# Patient Record
Sex: Female | Born: 1935 | Race: White | Hispanic: No | Smoking: Never smoker
Health system: Southern US, Community
[De-identification: ages and names within clinical notes are randomized; demographics above are authoritative.]

## PROBLEM LIST (undated history)

## (undated) DIAGNOSIS — M75100 Unspecified rotator cuff tear or rupture of unspecified shoulder, not specified as traumatic: Secondary | ICD-10-CM

## (undated) DIAGNOSIS — I4892 Unspecified atrial flutter: Secondary | ICD-10-CM

## (undated) DIAGNOSIS — R1013 Epigastric pain: Secondary | ICD-10-CM

## (undated) DIAGNOSIS — E079 Disorder of thyroid, unspecified: Secondary | ICD-10-CM

## (undated) DIAGNOSIS — I340 Nonrheumatic mitral (valve) insufficiency: Secondary | ICD-10-CM

## (undated) DIAGNOSIS — I150 Renovascular hypertension: Secondary | ICD-10-CM

## (undated) DIAGNOSIS — I519 Heart disease, unspecified: Secondary | ICD-10-CM

## (undated) DIAGNOSIS — I739 Peripheral vascular disease, unspecified: Secondary | ICD-10-CM

## (undated) DIAGNOSIS — D649 Anemia, unspecified: Secondary | ICD-10-CM

## (undated) DIAGNOSIS — K449 Diaphragmatic hernia without obstruction or gangrene: Secondary | ICD-10-CM

## (undated) DIAGNOSIS — N189 Chronic kidney disease, unspecified: Secondary | ICD-10-CM

## (undated) DIAGNOSIS — I639 Cerebral infarction, unspecified: Secondary | ICD-10-CM

## (undated) DIAGNOSIS — I10 Essential (primary) hypertension: Secondary | ICD-10-CM

## (undated) DIAGNOSIS — M1990 Unspecified osteoarthritis, unspecified site: Secondary | ICD-10-CM

## (undated) HISTORY — PX: OTHER SURGICAL HISTORY: SHX169

## (undated) HISTORY — PX: EYE SURGERY: SHX253

## (undated) HISTORY — DX: Peripheral vascular disease, unspecified: I73.9

## (undated) HISTORY — DX: Diaphragmatic hernia without obstruction or gangrene: K44.9

## (undated) HISTORY — PX: CARDIAC CATHETERIZATION: SHX172

## (undated) HISTORY — PX: TONSILLECTOMY: SUR1361

## (undated) HISTORY — DX: Epigastric pain: R10.13

## (undated) HISTORY — DX: Heart disease, unspecified: I51.9

## (undated) HISTORY — DX: Disorder of thyroid, unspecified: E07.9

## (undated) HISTORY — DX: Unspecified osteoarthritis, unspecified site: M19.90

## (undated) HISTORY — DX: Anemia, unspecified: D64.9

## (undated) HISTORY — DX: Nonrheumatic mitral (valve) insufficiency: I34.0

## (undated) HISTORY — PX: CHOLECYSTECTOMY: SHX55

## (undated) HISTORY — PX: OOPHORECTOMY: SHX86

## (undated) HISTORY — PX: ABDOMINAL HYSTERECTOMY: SHX81

## (undated) HISTORY — DX: Cerebral infarction, unspecified: I63.9

## (undated) HISTORY — DX: Chronic kidney disease, unspecified: N18.9

## (undated) HISTORY — DX: Renovascular hypertension: I15.0

## (undated) HISTORY — DX: Essential (primary) hypertension: I10

## (undated) HISTORY — DX: Unspecified atrial flutter: I48.92

## (undated) HISTORY — DX: Unspecified rotator cuff tear or rupture of unspecified shoulder, not specified as traumatic: M75.100

---

## 1997-07-15 ENCOUNTER — Other Ambulatory Visit: Admission: RE | Admit: 1997-07-15 | Discharge: 1997-07-15 | Payer: Self-pay

## 1997-09-17 ENCOUNTER — Other Ambulatory Visit: Admission: RE | Admit: 1997-09-17 | Discharge: 1997-09-17 | Payer: Self-pay | Admitting: Obstetrics and Gynecology

## 1997-09-17 ENCOUNTER — Ambulatory Visit (HOSPITAL_COMMUNITY): Admission: RE | Admit: 1997-09-17 | Discharge: 1997-09-17 | Payer: Self-pay | Admitting: Family Medicine

## 1998-09-22 ENCOUNTER — Ambulatory Visit (HOSPITAL_COMMUNITY): Admission: RE | Admit: 1998-09-22 | Discharge: 1998-09-22 | Payer: Self-pay | Admitting: Obstetrics and Gynecology

## 1998-09-22 ENCOUNTER — Encounter: Payer: Self-pay | Admitting: Obstetrics and Gynecology

## 1998-09-22 ENCOUNTER — Other Ambulatory Visit: Admission: RE | Admit: 1998-09-22 | Discharge: 1998-09-22 | Payer: Self-pay | Admitting: Obstetrics and Gynecology

## 2000-04-02 ENCOUNTER — Encounter: Payer: Self-pay | Admitting: Family Medicine

## 2000-04-02 ENCOUNTER — Ambulatory Visit (HOSPITAL_COMMUNITY): Admission: RE | Admit: 2000-04-02 | Discharge: 2000-04-02 | Payer: Self-pay | Admitting: Family Medicine

## 2000-10-24 ENCOUNTER — Encounter: Payer: Self-pay | Admitting: Gastroenterology

## 2002-04-02 ENCOUNTER — Ambulatory Visit (HOSPITAL_COMMUNITY): Admission: RE | Admit: 2002-04-02 | Discharge: 2002-04-02 | Payer: Self-pay | Admitting: Family Medicine

## 2002-04-02 ENCOUNTER — Encounter: Payer: Self-pay | Admitting: Family Medicine

## 2003-01-17 HISTORY — PX: OTHER SURGICAL HISTORY: SHX169

## 2003-04-21 ENCOUNTER — Ambulatory Visit (HOSPITAL_COMMUNITY): Admission: RE | Admit: 2003-04-21 | Discharge: 2003-04-21 | Payer: Self-pay | Admitting: Internal Medicine

## 2003-05-21 ENCOUNTER — Encounter: Admission: RE | Admit: 2003-05-21 | Discharge: 2003-05-21 | Payer: Self-pay | Admitting: Internal Medicine

## 2003-07-28 ENCOUNTER — Ambulatory Visit (HOSPITAL_COMMUNITY): Admission: RE | Admit: 2003-07-28 | Discharge: 2003-07-29 | Payer: Self-pay | Admitting: Cardiology

## 2003-10-12 ENCOUNTER — Encounter: Payer: Self-pay | Admitting: Gastroenterology

## 2003-12-23 ENCOUNTER — Ambulatory Visit: Payer: Self-pay | Admitting: Internal Medicine

## 2004-01-06 ENCOUNTER — Ambulatory Visit: Payer: Self-pay | Admitting: Internal Medicine

## 2004-01-13 ENCOUNTER — Ambulatory Visit: Payer: Self-pay | Admitting: Internal Medicine

## 2004-01-14 ENCOUNTER — Encounter: Admission: RE | Admit: 2004-01-14 | Discharge: 2004-01-14 | Payer: Self-pay | Admitting: Internal Medicine

## 2004-01-19 ENCOUNTER — Ambulatory Visit: Payer: Self-pay | Admitting: Cardiology

## 2004-02-25 ENCOUNTER — Ambulatory Visit (HOSPITAL_COMMUNITY): Admission: RE | Admit: 2004-02-25 | Discharge: 2004-02-25 | Payer: Self-pay | Admitting: Internal Medicine

## 2004-02-25 ENCOUNTER — Ambulatory Visit: Payer: Self-pay | Admitting: Internal Medicine

## 2004-03-03 ENCOUNTER — Ambulatory Visit: Payer: Self-pay | Admitting: Internal Medicine

## 2004-03-10 ENCOUNTER — Ambulatory Visit: Payer: Self-pay | Admitting: Internal Medicine

## 2004-04-07 ENCOUNTER — Ambulatory Visit: Payer: Self-pay | Admitting: Internal Medicine

## 2004-04-27 ENCOUNTER — Ambulatory Visit (HOSPITAL_COMMUNITY): Admission: RE | Admit: 2004-04-27 | Discharge: 2004-04-27 | Payer: Self-pay | Admitting: Internal Medicine

## 2004-06-10 ENCOUNTER — Ambulatory Visit: Payer: Self-pay | Admitting: Internal Medicine

## 2004-06-29 ENCOUNTER — Ambulatory Visit: Payer: Self-pay | Admitting: Internal Medicine

## 2004-07-05 ENCOUNTER — Ambulatory Visit: Payer: Self-pay | Admitting: Internal Medicine

## 2004-07-27 ENCOUNTER — Ambulatory Visit: Payer: Self-pay | Admitting: Cardiology

## 2004-08-05 ENCOUNTER — Ambulatory Visit: Payer: Self-pay | Admitting: Cardiology

## 2004-09-06 ENCOUNTER — Ambulatory Visit: Payer: Self-pay | Admitting: Cardiology

## 2004-09-13 ENCOUNTER — Ambulatory Visit: Payer: Self-pay | Admitting: Internal Medicine

## 2004-09-22 ENCOUNTER — Ambulatory Visit: Payer: Self-pay | Admitting: Internal Medicine

## 2004-11-01 ENCOUNTER — Ambulatory Visit: Payer: Self-pay | Admitting: Internal Medicine

## 2004-11-17 ENCOUNTER — Ambulatory Visit: Payer: Self-pay | Admitting: Internal Medicine

## 2005-03-31 ENCOUNTER — Ambulatory Visit: Payer: Self-pay | Admitting: Cardiology

## 2005-05-02 ENCOUNTER — Ambulatory Visit: Payer: Self-pay

## 2005-05-20 ENCOUNTER — Ambulatory Visit: Payer: Self-pay | Admitting: Family Medicine

## 2005-05-22 ENCOUNTER — Ambulatory Visit: Payer: Self-pay | Admitting: Internal Medicine

## 2005-06-05 ENCOUNTER — Ambulatory Visit: Payer: Self-pay | Admitting: Internal Medicine

## 2005-06-16 DIAGNOSIS — I639 Cerebral infarction, unspecified: Secondary | ICD-10-CM

## 2005-06-16 HISTORY — DX: Cerebral infarction, unspecified: I63.9

## 2005-06-21 ENCOUNTER — Ambulatory Visit (HOSPITAL_COMMUNITY): Admission: RE | Admit: 2005-06-21 | Discharge: 2005-06-21 | Payer: Self-pay | Admitting: Internal Medicine

## 2005-06-21 ENCOUNTER — Ambulatory Visit: Payer: Self-pay | Admitting: Internal Medicine

## 2005-07-03 ENCOUNTER — Ambulatory Visit: Payer: Self-pay | Admitting: Internal Medicine

## 2005-07-17 ENCOUNTER — Ambulatory Visit: Payer: Self-pay

## 2005-07-19 ENCOUNTER — Inpatient Hospital Stay (HOSPITAL_COMMUNITY): Admission: EM | Admit: 2005-07-19 | Discharge: 2005-07-22 | Payer: Self-pay | Admitting: Emergency Medicine

## 2005-07-20 ENCOUNTER — Ambulatory Visit: Payer: Self-pay | Admitting: Internal Medicine

## 2005-07-20 ENCOUNTER — Encounter (INDEPENDENT_AMBULATORY_CARE_PROVIDER_SITE_OTHER): Payer: Self-pay | Admitting: *Deleted

## 2005-08-29 ENCOUNTER — Ambulatory Visit: Payer: Self-pay | Admitting: Cardiology

## 2005-09-08 ENCOUNTER — Ambulatory Visit: Payer: Self-pay | Admitting: Cardiology

## 2005-09-08 ENCOUNTER — Ambulatory Visit (HOSPITAL_COMMUNITY): Admission: RE | Admit: 2005-09-08 | Discharge: 2005-09-08 | Payer: Self-pay | Admitting: Cardiology

## 2005-11-06 ENCOUNTER — Ambulatory Visit: Payer: Self-pay

## 2005-12-05 ENCOUNTER — Ambulatory Visit: Payer: Self-pay | Admitting: Cardiology

## 2006-04-03 ENCOUNTER — Ambulatory Visit: Payer: Self-pay

## 2006-05-21 ENCOUNTER — Ambulatory Visit: Payer: Self-pay | Admitting: Cardiology

## 2006-05-21 LAB — CONVERTED CEMR LAB
BUN: 41 mg/dL — ABNORMAL HIGH (ref 6–23)
CO2: 23 meq/L (ref 19–32)
Chloride: 108 meq/L (ref 96–112)
Glucose, Bld: 111 mg/dL — ABNORMAL HIGH (ref 70–99)
Sodium: 138 meq/L (ref 135–145)

## 2006-06-14 ENCOUNTER — Ambulatory Visit: Payer: Self-pay | Admitting: Internal Medicine

## 2006-06-21 ENCOUNTER — Ambulatory Visit: Payer: Self-pay

## 2006-06-21 ENCOUNTER — Ambulatory Visit: Payer: Self-pay | Admitting: Cardiology

## 2006-07-17 ENCOUNTER — Ambulatory Visit: Payer: Self-pay

## 2006-07-28 ENCOUNTER — Encounter: Payer: Self-pay | Admitting: Internal Medicine

## 2006-08-07 ENCOUNTER — Ambulatory Visit: Payer: Self-pay | Admitting: Gastroenterology

## 2006-08-07 LAB — CONVERTED CEMR LAB
Folate: >20 ng/mL
Vitamin B-12: 759 pg/mL (ref 211–911)

## 2006-10-04 ENCOUNTER — Encounter: Payer: Self-pay | Admitting: Internal Medicine

## 2006-10-23 ENCOUNTER — Encounter: Payer: Self-pay | Admitting: Internal Medicine

## 2006-10-23 ENCOUNTER — Ambulatory Visit: Payer: Self-pay

## 2006-10-23 ENCOUNTER — Ambulatory Visit: Payer: Self-pay | Admitting: Internal Medicine

## 2006-10-23 ENCOUNTER — Ambulatory Visit: Payer: Self-pay | Admitting: Cardiovascular Disease

## 2006-10-23 DIAGNOSIS — Z8679 Personal history of other diseases of the circulatory system: Secondary | ICD-10-CM | POA: Insufficient documentation

## 2006-10-23 DIAGNOSIS — I10 Essential (primary) hypertension: Secondary | ICD-10-CM | POA: Insufficient documentation

## 2006-10-23 DIAGNOSIS — I2510 Atherosclerotic heart disease of native coronary artery without angina pectoris: Secondary | ICD-10-CM | POA: Insufficient documentation

## 2006-10-25 ENCOUNTER — Ambulatory Visit: Payer: Self-pay | Admitting: Cardiology

## 2006-11-12 ENCOUNTER — Ambulatory Visit: Payer: Self-pay | Admitting: Cardiovascular Disease

## 2006-11-12 LAB — CONVERTED CEMR LAB
Albumin: 3.4 g/dL — ABNORMAL LOW (ref 3.5–5.2)
Alkaline Phosphatase: 82 units/L (ref 39–117)
Direct LDL: 93 mg/dL
HDL: 18.7 mg/dL — ABNORMAL LOW (ref >39.0)
Total Bilirubin: 0.7 mg/dL (ref 0.3–1.2)
Total CHOL/HDL Ratio: 9.4
Total Protein: 7.2 g/dL (ref 6.0–8.3)
Triglycerides: 366 mg/dL (ref 0–149)

## 2006-11-22 ENCOUNTER — Ambulatory Visit: Payer: Self-pay | Admitting: Cardiology

## 2007-01-04 ENCOUNTER — Encounter: Payer: Self-pay | Admitting: Internal Medicine

## 2007-02-14 ENCOUNTER — Ambulatory Visit: Payer: Self-pay | Admitting: Internal Medicine

## 2007-02-14 ENCOUNTER — Ambulatory Visit: Payer: Self-pay | Admitting: Cardiology

## 2007-02-14 DIAGNOSIS — L299 Pruritus, unspecified: Secondary | ICD-10-CM | POA: Insufficient documentation

## 2007-02-14 DIAGNOSIS — I701 Atherosclerosis of renal artery: Secondary | ICD-10-CM | POA: Insufficient documentation

## 2007-02-14 LAB — CONVERTED CEMR LAB
ALT: 19 units/L (ref 0–35)
AST: 21 units/L (ref 0–37)
Bilirubin, Direct: 0.1 mg/dL (ref 0.0–0.3)
Cholesterol: 177 mg/dL (ref 0–200)
Direct LDL: 94.2 mg/dL
HDL: 21 mg/dL — ABNORMAL LOW (ref >39.0)
Total CHOL/HDL Ratio: 8.4
Total Protein: 6.9 g/dL (ref 6.0–8.3)
VLDL: 58 mg/dL — ABNORMAL HIGH (ref 0–40)

## 2007-02-21 ENCOUNTER — Ambulatory Visit: Payer: Self-pay | Admitting: Cardiology

## 2007-02-26 ENCOUNTER — Encounter: Payer: Self-pay | Admitting: Internal Medicine

## 2007-04-17 ENCOUNTER — Ambulatory Visit: Payer: Self-pay

## 2007-04-17 LAB — CONVERTED CEMR LAB
ALT: 13 units/L (ref 0–35)
AST: 18 units/L (ref 0–37)
Bilirubin, Direct: 0.1 mg/dL (ref 0.0–0.3)
Total Bilirubin: 0.6 mg/dL (ref 0.3–1.2)
Total Protein: 7.4 g/dL (ref 6.0–8.3)

## 2007-05-02 ENCOUNTER — Ambulatory Visit: Payer: Self-pay | Admitting: Cardiovascular Disease

## 2007-05-03 DIAGNOSIS — E039 Hypothyroidism, unspecified: Secondary | ICD-10-CM | POA: Insufficient documentation

## 2007-05-03 DIAGNOSIS — K2970 Gastritis, unspecified, without bleeding: Secondary | ICD-10-CM | POA: Insufficient documentation

## 2007-05-03 DIAGNOSIS — K219 Gastro-esophageal reflux disease without esophagitis: Secondary | ICD-10-CM | POA: Insufficient documentation

## 2007-05-03 DIAGNOSIS — K648 Other hemorrhoids: Secondary | ICD-10-CM | POA: Insufficient documentation

## 2007-05-03 DIAGNOSIS — F411 Generalized anxiety disorder: Secondary | ICD-10-CM | POA: Insufficient documentation

## 2007-05-03 DIAGNOSIS — K2990 Gastroduodenitis, unspecified, without bleeding: Secondary | ICD-10-CM

## 2007-05-03 DIAGNOSIS — M1990 Unspecified osteoarthritis, unspecified site: Secondary | ICD-10-CM | POA: Insufficient documentation

## 2007-05-03 DIAGNOSIS — K5730 Diverticulosis of large intestine without perforation or abscess without bleeding: Secondary | ICD-10-CM | POA: Insufficient documentation

## 2007-05-27 ENCOUNTER — Ambulatory Visit: Payer: Self-pay | Admitting: Cardiology

## 2007-05-27 LAB — CONVERTED CEMR LAB
AST: 22 units/L (ref 0–37)
Alkaline Phosphatase: 64 units/L (ref 39–117)
Bilirubin, Direct: 0.1 mg/dL (ref 0.0–0.3)
Total Bilirubin: 0.6 mg/dL (ref 0.3–1.2)
Total CHOL/HDL Ratio: 7.6
VLDL: 44 mg/dL — ABNORMAL HIGH (ref 0–40)

## 2007-05-30 ENCOUNTER — Ambulatory Visit: Payer: Self-pay | Admitting: Internal Medicine

## 2007-07-18 ENCOUNTER — Telehealth: Payer: Self-pay | Admitting: Internal Medicine

## 2007-07-25 ENCOUNTER — Encounter: Payer: Self-pay | Admitting: Internal Medicine

## 2007-08-15 ENCOUNTER — Telehealth: Payer: Self-pay | Admitting: Internal Medicine

## 2007-09-03 ENCOUNTER — Encounter: Payer: Self-pay | Admitting: Internal Medicine

## 2007-09-03 ENCOUNTER — Ambulatory Visit: Payer: Self-pay | Admitting: Internal Medicine

## 2007-09-12 ENCOUNTER — Ambulatory Visit: Payer: Self-pay | Admitting: Cardiology

## 2007-09-16 ENCOUNTER — Telehealth: Payer: Self-pay | Admitting: Internal Medicine

## 2007-09-18 ENCOUNTER — Encounter: Payer: Self-pay | Admitting: Internal Medicine

## 2007-09-18 ENCOUNTER — Ambulatory Visit: Payer: Self-pay | Admitting: Cardiovascular Disease

## 2007-09-18 LAB — CONVERTED CEMR LAB
ALT: 19 units/L (ref 0–35)
Direct LDL: 76.7 mg/dL
HDL: 9.7 mg/dL — ABNORMAL LOW (ref >39.0)
Total Bilirubin: 0.8 mg/dL (ref 0.3–1.2)
Total CHOL/HDL Ratio: 18.9
Triglycerides: 430 mg/dL (ref 0–149)
VLDL: 86 mg/dL — ABNORMAL HIGH (ref 0–40)

## 2007-11-04 ENCOUNTER — Ambulatory Visit: Payer: Self-pay

## 2008-01-06 ENCOUNTER — Telehealth: Payer: Self-pay | Admitting: Internal Medicine

## 2008-01-22 ENCOUNTER — Telehealth: Payer: Self-pay | Admitting: Internal Medicine

## 2008-01-22 ENCOUNTER — Encounter: Payer: Self-pay | Admitting: Internal Medicine

## 2008-02-03 ENCOUNTER — Encounter: Payer: Self-pay | Admitting: Internal Medicine

## 2008-02-04 ENCOUNTER — Encounter: Payer: Self-pay | Admitting: Internal Medicine

## 2008-03-16 ENCOUNTER — Encounter: Payer: Self-pay | Admitting: Internal Medicine

## 2008-03-31 ENCOUNTER — Encounter: Admission: RE | Admit: 2008-03-31 | Discharge: 2008-03-31 | Payer: Self-pay

## 2008-04-16 ENCOUNTER — Ambulatory Visit: Payer: Self-pay

## 2008-04-21 ENCOUNTER — Encounter: Payer: Self-pay | Admitting: Internal Medicine

## 2008-04-24 ENCOUNTER — Encounter: Payer: Self-pay | Admitting: Cardiovascular Disease

## 2008-04-24 ENCOUNTER — Ambulatory Visit: Payer: Self-pay | Admitting: Cardiovascular Disease

## 2008-04-24 DIAGNOSIS — I6529 Occlusion and stenosis of unspecified carotid artery: Secondary | ICD-10-CM

## 2008-04-28 ENCOUNTER — Ambulatory Visit: Payer: Self-pay | Admitting: Internal Medicine

## 2008-04-28 DIAGNOSIS — L439 Lichen planus, unspecified: Secondary | ICD-10-CM

## 2008-05-05 ENCOUNTER — Ambulatory Visit: Payer: Self-pay | Admitting: Internal Medicine

## 2008-05-05 LAB — CONVERTED CEMR LAB
AST: 21 units/L (ref 0–37)
Alkaline Phosphatase: 80 units/L (ref 39–117)
Bilirubin, Direct: 0.1 mg/dL (ref 0.0–0.3)
Total Bilirubin: 0.7 mg/dL (ref 0.3–1.2)

## 2008-05-08 ENCOUNTER — Telehealth: Payer: Self-pay | Admitting: Internal Medicine

## 2008-05-10 ENCOUNTER — Encounter: Payer: Self-pay | Admitting: Internal Medicine

## 2008-05-11 ENCOUNTER — Telehealth: Payer: Self-pay | Admitting: Internal Medicine

## 2008-05-14 ENCOUNTER — Encounter: Payer: Self-pay | Admitting: Internal Medicine

## 2008-05-20 ENCOUNTER — Encounter: Payer: Self-pay | Admitting: Internal Medicine

## 2008-05-26 ENCOUNTER — Encounter: Payer: Self-pay | Admitting: Internal Medicine

## 2008-05-26 ENCOUNTER — Telehealth: Payer: Self-pay | Admitting: Internal Medicine

## 2008-05-26 ENCOUNTER — Encounter: Payer: Self-pay | Admitting: Endocrinology

## 2008-06-01 ENCOUNTER — Encounter: Payer: Self-pay | Admitting: Cardiovascular Disease

## 2008-06-01 ENCOUNTER — Encounter: Payer: Self-pay | Admitting: Internal Medicine

## 2008-06-03 ENCOUNTER — Telehealth (INDEPENDENT_AMBULATORY_CARE_PROVIDER_SITE_OTHER): Payer: Self-pay | Admitting: *Deleted

## 2008-06-04 ENCOUNTER — Telehealth: Payer: Self-pay | Admitting: Cardiovascular Disease

## 2008-06-08 ENCOUNTER — Telehealth: Payer: Self-pay | Admitting: Internal Medicine

## 2008-06-10 ENCOUNTER — Ambulatory Visit: Payer: Self-pay | Admitting: Endocrinology

## 2008-06-10 DIAGNOSIS — R609 Edema, unspecified: Secondary | ICD-10-CM

## 2008-06-10 LAB — CONVERTED CEMR LAB
BUN: 37 mg/dL — ABNORMAL HIGH (ref 6–23)
CO2: 27 meq/L (ref 19–32)
Calcium: 8.9 mg/dL (ref 8.4–10.5)
Glucose, Bld: 85 mg/dL (ref 70–99)
Sodium: 141 meq/L (ref 135–145)

## 2008-06-22 ENCOUNTER — Encounter: Payer: Self-pay | Admitting: Internal Medicine

## 2008-06-24 ENCOUNTER — Ambulatory Visit: Payer: Self-pay | Admitting: Endocrinology

## 2008-06-30 ENCOUNTER — Encounter: Payer: Self-pay | Admitting: Endocrinology

## 2008-07-07 ENCOUNTER — Ambulatory Visit: Payer: Self-pay | Admitting: Endocrinology

## 2008-07-07 LAB — CONVERTED CEMR LAB
BUN: 53 mg/dL — ABNORMAL HIGH (ref 6–23)
GFR calc non Af Amer: 37.85 mL/min (ref >60)
Glucose, Bld: 118 mg/dL — ABNORMAL HIGH (ref 70–99)
Potassium: 4.5 meq/L (ref 3.5–5.1)

## 2008-07-10 ENCOUNTER — Telehealth (INDEPENDENT_AMBULATORY_CARE_PROVIDER_SITE_OTHER): Payer: Self-pay | Admitting: *Deleted

## 2008-07-10 ENCOUNTER — Telehealth: Payer: Self-pay | Admitting: Internal Medicine

## 2008-07-14 ENCOUNTER — Ambulatory Visit: Payer: Self-pay | Admitting: Internal Medicine

## 2008-07-14 DIAGNOSIS — R42 Dizziness and giddiness: Secondary | ICD-10-CM | POA: Insufficient documentation

## 2008-07-27 ENCOUNTER — Telehealth: Payer: Self-pay | Admitting: Internal Medicine

## 2008-08-10 ENCOUNTER — Ambulatory Visit: Payer: Self-pay | Admitting: Internal Medicine

## 2008-08-10 ENCOUNTER — Telehealth: Payer: Self-pay | Admitting: Internal Medicine

## 2008-08-24 ENCOUNTER — Telehealth: Payer: Self-pay | Admitting: Internal Medicine

## 2008-09-08 ENCOUNTER — Ambulatory Visit: Payer: Self-pay | Admitting: Cardiovascular Disease

## 2008-09-08 ENCOUNTER — Telehealth (INDEPENDENT_AMBULATORY_CARE_PROVIDER_SITE_OTHER): Payer: Self-pay | Admitting: *Deleted

## 2008-09-08 ENCOUNTER — Encounter (INDEPENDENT_AMBULATORY_CARE_PROVIDER_SITE_OTHER): Payer: Self-pay | Admitting: *Deleted

## 2008-10-16 ENCOUNTER — Ambulatory Visit: Payer: Self-pay | Admitting: Cardiovascular Disease

## 2008-10-31 ENCOUNTER — Emergency Department (HOSPITAL_COMMUNITY): Admission: EM | Admit: 2008-10-31 | Discharge: 2008-10-31 | Payer: Self-pay | Admitting: Emergency Medicine

## 2008-11-16 ENCOUNTER — Encounter: Payer: Self-pay | Admitting: Internal Medicine

## 2008-11-27 ENCOUNTER — Encounter: Payer: Self-pay | Admitting: Cardiovascular Disease

## 2008-11-27 ENCOUNTER — Ambulatory Visit: Payer: Self-pay

## 2008-12-08 ENCOUNTER — Telehealth: Payer: Self-pay | Admitting: Cardiovascular Disease

## 2008-12-08 ENCOUNTER — Ambulatory Visit: Payer: Self-pay | Admitting: Internal Medicine

## 2008-12-09 ENCOUNTER — Encounter: Payer: Self-pay | Admitting: Internal Medicine

## 2008-12-15 ENCOUNTER — Telehealth: Payer: Self-pay | Admitting: Cardiovascular Disease

## 2008-12-15 ENCOUNTER — Ambulatory Visit: Payer: Self-pay | Admitting: Cardiovascular Disease

## 2009-01-05 ENCOUNTER — Ambulatory Visit: Payer: Self-pay | Admitting: Cardiovascular Disease

## 2009-01-20 ENCOUNTER — Telehealth: Payer: Self-pay | Admitting: Gastroenterology

## 2009-01-21 ENCOUNTER — Ambulatory Visit: Payer: Self-pay | Admitting: Internal Medicine

## 2009-01-21 DIAGNOSIS — R634 Abnormal weight loss: Secondary | ICD-10-CM

## 2009-01-21 DIAGNOSIS — R141 Gas pain: Secondary | ICD-10-CM | POA: Insufficient documentation

## 2009-01-21 DIAGNOSIS — R1033 Periumbilical pain: Secondary | ICD-10-CM | POA: Insufficient documentation

## 2009-01-21 DIAGNOSIS — R142 Eructation: Secondary | ICD-10-CM

## 2009-01-21 DIAGNOSIS — R143 Flatulence: Secondary | ICD-10-CM

## 2009-01-27 ENCOUNTER — Telehealth (INDEPENDENT_AMBULATORY_CARE_PROVIDER_SITE_OTHER): Payer: Self-pay | Admitting: *Deleted

## 2009-02-04 ENCOUNTER — Ambulatory Visit: Payer: Self-pay | Admitting: Cardiovascular Disease

## 2009-02-05 ENCOUNTER — Telehealth: Payer: Self-pay | Admitting: Gastroenterology

## 2009-02-05 ENCOUNTER — Telehealth: Payer: Self-pay | Admitting: Cardiovascular Disease

## 2009-02-05 ENCOUNTER — Encounter: Payer: Self-pay | Admitting: Gastroenterology

## 2009-02-07 ENCOUNTER — Encounter: Payer: Self-pay | Admitting: Gastroenterology

## 2009-02-08 ENCOUNTER — Encounter: Payer: Self-pay | Admitting: Gastroenterology

## 2009-02-09 ENCOUNTER — Ambulatory Visit: Payer: Self-pay | Admitting: Gastroenterology

## 2009-02-09 ENCOUNTER — Telehealth (INDEPENDENT_AMBULATORY_CARE_PROVIDER_SITE_OTHER): Payer: Self-pay | Admitting: *Deleted

## 2009-02-15 ENCOUNTER — Telehealth: Payer: Self-pay | Admitting: Gastroenterology

## 2009-03-02 ENCOUNTER — Ambulatory Visit: Payer: Self-pay | Admitting: Cardiovascular Disease

## 2009-03-04 ENCOUNTER — Telehealth: Payer: Self-pay | Admitting: Cardiovascular Disease

## 2009-03-05 ENCOUNTER — Telehealth: Payer: Self-pay | Admitting: Cardiovascular Disease

## 2009-03-08 ENCOUNTER — Inpatient Hospital Stay (HOSPITAL_COMMUNITY): Admission: AD | Admit: 2009-03-08 | Discharge: 2009-03-11 | Payer: Self-pay | Admitting: Cardiovascular Disease

## 2009-03-08 ENCOUNTER — Ambulatory Visit: Payer: Self-pay | Admitting: Cardiovascular Disease

## 2009-03-09 ENCOUNTER — Encounter: Payer: Self-pay | Admitting: Cardiovascular Disease

## 2009-03-12 ENCOUNTER — Telehealth: Payer: Self-pay | Admitting: Cardiovascular Disease

## 2009-03-12 ENCOUNTER — Telehealth: Payer: Self-pay | Admitting: Gastroenterology

## 2009-03-13 ENCOUNTER — Inpatient Hospital Stay (HOSPITAL_COMMUNITY): Admission: EM | Admit: 2009-03-13 | Discharge: 2009-03-19 | Payer: Self-pay | Admitting: Emergency Medicine

## 2009-03-13 ENCOUNTER — Ambulatory Visit: Payer: Self-pay | Admitting: Internal Medicine

## 2009-03-14 ENCOUNTER — Ambulatory Visit: Payer: Self-pay | Admitting: Surgery

## 2009-03-15 ENCOUNTER — Ambulatory Visit: Payer: Self-pay | Admitting: Gastroenterology

## 2009-03-15 ENCOUNTER — Encounter (INDEPENDENT_AMBULATORY_CARE_PROVIDER_SITE_OTHER): Payer: Self-pay | Admitting: Internal Medicine

## 2009-03-16 DIAGNOSIS — I4892 Unspecified atrial flutter: Secondary | ICD-10-CM | POA: Insufficient documentation

## 2009-03-16 HISTORY — DX: Unspecified atrial flutter: I48.92

## 2009-03-20 ENCOUNTER — Encounter: Payer: Self-pay | Admitting: Internal Medicine

## 2009-03-21 ENCOUNTER — Encounter: Payer: Self-pay | Admitting: Internal Medicine

## 2009-03-22 ENCOUNTER — Encounter: Payer: Self-pay | Admitting: Internal Medicine

## 2009-03-22 ENCOUNTER — Telehealth: Payer: Self-pay | Admitting: Cardiovascular Disease

## 2009-03-23 ENCOUNTER — Telehealth: Payer: Self-pay | Admitting: Cardiovascular Disease

## 2009-03-29 ENCOUNTER — Ambulatory Visit: Payer: Self-pay | Admitting: Cardiovascular Disease

## 2009-03-31 ENCOUNTER — Encounter: Payer: Self-pay | Admitting: Internal Medicine

## 2009-04-05 ENCOUNTER — Encounter: Payer: Self-pay | Admitting: Internal Medicine

## 2009-04-06 ENCOUNTER — Ambulatory Visit: Payer: Self-pay | Admitting: Internal Medicine

## 2009-04-06 ENCOUNTER — Telehealth: Payer: Self-pay | Admitting: Cardiovascular Disease

## 2009-04-06 DIAGNOSIS — K633 Ulcer of intestine: Secondary | ICD-10-CM

## 2009-04-06 DIAGNOSIS — K551 Chronic vascular disorders of intestine: Secondary | ICD-10-CM

## 2009-04-06 DIAGNOSIS — K209 Esophagitis, unspecified: Secondary | ICD-10-CM

## 2009-04-07 ENCOUNTER — Encounter: Payer: Self-pay | Admitting: Internal Medicine

## 2009-04-12 ENCOUNTER — Ambulatory Visit: Payer: Self-pay | Admitting: Surgery

## 2009-04-12 ENCOUNTER — Encounter: Payer: Self-pay | Admitting: Nurse Practitioner

## 2009-04-13 ENCOUNTER — Ambulatory Visit: Payer: Self-pay | Admitting: Internal Medicine

## 2009-04-20 ENCOUNTER — Telehealth: Payer: Self-pay | Admitting: Cardiovascular Disease

## 2009-04-21 ENCOUNTER — Encounter: Payer: Self-pay | Admitting: Internal Medicine

## 2009-04-27 ENCOUNTER — Encounter: Payer: Self-pay | Admitting: Internal Medicine

## 2009-05-04 ENCOUNTER — Telehealth (INDEPENDENT_AMBULATORY_CARE_PROVIDER_SITE_OTHER): Payer: Self-pay | Admitting: *Deleted

## 2009-05-05 ENCOUNTER — Telehealth (INDEPENDENT_AMBULATORY_CARE_PROVIDER_SITE_OTHER): Payer: Self-pay | Admitting: *Deleted

## 2009-05-05 ENCOUNTER — Telehealth: Payer: Self-pay | Admitting: Cardiovascular Disease

## 2009-05-06 ENCOUNTER — Ambulatory Visit: Payer: Self-pay | Admitting: Cardiovascular Disease

## 2009-05-13 ENCOUNTER — Encounter: Payer: Self-pay | Admitting: Internal Medicine

## 2009-05-25 ENCOUNTER — Encounter: Payer: Self-pay | Admitting: Internal Medicine

## 2009-06-02 ENCOUNTER — Encounter: Payer: Self-pay | Admitting: Internal Medicine

## 2009-06-03 ENCOUNTER — Ambulatory Visit: Payer: Self-pay | Admitting: Cardiovascular Disease

## 2009-06-03 ENCOUNTER — Encounter: Payer: Self-pay | Admitting: Internal Medicine

## 2009-06-18 ENCOUNTER — Ambulatory Visit: Payer: Self-pay | Admitting: Cardiovascular Disease

## 2009-06-29 ENCOUNTER — Telehealth: Payer: Self-pay | Admitting: Cardiovascular Disease

## 2009-07-27 ENCOUNTER — Ambulatory Visit: Payer: Self-pay | Admitting: Cardiovascular Disease

## 2009-09-09 ENCOUNTER — Ambulatory Visit: Payer: Self-pay | Admitting: Cardiovascular Disease

## 2009-10-01 ENCOUNTER — Telehealth: Payer: Self-pay | Admitting: Gastroenterology

## 2009-10-05 ENCOUNTER — Ambulatory Visit: Payer: Self-pay | Admitting: Internal Medicine

## 2009-10-05 DIAGNOSIS — R1013 Epigastric pain: Secondary | ICD-10-CM

## 2009-10-27 ENCOUNTER — Ambulatory Visit: Payer: Self-pay

## 2009-10-27 ENCOUNTER — Encounter: Payer: Self-pay | Admitting: Cardiovascular Disease

## 2009-11-05 ENCOUNTER — Telehealth: Payer: Self-pay | Admitting: Cardiovascular Disease

## 2009-11-05 ENCOUNTER — Ambulatory Visit: Payer: Self-pay | Admitting: Cardiovascular Disease

## 2009-11-08 ENCOUNTER — Telehealth: Payer: Self-pay | Admitting: Cardiovascular Disease

## 2009-11-11 ENCOUNTER — Telehealth: Payer: Self-pay | Admitting: Cardiovascular Disease

## 2009-11-18 ENCOUNTER — Telehealth: Payer: Self-pay | Admitting: Cardiovascular Disease

## 2009-11-29 ENCOUNTER — Ambulatory Visit: Payer: Self-pay | Admitting: Surgery

## 2009-11-29 ENCOUNTER — Encounter: Payer: Self-pay | Admitting: Cardiovascular Disease

## 2009-12-01 ENCOUNTER — Encounter: Payer: Self-pay | Admitting: Internal Medicine

## 2009-12-01 ENCOUNTER — Encounter (INDEPENDENT_AMBULATORY_CARE_PROVIDER_SITE_OTHER): Payer: Self-pay | Admitting: *Deleted

## 2009-12-21 ENCOUNTER — Ambulatory Visit: Payer: Self-pay | Admitting: Cardiovascular Disease

## 2009-12-24 ENCOUNTER — Telehealth: Payer: Self-pay | Admitting: Cardiovascular Disease

## 2010-01-21 ENCOUNTER — Ambulatory Visit: Admission: RE | Admit: 2010-01-21 | Discharge: 2010-01-21 | Payer: Self-pay | Source: Home / Self Care

## 2010-01-21 ENCOUNTER — Ambulatory Visit
Admission: RE | Admit: 2010-01-21 | Discharge: 2010-01-21 | Payer: Self-pay | Source: Home / Self Care | Attending: Gastroenterology | Admitting: Gastroenterology

## 2010-01-21 DIAGNOSIS — N189 Chronic kidney disease, unspecified: Secondary | ICD-10-CM | POA: Insufficient documentation

## 2010-01-21 DIAGNOSIS — K589 Irritable bowel syndrome without diarrhea: Secondary | ICD-10-CM | POA: Insufficient documentation

## 2010-01-21 DIAGNOSIS — K222 Esophageal obstruction: Secondary | ICD-10-CM | POA: Insufficient documentation

## 2010-01-21 LAB — CONVERTED CEMR LAB
INR: 1.9
POC INR: 1.9

## 2010-01-24 ENCOUNTER — Encounter: Payer: Self-pay | Admitting: Internal Medicine

## 2010-01-24 ENCOUNTER — Ambulatory Visit: Admit: 2010-01-24 | Payer: Self-pay | Admitting: Surgery

## 2010-01-24 ENCOUNTER — Ambulatory Visit: Admission: RE | Admit: 2010-01-24 | Discharge: 2010-01-24 | Payer: Self-pay | Source: Home / Self Care

## 2010-01-24 ENCOUNTER — Ambulatory Visit
Admission: RE | Admit: 2010-01-24 | Discharge: 2010-01-24 | Payer: Self-pay | Source: Home / Self Care | Attending: Surgery | Admitting: Surgery

## 2010-01-24 LAB — CONVERTED CEMR LAB: INR: 2.6

## 2010-02-08 ENCOUNTER — Inpatient Hospital Stay (HOSPITAL_COMMUNITY)
Admission: RE | Admit: 2010-02-08 | Discharge: 2010-02-10 | Payer: Self-pay | Source: Home / Self Care | Attending: Surgery | Admitting: Surgery

## 2010-02-09 LAB — POCT I-STAT, CHEM 8
Calcium, Ion: 1.08 mmol/L — ABNORMAL LOW (ref 1.12–1.32)
Creatinine, Ser: 1.9 mg/dL — ABNORMAL HIGH (ref 0.4–1.2)
Glucose, Bld: 106 mg/dL — ABNORMAL HIGH (ref 70–99)
HCT: 36 % (ref 36.0–46.0)
Hemoglobin: 12.2 g/dL (ref 12.0–15.0)
TCO2: 28 mmol/L (ref 0–100)

## 2010-02-09 LAB — CBC
MCH: 27.2 pg (ref 26.0–34.0)
Platelets: 288 10*3/uL (ref 150–400)
RDW: 15 % (ref 11.5–15.5)

## 2010-02-09 LAB — PROTIME-INR
INR: 1.2 (ref 0.00–1.49)
Prothrombin Time: 15.4 seconds — ABNORMAL HIGH (ref 11.6–15.2)

## 2010-02-09 LAB — BASIC METABOLIC PANEL
Calcium: 8.2 mg/dL — ABNORMAL LOW (ref 8.4–10.5)
Chloride: 109 mEq/L (ref 96–112)
GFR calc Af Amer: 39 mL/min — ABNORMAL LOW (ref >60)
Potassium: 4.2 mEq/L (ref 3.5–5.1)

## 2010-02-10 LAB — CBC
HCT: 25.2 % — ABNORMAL LOW (ref 36.0–46.0)
Hemoglobin: 7.9 g/dL — ABNORMAL LOW (ref 12.0–15.0)
MCH: 27 pg (ref 26.0–34.0)
MCV: 86 fL (ref 78.0–100.0)
Platelets: 300 10*3/uL (ref 150–400)
RDW: 15 % (ref 11.5–15.5)

## 2010-02-10 LAB — POCT ACTIVATED CLOTTING TIME: Activated Clotting Time: 211 seconds

## 2010-02-14 ENCOUNTER — Ambulatory Visit: Admit: 2010-02-14 | Payer: Self-pay | Admitting: Surgery

## 2010-02-15 ENCOUNTER — Ambulatory Visit: Admit: 2010-02-15 | Payer: Self-pay

## 2010-02-15 NOTE — Progress Notes (Signed)
  Phone Note Outgoing Call   Call placed by: Dessie Coma LPN Call placed to: Patient Summary of Call: Patient notified per Dr. Freida Busman, Creatinine is high so need to hold Tekturna for 1 week and recheck BMP.  Patient voiced understanding.

## 2010-02-15 NOTE — Progress Notes (Signed)
Summary: Divertuiculitis pain  Phone Note Call from Patient Call back at Home Phone [phone]   Call For: Dr Jarold Motto Reason for Call: Talk to Nurse Summary of Call: Diverticulitis is causing so much pain she is ibuprofin every 4hrs which helps temporarily. What else can she do? Initial call taken by: Leanor Kail [facility],  March 12, 2009 2:41 PM  Follow-up for Phone Call        Pt states she is having abd pain which is worse now than it has vere been.  Tylenol does give her some relief.  Pt states she has not tried the levsin that was given for abd pain.  She forgot she has it.  Encouraged pt to take levsin.  Explained to pt that this med is for spasms and abd pain.  Any other suggestions for pt? Follow-up by: Ashok Cordia RN,  March 12, 2009 3:18 PM  Additional Follow-up for Phone Call Additional follow up Details #1::        FRIDAY..4:00...???...JUST DISCHARGED...SHOULD CALL HER MD  WHO IS TAKING CARE OF HER FOR CONTINUING CARE AND PROBLEMS IMMEDIATE POST D/C... Additional Follow-up by: Mardella Layman MD FACG,  March 12, 2009 3:35 PM    Additional Follow-up for Phone Call Additional follow up Details #2::    Lm for pt.  Lupita Leash Surface RN  March 12, 2009 4:44 PM   Pt is in hospital at [facility].  Admitted 03/12/09 pm with atril flutter and abd pain. Follow-up by: Ashok Cordia RN,  March 15, 2009 10:32 AM

## 2010-02-15 NOTE — Progress Notes (Signed)
Summary: REFILL  Phone Note Refill Request Message from:  Fax from Pharmacy on November 08, 2009 4:38 PM  Refills Requested: Medication #1:  AMIODARONE HCL 200 MG TABS 1 once daily please Advise refills  Initial call taken by: Ami Bullins CMA,  November 08, 2009 4:39 PM  Follow-up for Phone Call        forward to Dr. Kirke Corin- Efraim Kaufmann Cards Follow-up by: Jacques Navy MD,  November 08, 2009 5:34 PM  Additional Follow-up for Phone Call Additional follow up Details #1::        Rx called to pharmacy Additional Follow-up by: Dessie Coma  LPN,  November 11, 2009 2:01 PM

## 2010-02-15 NOTE — Assessment & Plan Note (Signed)
Summary: Abd pain/dfs   History of Present Illness Visit Type: follow up  Primary GI MD: Sheryn Bison MD FACP FAGA Primary Provider: Illene Regulus, MD  Requesting Provider: n/a Chief Complaint: F/u from hosp visit with lower abd pain. ? of diverticulosis History of Present Illness:   This patient 77 year old white female with peripheral vascular disease, renal artery stenosis requiring stenting, hypertensive cardiovascular disease, coronary artery disease, previous CVAs, hypothyroidism, previous common bile duct stone in 1992 removed with ERCP followed by cholecystectomy, total abdominal hysterectomy removal of ovaries and degenerative arthritis. She has a long history of chronic abdominal pain with gas and bloating and has responded in the past to treatment for presumed bacterial overgrowth syndrome.  She was hospitalized over the weekend with what sounds like acute diverticulitis and responded to intravenous antibiotics and is currently on p.o. cephalosporins. Her lower abdominal pain is resolved but she continues with gas and bloating. He's had no change in bowel habits, melena or hematochezia. We have no labs or x-ray results for review at this time. Patient denies chronic pain consistent with ischemic bowel problems and is followed closely by cardiology and aspirin. Her primary care physician is Dr. Illene Regulus. There is some recent history as to possible glucose intolerance. We have requested x-rays and labs for review.   GI Review of Systems      Denies abdominal pain, acid reflux, belching, bloating, chest pain, dysphagia with liquids, dysphagia with solids, heartburn, loss of appetite, nausea, vomiting, vomiting blood, weight loss, and  weight gain.        Denies anal fissure, black tarry stools, change in bowel habit, constipation, diarrhea, diverticulosis, fecal incontinence, heme positive stool, hemorrhoids, irritable bowel syndrome, jaundice, light color stool, liver  problems, rectal bleeding, and  rectal pain.    Current Medications (verified): 1)  Levothyroxine Sodium 75 Mcg  Tabs (Levothyroxine Sodium) .... Take One Tab By By Mouth Daily 2)  Prilosec 20 Mg  Cpdr (Omeprazole) .... Take One Tab By Mouth Daily 3)  Multivitamins   Tabs (Multiple Vitamin) .... Take 1 Tablet By Mouth Once A Day 4)  Eq Aspirin 325 Mg Tabs (Aspirin) .Marland Kitchen.. 1 By Mouth Once Daily For Stroke Prevention. 5)  Diltiazem Hcl Er Beads 360 Mg Xr24h-Cap (Diltiazem Hcl Er Beads) .Marland Kitchen.. 1 Once Daily 6)  Furosemide 80 Mg Tabs (Furosemide) .Marland Kitchen.. 1 Tab Once A Day 7)  Hydralazine Hcl 10 Mg Tabs (Hydralazine Hcl) .... Take One Tablet By Mouth Three Times A Day 8)  Boost  Liqd (Nutritional Supplements) .... One Daily 9)  Hydrocodone-Acetaminophen 5-500 Mg Tabs (Hydrocodone-Acetaminophen) .... 1/2 To 1 Tablet By Mouth As Needed For Pain 10)  Hydroxychloroquine Sulfate 200 Mg Tabs (Hydroxychloroquine Sulfate) .... One Tablet By Mouth Once Daily 11)  Hydralazine Hcl 20 Mg/ml Soln (Hydralazine Hcl) .... Take One Tablet By Mouth Three Times A Day  Allergies (verified): 1)  ! Sulfa 2)  ! Augmentin 3)  Sulfamethoxazole (Sulfamethoxazole) 4)  Sulfamethoxazole (Sulfamethoxazole) 5)  Sulfamethoxazole (Sulfamethoxazole)  Past History:  Past medical, surgical, family and social histories (including risk factors) reviewed for relevance to current acute and chronic problems.  Past Medical History: Reviewed history from 01/21/2009 and no changes required. CORONARY ARTERY DISEASE (ICD-414.00) HYPERTENSION (ICD-401.9) CEREBROVASCULAR ACCIDENT, HX OF (ICD-V12.50) HYPERTRIGLYCERIDEMIA, SEVERE (ICD-272.4) RENAL ARTERY STENOSIS (ICD-440.1) GASTROESOPHAGEAL REFLUX DISEASE (ICD-530.81) DIVERTICULOSIS, COLON (ICD-562.10) GASTRITIS (ICD-535.50) ANXIETY (ICD-300.00) HYPOTHYROIDISM (ICD-244.9) OSTEOARTHRITIS (ICD-715.90) PRURITUS (ICD-698.9) HEMORRHOIDS, INTERNAL (ICD-455.0) LICHEN PLANUS UCD except  mumps scarlet fever arthritis knees and back Physician Roster:  Card/PV - Dr. Excell Seltzer          Renal - Dr. Eliott Nine  Past Surgical History: Reviewed history from 04/23/2008 and no changes required. Cholecystectomy-laproscopic Hysterectomy Oophorectomy Tonsillectomy RAS-stents....bilateral June 2005  Family History: Reviewed history from 01/21/2009 and no changes required. Family History of Coronary Artery Disease:  Mother died age 61 Father: suicide.Marland Kitchen Hx of HTN Family History of Hypertension:  2 Brothers Family History of Diabetes: 1 brother Siblings: sister suicide due to postpartum No FH of Colon Cancer:  Social History: Reviewed history from 02/14/2007 and no changes required. lives alone. I-ADL's.  married 60 yrs, widowed May 28,'07 2 miscarriages, 2 sons: '50, '53 1 daughter: '48 7 grandchildren  Review of Systems  The patient denies allergy/sinus, anemia, anxiety-new, arthritis/joint pain, back pain, blood in urine, breast changes/lumps, change in vision, confusion, cough, coughing up blood, depression-new, fainting, fatigue, fever, headaches-new, hearing problems, heart murmur, heart rhythm changes, itching, menstrual pain, muscle pains/cramps, night sweats, nosebleeds, pregnancy symptoms, shortness of breath, skin rash, sleeping problems, sore throat, swelling of feet/legs, swollen lymph glands, thirst - excessive , urination - excessive , urination changes/pain, urine leakage, vision changes, and voice change.    Vital Signs:  Patient profile:   77 year old female Height:      63 inches Weight:      121 pounds BSA:     1.56 Pulse rate:   64 / minute Pulse rhythm:   regular BP sitting:   128 / 60  (left arm) Cuff size:   regular  Vitals Entered By: Ok Anis CMA (February 09, 2009 9:51 AM)  Physical Exam  General:  Well developed, well nourished, no acute distress.healthy appearing.   Head:  Normocephalic and atraumatic. Eyes:  PERRLA, no  icterus.exam deferred to patient's ophthalmologist.   Lungs:  Clear throughout to auscultation. Heart:  irregular heart beat with very loud heart sounds but no S3 gallop Abdomen:  There is no abdominal distention, masses or tenderness at this time. There is no epigastric bruit noted. Rectal exam is deferred Extremities:  No clubbing, cyanosis, edema or deformities noted. Neurologic:  Alert and  oriented x4;  grossly normal neurologically. Psych:  Alert and cooperative. Normal mood and affect.   Impression & Recommendations:  Problem # 1:  DIVERTICULOSIS, COLON (ICD-562.10) Assessment Improved Her symptoms seem most consistent with resolving subacute diverticulitis. I've asked continue her oral antibiotics and we will review her records once available. Also we will decide if she needs followup colonoscopy or not. This was done many years ago.  Problem # 2:  FLATULENCE-GAS-BLOATING (ICD-787.3) Assessment: Unchanged She denies lactose intolerance, use of sorbitol or fructose. I placed her on low gas diet and will prescribe probiotics with daily Align. It is certain possible that this patient has chronic low grade mesenteric ischemia, but apparently CT scans have not shown any critical mesenteric vascular stenosis.  Problem # 3:  CAROTID ARTERY STENOSIS, WITHOUT INFARCTION (ICD-433.10) Assessment: Unchanged continue all medications as listed and reviewed in her chart including daily aspirin and Prilosec prophylaxis.  Problem # 4:  HYPERTENSION (ICD-401.9) Assessment: Improved blood pressure today is 128/60. She does not have a regular pulse.  Problem # 5:  GASTROESOPHAGEAL REFLUX DISEASE (ICD-530.81) Assessment: Improved Continue daily PPI therapy.  Problem # 6:  ENCOUNTER FOR LONG-TERM USE OF OTHER MEDICATIONS (ICD-V58.69) Assessment: Unchanged She is to make an appointment to see Dr. Debby Bud for review of her multiple problems and possible new onset diabetes.  Patient  Instructions: 1)  Copy sent to : Dr. Illene Regulus and Dr. Freida Busman in [place] 2)  Please continue current medications.  3)  Align probiotic therapy 4)  Continue p.o. cephalosporin for 5 more days 5)  Excessive Gas Diet handout given.  6)  records requested for review including CT scan 7)  The medication list was reviewed and reconciled.  All changed / newly prescribed medications were explained.  A complete medication list was provided to the patient / caregiver.  Appended Document: Abd pain/dfs    Clinical Lists Changes  Medications: Added new medication of ALIGN   CAPS (MISC INTESTINAL FLORA REGULAT) Take one capsule by mouth daily for one month

## 2010-02-15 NOTE — Letter (Signed)
Summary: [facility]   Imported By: Sherian Rein 02/17/2009 12:23:19  _____________________________________________________________________  External Attachment:    Type:   Image     Comment:   External Document

## 2010-02-15 NOTE — Miscellaneous (Signed)
Summary: Discharge/Home Health [facility]  Discharge/Home Health [facility]   Imported By: Sherian Rein 04/27/2009 10:46:41  _____________________________________________________________________  External Attachment:    Type:   Image     Comment:   External Document

## 2010-02-15 NOTE — Progress Notes (Signed)
  Phone Note Outgoing Call   Call placed by: Dessie Coma LPN Call placed to: Patient Summary of Call: T.C. from Windell Moulding at [facility]. re: patient's holter..States patient showing VTach 6-beat run and possible atrial fib but will fax report..Dr. Freida Busman paged and notified of this..Per Dr. Freida Busman, to call patient and see how she is feeling and if feels bad need to go to ED.T.C.to patient and she states she is feeling OK except for some abdominal pain she has been having but otherwise is OK.Marland Kitchen

## 2010-02-15 NOTE — Progress Notes (Signed)
Summary: INR results  Phone Note Outgoing Call   Call placed by: Dessie Coma  LPN,  November 18, 2009 12:31 PM Call placed to: Patient Summary of Call: LMVM-patient to call back.  INR 4.9-per Dr. Kirke Corin, to hold Coumadin for 2 days then resume at 2mg  at bedtime.  To recheck INR in one week.   Follow-up for Phone Call        Patient called back and notified per Dr. Kirke Corin, INR was elevated.  Need to hold Coumadin for 2 nights then resume at 2mg  at bedtime.  To recheck INR in one week.  Follow-up by: Dessie Coma  LPN,  November 18, 2009 4:41 PM

## 2010-02-15 NOTE — Letter (Signed)
Summary: [facility]  [facility]   Imported By: Sherian Rein 04/09/2009 10:32:38  _____________________________________________________________________  External Attachment:    Type:   Image     Comment:   External Document

## 2010-02-15 NOTE — Progress Notes (Signed)
Summary: Med List  Med List   Imported By: Roderic Ovens 04/23/2009 11:18:11  _____________________________________________________________________  External Attachment:    Type:   Image     Comment:   External Document

## 2010-02-15 NOTE — Progress Notes (Signed)
  Phone Note Call from Patient   Caller: Clydie Braun with [facility] Summary of Call: T. C. from Clydie Braun with [facility]-patient's weight up to 119.6 from 113 since stopping evening dose of Lasix.  BPs in Am 177-194/72-88.  Has some peripheral edema.  Per Dr. Freida Busman, to add Lasix 20mg  Q pm in addition to 40mg  in Am.  To recheck K level in one week.      Initial call taken by: Dessie Coma LPN

## 2010-02-15 NOTE — Miscellaneous (Signed)
Summary: Orders Update  Clinical Lists Changes  Orders: Added new Test order of Mesenteric (Mesenteric) - Signed 

## 2010-02-15 NOTE — Miscellaneous (Signed)
Summary: Discharge/Home Health [facility].  Discharge/Home Health [facility].   Imported By: Sherian Rein 05/25/2009 15:12:02  _____________________________________________________________________  External Attachment:    Type:   Image     Comment:   External Document

## 2010-02-15 NOTE — Progress Notes (Signed)
Summary: medication question  Phone Note Call from Patient   Caller: Patient Summary of Call: pt needs to make sure she is taking correct medication: Metoprolol  she was taking "metoprolol succer" but the new one is a titrate.  Which should she be taking? Initial call taken by: Park Breed,  May 04, 2009 10:25 AM

## 2010-02-15 NOTE — Progress Notes (Signed)
  Phone Note Outgoing Call   Call placed by: Dessie Coma  LPN,  November 05, 2009 2:34 PM Call placed to: Patient Summary of Call: LMVM-advised patient per Dr. Kirke Corin, to take Pradaxa for another 3 days to overlap with the Warfarin and then stop Pradaxa.  If any questions, to call office.

## 2010-02-15 NOTE — Progress Notes (Signed)
Summary: lab results  Phone Note Outgoing Call   Call placed by: Dessie Coma  LPN,  November 11, 2009 2:13 PM Call placed to: Patient Summary of Call: [facility] for patient to call back-INR is still low. To Increase Warfarin to 3mg  at bedtime and recheck INR in 1 week per Dr. Kirke Corin.   Follow-up for Phone Call        Phone call completed:patient returned call-notified INR is still low.  To increase Warfarin to 3mg  at bedtime and recheck INR in 1 week. Follow-up by: Dessie Coma  LPN,  November 11, 2009 4:09 PM

## 2010-02-15 NOTE — Miscellaneous (Signed)
Summary: Plan of Care & Treatment/Home Health [facility] of Care & Treatment/Home Health [facility]   Imported By: Sherian Rein 04/27/2009 10:43:52  _____________________________________________________________________  External Attachment:    Type:   Image     Comment:   External Document

## 2010-02-15 NOTE — Progress Notes (Signed)
  Phone Note Outgoing Call   Call placed by: Dessie Coma LPN Call placed to: Patient Summary of Call: T.C. to patient-patient had question about Metoprolol dosage-was taking Succinate and now on Tartrate-Dr. Freida Busman had increased Metoprolol to two times a day (2) 25mg .  Has ov tomorrow at 11am.

## 2010-02-15 NOTE — Letter (Signed)
Summary: [facility]   Imported By: Lester Ches[place]ake 06/19/2009 08:51:47  _____________________________________________________________________  External Attachment:    Type:   Image     Comment:   External Document

## 2010-02-15 NOTE — Letter (Signed)
Summary: [facility]   Imported By: Lester [place] 06/29/2009 12:17:28  _____________________________________________________________________  External Attachment:    Type:   Image     Comment:   External Document

## 2010-02-15 NOTE — Progress Notes (Signed)
  Phone Note Outgoing Call   Call placed by: Dessie Coma,  June 29, 2009 2:36 PM Summary of Call: Patient notified per Dr. Freida Busman, need to increase her Norvasc to 10mg  by mouth once daily.

## 2010-02-15 NOTE — Assessment & Plan Note (Signed)
Summary: Nausea, bloating/dfs   History of Present Illness Visit Type: follow up  Primary GI MD: Sheryn Bison MD FACP FAGA Primary Provider: Illene Regulus, MD  Requesting Provider: n/a Chief Complaint: Lower abd pain after eating, nausea, and bloating  History of Present Illness:   Patient is an 77year old female with multiple medical problems including malignant hypertension, bilateral renal artery stenosis, status post bilateral renal artery stenting, and a history of CVA. Marland KitchenShe is followed by  Dr. Jarold Motto for chronic upper abdominal pain and bloating. 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 Here for two week history of post-prandial mid abdominal pain which feels like trapped gas. Only way to relieve pain is by expelling gas or having BM. Taking Maalox. Weight down 6 pounds since 12/08/08. She is afraid to eat becauses of this post-prandial pain and bloating.  Lichen planus last year, ? drug reaction. Lost 30 pounds. Gained weight back but scared she is losing weight again.    GI Review of Systems    Reports bloating and  weight loss.     Location of  Abdominal pain: mid abdomen. Weight loss of 6  pounds over 6 weeks.   Denies abdominal pain, acid reflux, belching, chest pain, dysphagia with liquids, dysphagia with solids, heartburn, loss of appetite, nausea, vomiting, vomiting blood, and  weight gain.        Denies anal fissure, black tarry stools, change in bowel habit, constipation, diarrhea, diverticulosis, fecal incontinence, heme positive stool, hemorrhoids, irritable bowel syndrome, jaundice, light color stool, liver problems, rectal bleeding, and  rectal pain.    Current Medications (verified): 1)  Levothyroxine Sodium 75 Mcg  Tabs (Levothyroxine Sodium) .... Take One Tab By By Mouth Daily 2)  Prilosec 20 Mg  Cpdr (Omeprazole) .... Take One Tab By Mouth Daily 3)  Beano  Tabs  (Alpha-D-Galactosidase) .... As Needed 4)  Multivitamins   Tabs (Multiple Vitamin) .... Take 1 Tablet By Mouth Once A Day 5)  Eq Aspirin 325 Mg Tabs (Aspirin) .Marland Kitchen.. 1 By Mouth Once Daily For Stroke Prevention. 6)  Clonidine Hcl 0.1 Mg Tabs (Clonidine Hcl) .Marland Kitchen.. 1 Tid 7)  Diltiazem Hcl Er Beads 360 Mg Xr24h-Cap (Diltiazem Hcl Er Beads) .Marland Kitchen.. 1 Once Daily 8)  Furosemide 80 Mg Tabs (Furosemide) .Marland Kitchen.. 1 Tab Once A Day 9)  Losartan Potassium-Hctz 100-25 Mg Tabs (Losartan Potassium-Hctz) .... Take One Tablet By Mouth Every Day 10)  Tekturna 300 Mg Tabs (Aliskiren Fumarate) .... Take One Tablet By Mouth Every Night 11)  Hydralazine Hcl 10 Mg Tabs (Hydralazine Hcl) .... Take One Tablet By Mouth Three Times A Day 12)  Boost  Liqd (Nutritional Supplements) .... One Daily 13)  Hydrocodone-Acetaminophen 5-500 Mg Tabs (Hydrocodone-Acetaminophen) .... 1/2 To 1 Tablet By Mouth As Needed For Pain 14)  Gas-X 80 Mg Chew (Simethicone) .... As Needed 15)  Hydroxychloroquine Sulfate 200 Mg Tabs (Hydroxychloroquine Sulfate) .... One Tablet By Mouth Once Daily  Allergies (verified): 1)  ! Sulfa 2)  ! Augmentin 3)  Sulfamethoxazole (Sulfamethoxazole) 4)  Sulfamethoxazole (Sulfamethoxazole) 5)  Sulfamethoxazole (Sulfamethoxazole)  Past History:  Past Medical History: CORONARY ARTERY DISEASE (ICD-414.00) HYPERTENSION (ICD-401.9) CEREBROVASCULAR ACCIDENT, HX OF (ICD-V12.50) HYPERTRIGLYCERIDEMIA, SEVERE (ICD-272.4) RENAL ARTERY STENOSIS (ICD-440.1) GASTROESOPHAGEAL REFLUX DISEASE (ICD-530.81) DIVERTICULOSIS, COLON (ICD-562.10) GASTRITIS (ICD-535.50) ANXIETY (ICD-300.00) HYPOTHYROIDISM (ICD-244.9) OSTEOARTHRITIS (ICD-715.90) PRURITUS (ICD-698.9) HEMORRHOIDS, INTERNAL (ICD-455.0) LICHEN PLANUS UCD except mumps scarlet fever arthritis knees and back Physician Roster:  Card/PV - Dr. Excell Seltzer          Renal - Dr. Eliott Nine  Past Surgical History: Reviewed history from 04/23/2008 and no changes  required. Cholecystectomy-laproscopic Hysterectomy Oophorectomy Tonsillectomy RAS-stents....bilateral June 2005  Family History: Family History of Coronary Artery Disease:  Mother died age 68 Father: suicide.Marland Kitchen Hx of HTN Family History of Hypertension:  2 Brothers Family History of Diabetes: 1 brother Siblings: sister suicide due to postpartum No FH of Colon Cancer:  Social History: Reviewed history from 02/14/2007 and no changes required. lives alone. I-ADL's.  married 60 yrs, widowed May 28,'07 2 miscarriages, 2 sons: '50, '53 1 daughter: '48 7 grandchildren  Review of Systems  The patient denies allergy/sinus, anemia, anxiety-new, arthritis/joint pain, back pain, blood in urine, breast changes/lumps, change in vision, confusion, cough, coughing up blood, depression-new, fainting, fatigue, fever, headaches-new, hearing problems, heart murmur, heart rhythm changes, itching, menstrual pain, muscle pains/cramps, night sweats, nosebleeds, pregnancy symptoms, shortness of breath, skin rash, sleeping problems, sore throat, swelling of feet/legs, swollen lymph glands, thirst - excessive , urination - excessive , urination changes/pain, urine leakage, vision changes, and voice change.    Vital Signs:  Patient profile:   77 year old female Height:      63 inches Weight:      125 pounds BSA:     1.59 Pulse rate:   70 / minute Pulse rhythm:   regular BP sitting:   132 / 60  (left arm) Cuff size:   regular  Vitals Entered By: Ok Anis CMA (January 21, 2009 10:13 AM)  Physical Exam  General:  Well developed, well nourished, no acute distress. Head:  Normocephalic and atraumatic. Eyes:  .Conjunctiva pink, no icterus.  Mouth:  No oral lesions. Tongue moist.  Neck:  no obvious masses   Lungs:  Clear throughout to auscultation. Heart:  Ocassional skipped beat.  Abdomen:  Soft, mildly distended, tympanitic with active bowel soiunds. No tenderness. No obvious  masses. Neurologic:  Alert and  oriented x4;  grossly normal neurologically. Skin:  Large bruise right thigh. Cervical Nodes:  No significant cervical adenopathy. Psych:  Alert and cooperative. Normal mood and affect.   Impression & Recommendations:  Problem # 1:  ABDOMINAL PAIN-PERIUMBILICAL (ICD-789.05) Assessment New Pain post-prandial and associated with bloating. Bloating has been a chronic problem. Treated successfully two years ago with Xifaxan for small bowel bacterial overgrowth. Will try another course of treatment. Her post-prandial discomfort is associated with mild weight loss and this is concerning. She has PVD but I don't think her symptoms are from ischemia as her pain is clearly relieved with passage of gas / defecation. I have asked patient to monitor her weight. If symptoms don't improve with Xifaxan and / or weight loss continues, further workup will be warranted.   Problem # 2:  CORONARY ARTERY DISEASE (ICD-414.00) Assessment: Comment Only  Problem # 3:  CEREBROVASCULAR ACCIDENT, HX OF (ICD-V12.50) Assessment: Comment Only  Problem # 4:  HYPOTHYROIDISM (ICD-244.9) Assessment: Comment Only  Patient Instructions: 1)  We sent a perscription for Xifaxan 200 MG to your pharmacy. 2)  Take 2 tab 3 times daily x 10 days. 3)  Keep a log of your weight. 4)  If this medication does not help or you keep losing weight you need to make an appointment to see Dr Jarold Motto.  Gunnar Fusi also said she would be glad to see you if y ou cannot get an appointment with Dr. Jarold Motto when you need to. 5)  Copy  sent to : Illene Regulus, MD 6)  The medication list was reviewed and reconciled.  All changed / newly prescribed medications were explained.  A complete medication list was provided to the patient / caregiver. Prescriptions: XIFAXAN 200 MG TABS (RIFAXIMIN) 2 tab three times a day x 10 days  #60 x 0   Entered by:   Lowry Ram NCMA   Authorized by:   Willette Cluster NP   Signed by:    Lowry Ram NCMA on 01/21/2009   Method used:   Electronically to        [facility]  [address] [phone]* (retail)       [address]       [place], [place]  [address]       Ph: [phone] or [phone]       Fax: [phone]   RxID:   [id number]   Appended Document: Nausea, bloating/dfs Pt is doing much better.  She has much less gas and bloating.  The Xifaxan has helped.

## 2010-02-15 NOTE — Progress Notes (Signed)
Summary: INR results  Phone Note Outgoing Call   Call placed by: Dessie Coma  LPN,  December 24, 2009 8:43 AM Call placed to: Patient Summary of Call: Patient notified per Dr. Kirke Corin, INR is fine at 2.0.  To continue taking same dose of Warfarin.  To recheck in [facility] [place].  Appt. scheduled for Jan 5th at 3pm.

## 2010-02-15 NOTE — Progress Notes (Signed)
  Phone Note Outgoing Call   Call placed by: Dessie Coma LPN Call placed to: Patient Summary of Call: Patient notified per Dr. Freida Busman, to start back on Toprol 25mg  once daily  and to hold Hydralazine 10mg  if BP too low.  To see Dr. Freida Busman on Mon. 03/08/09 at 3:30pm.  Patient voiced understanding.

## 2010-02-15 NOTE — Letter (Signed)
Summary: [facility]   Imported By: Sherian Rein 12/17/2009 10:41:23  _____________________________________________________________________  External Attachment:    Type:   Image     Comment:   External Document

## 2010-02-15 NOTE — Assessment & Plan Note (Signed)
Summary: POST HOSP/AT FLUTTER/LB   Vital Signs:  Patient profile:   77 year old female Height:      63 inches (160.02 cm) Weight:      119.13 pounds (54.15 kg) O2 Sat:      98 % on Room air Temp:     96.9 degrees F (36.06 degrees C) oral Pulse rate:   77 / minute BP sitting:   168 / 60  (left arm) Cuff size:   regular  Vitals Entered By: Bill Salinas CMA (April 06, 2009 9:51 AM) Taken by Sydell Axon, SMA  O2 Flow:  Room air CC: [name]. [name], per pt, last flu shot 10/2008, last tetanus unknown, but less than 10 years ago. Pt expressd interest in shingles shot.//[place]   Primary Care Provider:  Illene Regulus, MD   CC:  [name]. [name], per pt, last flu shot 10/2008, last tetanus unknown, and but less than 10 years ago. Pt expressd interest in shingles shot.//[place].  History of Present Illness: Patient for post- hospital follow-up. She had been admitted with a. fib/flutter with RVR with consequent mesenteric ischemia resulting in abdominal pain. she was seen in consultation by vascular surgery who did not feel intervention was required other than controlling her a. fib. 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 She did undergo RF ablation for her a. fib with good results.   Additional problems during her hospitalization: she had EGD revealing esophagitis. She had colonosocpy which revealed a cecal ulcer. Path report reviewed - norma colonic mucosa with ulcerationl. She also developed a hospital acquired pneumonia treated with avelox but she was intolerant of this and stopped after four doses. She has cleared her symptoms. Hospital labs reviewed: a TSH 2/26 was normal.  Patient saw Dr. Freida Busman, her primary cardiologist, March 14th and was thought to be cardiac stable at that time. Labs were order wtih normal metabolic panel with creatinine of 1.25, normal potassium. BNP was  not reported out.   She is feeling good. No chest pain or breathing trouble. She still has mild dyspepsia/ reflux. Blood pressure readings remain elevated per her home records in the 170's to a high of 218; diastolics are normal. She checks her weight which has remained stable at 115 lbs.   Current Medications (verified): 1)  Levothyroxine Sodium 75 Mcg  Tabs (Levothyroxine Sodium) .... Take One Tab By By Mouth Daily 2)  Prilosec 20 Mg  Cpdr (Omeprazole) .... Take One Tab By Mouth Daily 3)  Multivitamins   Tabs (Multiple Vitamin) .... Take 1 Tablet By Mouth Once A Day 4)  Aspir-Low 81 Mg Tbec (Aspirin) .Marland Kitchen.. 1 Once Daily 5)  Furosemide 80 Mg Tabs (Furosemide) .Marland Kitchen.. 1 Tab Once A Day 6)  Boost  Liqd (Nutritional Supplements) .... One Daily 7)  Hydrocodone-Acetaminophen 5-500 Mg Tabs (Hydrocodone-Acetaminophen) .... 1/2 To 1 Tablet By Mouth As Needed For Pain 8)  Hydroxychloroquine Sulfate 200 Mg Tabs (Hydroxychloroquine Sulfate) .... One Tablet By Mouth Once Daily 9)  Pradaxa 75 Mg Caps (Dabigatran Etexilate Mesylate) .Marland Kitchen.. 1 Once Daily 10)  Lisinopril 5 Mg Tabs (Lisinopril) .Marland Kitchen.. 1 Once Daily 11)  Metoprolol Tartrate 25 Mg Tabs (Metoprolol Tartrate) .... 2 Every Morning, 1 At Bedtime 12)  Amiodarone Hcl 200 Mg Tabs (Amiodarone Hcl) .Marland Kitchen.. 1 Once Daily  Allergies (verified): 1)  ! Sulfa 2)  ! Augmentin 3)  ! Avelox  Abc Pack (Moxifloxacin Hcl) 4)  Sulfamethoxazole (Sulfamethoxazole) 5)  Sulfamethoxazole (Sulfamethoxazole) 6)  Sulfamethoxazole (Sulfamethoxazole)  Past History:  Family History: Last updated: 01/21/2009 Family History of Coronary Artery Disease:  Mother died age 45 Father: suicide.Marland Kitchen Hx of HTN Family History of Hypertension:  2 Brothers Family History of Diabetes: 1 brother Siblings: sister suicide due to postpartum No FH of Colon Cancer:  Social History: Last updated: 02/14/2007 lives alone. I-ADL's.  married 60 yrs, widowed May 28,'07 2 miscarriages, 2 sons: '50,  '53 1 daughter: '48 7 grandchildren  Risk Factors: Caffeine Use: 2 (10/23/2006) Exercise: no (10/23/2006)  Risk Factors: Smoking Status: never (10/23/2006)  Past Medical History: ULCERATION OF INTESTINE (ICD-569.82) ESOPHAGITIS, ACUTE (ICD-530.12) CHRONIC VASCULAR INSUFFICIENCY OF INTESTINE (ICD-557.1) ATRIAL FIBRILLATION WITH RAPID VENTRICULAR RESPONSE (ICD-427.31) WEIGHT LOSS-ABNORMAL (ICD-783.21) ABDOMINAL PAIN-PERIUMBILICAL (ICD-789.05) FLATULENCE-GAS-BLOATING (ICD-787.3) DIZZINESS (ICD-780.4) ENCOUNTER FOR LONG-TERM USE OF OTHER MEDICATIONS (ICD-V58.69) EDEMA (ICD-782.3) LICHEN PLANUS (ICD-697.0) CAROTID ARTERY STENOSIS, WITHOUT INFARCTION (ICD-433.10) CORONARY ARTERY DISEASE (ICD-414.00) HYPERTENSION (ICD-401.9) CEREBROVASCULAR ACCIDENT, HX OF (ICD-V12.50) HYPERTRIGLYCERIDEMIA, SEVERE (ICD-272.4) RENAL ARTERY STENOSIS (ICD-440.1) GASTROESOPHAGEAL REFLUX DISEASE (ICD-530.81) DIVERTICULOSIS, COLON (ICD-562.10) GASTRITIS (ICD-535.50) ANXIETY (ICD-300.00) HYPOTHYROIDISM (ICD-244.9) OSTEOARTHRITIS (ICD-715.90) PRURITUS (ICD-698.9) HEMORRHOIDS, INTERNAL (ICD-455.0) UCD except mumps scarlet fever arthritis knees and back Physician Roster:          Card/PV - Dr. Excell Seltzer & Dr. Freida Busman in [place]          Renal - Dr. Eliott Nine  Past Surgical History: Cholecystectomy-laproscopic Hysterectomy Oophorectomy Tonsillectomy RAS-stents....bilateral June 2005 RF ablation for A. Fib March '11 ([place])  Family History: Reviewed history from 01/21/2009 and no changes required. Family History of Coronary Artery Disease:  Mother died age 25 Father: suicide.Marland Kitchen Hx of HTN Family History of Hypertension:  2 Brothers Family History of Diabetes: 1 brother Siblings: sister suicide due to postpartum No FH of Colon Cancer:  Social History: Reviewed history from 02/14/2007 and no changes required. lives alone. I-ADL's.  married 60 yrs, widowed May 28,'07 2 miscarriages, 2 sons: '50,  '53 1 daughter: '48 7 grandchildren  Review of Systems  The patient denies anorexia, fever, weight loss, weight gain, hoarseness, chest pain, syncope, dyspnea on exertion, hemoptysis, abdominal pain, incontinence, suspicious skin lesions, transient blindness, difficulty walking, and abnormal bleeding.    Physical Exam  General:  Elderly white female in no distress Head:  normocephalic and atraumatic.   Eyes:  vision grossly intact, pupils equal, pupils round, corneas and lenses clear, and no injection.   Neck:  supple, full ROM, and no thyromegaly.   Chest Wall:  no deformities.   Lungs:  normal respiratory effort, no intercostal retractions, no accessory muscle use, normal breath sounds, no crackles, and no wheezes.   Heart:  Regular controlled rate with PVC's  Abdomen:  soft, non-tender, and normal bowel sounds.   Msk:  normal ROM, no joint swelling, no joint warmth, and no redness over joints.   Pulses:  2+ radial  Neurologic:  alert & oriented X3, cranial nerves II-XII intact, strength normal in all extremities, and gait normal.   Skin:  turgor normal, color normal, no rashes, and no edema.   Cervical Nodes:  no anterior cervical adenopathy and no posterior cervical adenopathy.   Psych:  Oriented X3, memory intact for recent and remote, normally interactive, good eye contact, and not anxious appearing.     Impression & Recommendations:  Problem # 1:  ULCERATION OF INTESTINE (ICD-569.82) Cecal ulcer at colonoscopy - doing well with no hematochezia or melena.   Plan - continue PPI therapy  Problem # 2:  ESOPHAGITIS, ACUTE (ICD-530.12) Mild discomfort persists but no hemetemesis or dysphagia  Plan - continue PPI therapy  The following medications were removed from the medication list:    Levsin/sl 0.125 Mg Subl (Hyoscyamine sulfate) .Marland Kitchen... 1 sl q 4-6 hrs as needed Her updated medication list for this problem includes:    Prilosec 20 Mg Cpdr (Omeprazole) .Marland Kitchen... Take one tab by  mouth daily  Problem # 3:  CHRONIC VASCULAR INSUFFICIENCY OF INTESTINE (ICD-557.1) No abdominal pain and normal bowel habit. She will follow-up as an outpatient with vascular surgery to discuss whether there is a need for any intervention, e.g. stenting  Problem # 4:  ATRIAL FIBRILLATION WITH RAPID VENTRICULAR RESPONSE (ICD-427.31) Rate seems regular on exam and controlled.   Plan - per Drs. Ladona Ridgel and Freida Busman  The following medications were removed from the medication list:    Diltiazem Hcl Er Beads 360 Mg Xr24h-cap (Diltiazem hcl er beads) .Marland Kitchen... 1 once daily Her updated medication list for this problem includes:    Aspir-low 81 Mg Tbec (Aspirin) .Marland Kitchen... 1 once daily    Metoprolol Tartrate 25 Mg Tabs (Metoprolol tartrate) .Marland Kitchen... 2 every morning, 1 at bedtime    Amiodarone Hcl 200 Mg Tabs (Amiodarone hcl) .Marland Kitchen... 1 once daily  Problem # 5:  LICHEN PLANUS (ICD-697.0) stable and apparently in remission  Problem # 6:  HYPERTENSION (ICD-401.9) Systolic hypertension remains a problem on a multi-drug regimen. At this point care will be under the direction of a single physician - Dr. Freida Busman Fortunately she is asymptomatic.   The following medications were removed from the medication list:    Diltiazem Hcl Er Beads 360 Mg Xr24h-cap (Diltiazem hcl er beads) .Marland Kitchen... 1 once daily    Hydralazine Hcl 10 Mg Tabs (Hydralazine hcl) .Marland Kitchen... Take one tablet by mouth three times a day    Hydralazine Hcl 20 Mg/ml Soln (Hydralazine hcl) .Marland Kitchen... Take one tablet by mouth three times a day Her updated medication list for this problem includes:    Furosemide 80 Mg Tabs (Furosemide) .Marland Kitchen... 1 tab once a day    Lisinopril 5 Mg Tabs (Lisinopril) .Marland Kitchen... 1 once daily    Metoprolol Tartrate 25 Mg Tabs (Metoprolol tartrate) .Marland Kitchen... 2 every morning, 1 at bedtime  Problem # 7:  HYPOTHYROIDISM (ICD-244.9) Thyroid function was checked during her recent admission and is therapeutic/controlled.   Her updated medication list for this  problem includes:    Levothyroxine Sodium 75 Mcg Tabs (Levothyroxine sodium) .Marland Kitchen... Take one tab by by mouth daily  Problem # 8:  Comment  A pleasant and strong minded woman with a complex medical history who seems stable at this time. She is appropriately thoughtful about under-going procedures and interventions. She continues to enjoy the good counsel of her son, a very well credentialled internist/interventional radiologist an alumni of  [employer] [facility]. She will return as needed.  Complete Medication List: 1)  Levothyroxine Sodium 75 Mcg Tabs (Levothyroxine sodium) .... Take one tab by by mouth daily 2)  Prilosec 20 Mg Cpdr (Omeprazole) .... Take one tab by mouth daily 3)  Multivitamins Tabs (Multiple vitamin) .... Take 1 tablet by mouth once a day 4)  Aspir-low 81 Mg Tbec (Aspirin) .Marland Kitchen.. 1 once daily 5)  Furosemide 80 Mg Tabs (Furosemide) .Marland Kitchen.. 1 tab once a day 6)  Boost Liqd (Nutritional supplements) .... One daily 7)  Hydrocodone-acetaminophen 5-500 Mg Tabs (Hydrocodone-acetaminophen) .... 1/2 to 1 tablet by mouth as needed for pain 8)  Hydroxychloroquine Sulfate 200  Mg Tabs (Hydroxychloroquine sulfate) .... One tablet by mouth once daily 9)  Pradaxa 75 Mg Caps (Dabigatran etexilate mesylate) .Marland Kitchen.. 1 once daily 10)  Lisinopril 5 Mg Tabs (Lisinopril) .Marland Kitchen.. 1 once daily 11)  Metoprolol Tartrate 25 Mg Tabs (Metoprolol tartrate) .... 2 every morning, 1 at bedtime 12)  Amiodarone Hcl 200 Mg Tabs (Amiodarone hcl) .Marland Kitchen.. 1 once daily  Other Orders: Zoster (Shingles) Vaccine Live [phone]) Admin 1st Vaccine ([id number])  Prevention & Chronic Care Immunizations   Influenza vaccine: GIVEN  (10/16/2008)    Tetanus booster: Not documented    Pneumococcal vaccine: Not documented    H. zoster vaccine: 04/06/2009: Zostavax  Colorectal Screening   Hemoccult: Not documented    Colonoscopy: DONE  (03/15/2009)  Other Screening   Pap smear: Not documented    Mammogram: Not documented    DXA bone density  scan: Not documented   Smoking status: never  (10/23/2006)  Lipids   Total Cholesterol: 183  (09/18/2007)   LDL: DEL  (09/18/2007)   LDL Direct: 76.7  (09/18/2007)   HDL: 9.7  (09/18/2007)   Triglycerides: 430  (09/18/2007)    SGOT (AST): 21  (05/05/2008)   SGPT (ALT): 14  (05/05/2008)   Alkaline phosphatase: 80  (05/05/2008)   Total bilirubin: 0.7  (05/05/2008)  Hypertension   Last Blood Pressure: 168 / 60  (04/06/2009)   Serum creatinine: 1.4  (07/07/2008)   Serum potassium 4.5  (07/07/2008)  Self-Management Support :    Hypertension self-management support: Not documented    Lipid self-management support: Not documented     Immunization History:  Influenza Immunization History:    Influenza:  given (10/16/2008)  Immunizations Administered:  Zostavax # 1:    Vaccine Type: Zostavax    Site: left arm    Mfr: Merck    Dose: 0.5 ml    Route: [place]    Given by: Ami Bullins CMA/ Administered by Sydell Axon SMA    Exp. Date: 02/12/2010    Lot #: [id number]    VIS given: 10/28/04 given April 06, 2009.

## 2010-02-15 NOTE — Progress Notes (Signed)
  Phone Note Outgoing Call   Call placed by: Dessie Coma LPN Call placed to: Patient Summary of Call: Patient notified via answering machine that blood work including potassium level is normal per Dr. Freida Busman.  To call office if any questions.

## 2010-02-15 NOTE — Miscellaneous (Signed)
Summary: OT/Home Health [facility]  OT/Home Health [facility]   Imported By: Sherian Rein 04/27/2009 10:48:25  _____________________________________________________________________  External Attachment:    Type:   Image     Comment:   External Document

## 2010-02-15 NOTE — Procedures (Signed)
Summary: summary report  summary report   Imported By: Mirna Mires 03/05/2009 09:04:39  _____________________________________________________________________  External Attachment:    Type:   Image     Comment:   External Document

## 2010-02-15 NOTE — Letter (Signed)
Summary: New Patient letter  [facility]  [address] [place], [place] [address]   Phone: [phone]  Fax: [phone]       12/01/2009 MRN: [id number]  Elizabeth Wall [address] [place], [place]  [address]  Dear Ms. Elsie Lincoln,  Welcome to the Gastroenterology Division at [facility]    You are scheduled to see Dr.  Jarold Motto on January 18, 2010 at 10:00 A.M. on the 3rd floor at [facility], [address]  We ask that you try to arrive at our office 15 minutes prior to your appointment time to allow for check-in.  We would like you to complete the enclosed self-administered evaluation form prior to your visit and bring it with you on the day of your appointment.  We will review it with you.  Also, please bring a complete list of all your medications or, if you prefer, bring the medication bottles and we will list them.  Please bring your insurance card so that we may make a copy of it.  If your insurance requires a referral to see a specialist, please bring your referral form from your primary care physician.  Co-payments are due at the time of your visit and may be paid by cash, check or credit card.     Your office visit will consist of a consult with your physician (includes a physical exam), any laboratory testing he/she may order, scheduling of any necessary diagnostic testing (e.g. x-ray, ultrasound, CT-scan), and scheduling of a procedure (e.g. Endoscopy, Colonoscopy) if required.  Please allow enough time on your schedule to allow for any/all of these possibilities.    If you cannot keep your appointment, please call [phone] to cancel or reschedule prior to your appointment date.  This allows [place] the opportunity to schedule an appointment for another patient in need of care.  If you do not cancel or reschedule by 5 p.m. the business day prior to your appointment date, you will be charged a $50.00 late cancellation/no-show fee.    Thank you for  choosing [facility] for your medical needs.  We appreciate the opportunity to care for you.  Please visit [place] at our website  to learn more about our practice.                     Sincerely,                                                             The Gastroenterology Division

## 2010-02-15 NOTE — Progress Notes (Signed)
  Phone Note Call from Patient   Caller: Home Health Nurse/Karen Summary of Call: T.C. from [facility] nurse Clydie Braun from pt's home...patient c/o dizziness when moving around and weakness since yesterday evening but worse today...HR 140 irregular;BP 120/80 and standing is 130/84.  Dr. Freida Busman paged...advised for patient to go to ED at [facility] now for evaluation.  Initial call taken by: Dessie Coma LPN

## 2010-02-15 NOTE — Letter (Addendum)
Summary: [facility]  [facility]   Imported By: Sherian Rein 10/11/2009 10:06:01  _____________________________________________________________________  External Attachment:    Type:   Image     Comment:   External Document

## 2010-02-15 NOTE — Miscellaneous (Signed)
Summary: Initial Summary/Home Health [facility]  Initial Summary/Home Health [facility]   Imported By: Sherian Rein 04/27/2009 10:51:25  _____________________________________________________________________  External Attachment:    Type:   Image     Comment:   External Document

## 2010-02-15 NOTE — Progress Notes (Signed)
Summary: triage  Phone Note Call from Patient Call back at Home Phone [phone]   Caller: Patient Call For: Dr Jarold Motto Reason for Call: Talk to Nurse Summary of Call: Patient wants to speak to nurse wants to be seen sooner than first available 10-21 for severe stomach pain. Please call asap pt has appt to go to. Initial call taken by: Tawni Levy,  October 01, 2009 2:11 PM  Follow-up for Phone Call          Given appt. with N.P. for Tuesday. Follow-up by: Teryl Lucy RN,  October 01, 2009 2:49 PM

## 2010-02-15 NOTE — Progress Notes (Signed)
Summary: Triage  Phone Note Call from Patient Call back at Home Phone [phone]   Caller: Patient Call For: Dr. Jarold Motto Reason for Call: Talk to Nurse Summary of Call: Pt is taking Xifaxan and feels like it is not working. Pt. said she is having alot of stomach pain Initial call taken by: Karna Christmas,  February 05, 2009 3:10 PM  Follow-up for Phone Call        Pt was seen 01/21/09 with similiar symptoms, Had OV with Rozetta Nunnery, NP,    given rx for xifaxin,  Felt beter while taking this, but symptoms have returned.  Today having alot of abd pain, alot  of gas and diarrhea.  Requesting Rx for abd pain.  Will sch OV next week.   Follow-up by: Ashok Cordia RN,  February 05, 2009 3:50 PM  Additional Follow-up for Phone Call Additional follow up Details #1::        pa followup Additional Follow-up by: Mardella Layman MD FACG,  February 05, 2009 3:53 PM    Additional Follow-up for Phone Call Additional follow up Details #2::    Appt will be scheduled for first of next week. Pt states she is miserable. Son has gone to get pt some Maalox to see if this helps.   Can pt have Rx for something to relax her stomach? Ashok Cordia RN  February 05, 2009 4:12 PM Follow-up by: Ashok Cordia RN,  February 05, 2009 4:13 PM  Additional Follow-up for Phone Call Additional follow up Details #3:: Details for Additional Follow-up Action Taken: Per Dr. Jarold Motto may have ultram 50 mg #12 if needed.  To ER if does not improve.  Talked with pt.  States pain has gotten worse and is constant.  Pt has some hydrocodone and she aske if it would be OK to take one.  Instructed pt OK to take but if does not improve she should go to ER or walk in facility.  Appt sch for pt to see Dr. Jarold Motto on Tues 02/09/09.   Appended Document: Triage Called to check on pt, follow up of triage from Friday.  Left message at pt's home and with son Jesusita Oka  [phone])

## 2010-02-15 NOTE — Procedures (Signed)
Summary: Upper Endoscopy  Patient: Elizabeth Wall Note: All result statuses are Final unless otherwise noted.  Tests: (1) Upper Endoscopy (EGD)   EGD Upper Endoscopy       DONE     [facility]     [address]     [place], [place]  [address]           ENDOSCOPY PROCEDURE REPORT           PATIENT:  [name], [name]  MR#:  [id number]     BIRTHDATE:  06/18/1919, 89 yrs. old  GENDER:  female           ENDOSCOPIST:  Judie Petit T. Russella Dar, MD, [facility]           PROCEDURE DATE:  03/15/2009     PROCEDURE:  EGD with biopsy     ASA CLASS:  Class II     INDICATIONS:  abdominal pain, multiple sites, FOBT + stool, weight     loss           MEDICATIONS:  There was residual sedation effect present from     prior procedure, Versed 1 mg IV     TOPICAL ANESTHETIC:  Cetacaine Spray           DESCRIPTION OF PROCEDURE:   After the risks benefits and     alternatives of the procedure were thoroughly explained, informed     consent was obtained.  The Pentax [id number] endoscope was     introduced through the mouth and advanced to the second portion of     the duodenum, without limitations.  The instrument was slowly     withdrawn as the mucosa was fully examined.     <<PROCEDUREIMAGES>>           Esophagitis was found in the distal esophagus. It was erosive and     friable. Multiple biopsies were obtained and sent to pathology.     The stomach was entered and closely examined. The pylorus, antrum,     angularis, and lesser curvature were well visualized, including a     retroflexed view of the cardia and fundus. The stomach wall was     normally distensable. The scope passed easily through the pylorus     into the duodenum. The duodenal bulb was normal in appearance, as     was the postbulbar duodenum. Otherwise the examination was normal.     Retroflexed views revealed no abnormalities. The scope was then     withdrawn from the patient and the procedure completed.        COMPLICATIONS:  None           ENDOSCOPIC IMPRESSION:     1) Distal esophagitis           RECOMMENDATIONS:     1) Anti-reflux regimen     2) PPI bid for 4 weeks then qam long term     3) Await pathology           Kimberly Coye T. Russella Dar, MD, [facility]           CC:  Sheryn Bison, MD      Gordy Savers, MD           n.     Rosalie DoctorVenita Lick. Wyonia Fontanella at 03/15/2009 03:37 PM           Hetty Ely, [id number]  Note: An exclamation mark (!) indicates a result that was not dispersed into  the flowsheet. Document Creation Date: 03/15/2009 3:38 PM _______________________________________________________________________  (1) Order result status: Final Collection or observation date-time: 03/15/2009 15:32 Requested date-time:  Receipt date-time:  Reported date-time:  Referring Physician:   Ordering Physician: Claudette Head [phone]) Specimen Source:  Source: Launa Grill Order Number: [id number] Lab site:

## 2010-02-15 NOTE — Progress Notes (Signed)
----   Converted from flag ---- ---- 05/05/2009 10:45 AM, Frazier Butt Chriscoe wrote: Pt. needs direction on taking Metoprolol. ------------------------------

## 2010-02-15 NOTE — Assessment & Plan Note (Signed)
Summary: dR.PATTERSON PT.- PERSISTENT ABD. PAIN   History of Present Illness Visit Type: Follow-up Visit Primary GI MD: Sheryn Bison MD FACP FAGA Primary Provider: Illene Regulus, MD  Requesting Provider: n/a Chief Complaint: abdominal pain after meals, gas and bloating History of Present Illness:   Patient is an 77 year old female with multiple medical problems followed by Dr. Jarold Motto for chronic abdominal pain (years) and was last seen in January 2011. She has had at least two EGDs since 2005 which were unremarkable except for distal esophagitis. CTscan (no IV contrast) Jan. 2011 was unrevealing. Last EGD and colonoscopy Feb. 2011 during hospitalization for abdominal pain.  During that admission patient was found to have mesenteric ischemia. Cardiology increased Prilosec to twice daily on 09/09/09, she hasn't noticed any difference. Has also been taking Probiotic since March. Usually feels better after expelling gas and Beano before meals almost always prevents her postprandial pain. Her weight is "good".    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  No embolic occlusions identified in the mesenteric vasculature. Evidence of right heart failure and right periportal edema.Free fluid in the abdomen and pelvis.  Some inflammatory change encroaches close to the pancreas.   Lipase was 15.   GI Review of Systems    Reports abdominal pain, belching, bloating, and  loss of appetite.     Location of  Abdominal pain: upper abdomen.    Denies acid reflux, chest pain, dysphagia with liquids, dysphagia with solids, heartburn, nausea, vomiting, vomiting blood, weight loss, and  weight gain.      Reports diverticulosis.     Denies anal fissure, black tarry stools, change in bowel habit, constipation, diarrhea, fecal incontinence, heme positive stool, hemorrhoids, irritable bowel syndrome, jaundice, light color stool,  liver problems, rectal bleeding, and  rectal pain.    Current Medications (verified): 1)  Levothyroxine Sodium 75 Mcg  Tabs (Levothyroxine Sodium) .... Take One Tab By By Mouth Daily 2)  Prilosec 20 Mg  Cpdr (Omeprazole) .... Take One Tab By Mouth Daily 3)  Multivitamins   Tabs (Multiple Vitamin) .... Take 1 Tablet By Mouth Once A Day 4)  Aspir-Low 81 Mg Tbec (Aspirin) .Marland Kitchen.. 1 Once Daily 5)  Furosemide 80 Mg Tabs (Furosemide) .Marland Kitchen.. 1 Tab Once A Day 6)  Boost  Liqd (Nutritional Supplements) .... One Daily 7)  Hydrocodone-Acetaminophen 5-500 Mg Tabs (Hydrocodone-Acetaminophen) .... 1/2 To 1 Tablet By Mouth As Needed For Pain 8)  Hydroxychloroquine Sulfate 200 Mg Tabs (Hydroxychloroquine Sulfate) .... One Tablet By Mouth Once Daily 9)  Pradaxa 75 Mg Caps (Dabigatran Etexilate Mesylate) .Marland Kitchen.. 1 Once Daily 10)  Amiodarone Hcl 200 Mg Tabs (Amiodarone Hcl) .Marland Kitchen.. 1 Once Daily 11)  Lisinopril 20 Mg Tabs (Lisinopril) .... Take One Tablet Daily 12)  Metoprolol Tartrate 50 Mg Tabs (Metoprolol Tartrate) .... Take One Tablet Two Times A Day 13)  Hydralazine Hcl 50 Mg Tabs (Hydralazine Hcl) .... Take One Tablet By Mouth Three Times A Day 14)  Amlodipine Besylate 10 Mg Tabs (Amlodipine Besylate) .... Take One Tablet By Mouth Once Daily  Allergies: 1)  ! Sulfa 2)  ! Augmentin 3)  ! Avelox Abc Pack (Moxifloxacin Hcl) 4)  Sulfamethoxazole (Sulfamethoxazole)  Past History:  Past Medical History: ULCERATION OF INTESTINE (ICD-569.82) ESOPHAGITIS, ACUTE (ICD-530.12) CHRONIC VASCULAR INSUFFICIENCY OF INTESTINE (ICD-557.1) ATRIAL FIBRILLATION WITH RAPID VENTRICULAR RESPONSE (ICD-427.31) WEIGHT LOSS-ABNORMAL (ICD-783.21) FLATULENCE-GAS-BLOATING (ICD-787.3) DIZZINESS (ICD-780.4) ENCOUNTER FOR LONG-TERM USE OF OTHER MEDICATIONS (  ICD-V58.69) EDEMA (ICD-782.3) LICHEN PLANUS (ICD-697.0) CAROTID ARTERY STENOSIS, WITHOUT INFARCTION (ICD-433.10) CORONARY ARTERY DISEASE (ICD-414.00) HYPERTENSION  (ICD-401.9) CEREBROVASCULAR ACCIDENT, HX OF (ICD-V12.50) HYPERTRIGLYCERIDEMIA, SEVERE (ICD-272.4) RENAL ARTERY STENOSIS (ICD-440.1) GASTROESOPHAGEAL REFLUX DISEASE (ICD-530.81) DIVERTICULOSIS, COLON (ICD-562.10) GASTRITIS (ICD-535.50) ANXIETY (ICD-300.00) HYPOTHYROIDISM (ICD-244.9) OSTEOARTHRITIS (ICD-715.90) PRURITUS (ICD-698.9) HEMORRHOIDS, INTERNAL (ICD-455.0) UCD except mumps scarlet fever arthritis knees and back Physician Roster:          Card/PV - Dr. Excell Seltzer & Dr. Freida Busman in [place]          Renal - Dr. Eliott Nine  Past Surgical History: Reviewed history from 04/06/2009 and no changes required. Cholecystectomy-laproscopic Hysterectomy Oophorectomy Tonsillectomy RAS-stents....bilateral June 2005 RF ablation for A. Fib March '11 ([facility])  Family History: Reviewed history from 01/21/2009 and no changes required. Family History of Coronary Artery Disease:  Mother died age 47 Father: suicide.Marland Kitchen Hx of HTN Family History of Hypertension:  2 Brothers Family History of Diabetes: 1 brother Siblings: sister suicide due to postpartum No FH of Colon Cancer:  Social History: Reviewed history from 02/14/2007 and no changes required. lives alone. I-ADL's.  married 60 yrs, widowed May 28,'07 2 miscarriages, 2 sons: '50, '53 1 daughter: '48 7 grandchildren  Review of Systems       The patient complains of arthritis/joint pain.  The patient denies allergy/sinus, anemia, anxiety-new, back pain, blood in urine, breast changes/lumps, change in vision, confusion, cough, coughing up blood, depression-new, fainting, fatigue, fever, headaches-new, hearing problems, heart murmur, heart rhythm changes, itching, menstrual pain, muscle pains/cramps, night sweats, nosebleeds, pregnancy symptoms, shortness of breath, skin rash, sleeping problems, sore throat, swelling of feet/legs, swollen lymph glands, thirst - excessive, urination - excessive, urination changes/pain, urine leakage, vision changes,  and voice change.    Vital Signs:  Patient profile:   77 year old female Height:      63 inches Weight:      117.38 pounds BMI:     20.87 Pulse rate:   60 / minute Pulse rhythm:   regular BP sitting:   156 / 68  (left arm) Cuff size:   regular  Vitals Entered By: June McMurray CMA Duncan Dull) (October 05, 2009 9:41 AM)  Physical Exam  General:  Well developed, well nourished, no acute distress. Head:  Normocephalic and atraumatic. Eyes:  Conjunctiva pink, no icterus.  Mouth:  No oral lesions. Tongue moist.  Neck:  no obvious masses  Lungs:  Clear throughout to auscultation. Heart:  RRR. Murmur present. Abdomen:  Abdomen soft, nontender, nondistended but slightly tympanitic. No obvious masses or hepatomegaly. Normal bowel sounds.  Msk:  Symmetrical with no gross deformities. Normal posture. Extremities:  Trace BLE edema.   Neurologic:  Alert and  oriented x4;  grossly normal neurologically. Skin:  Intact without significant lesions or rashes. Cervical Nodes:  No significant cervical adenopathy. Psych:  Alert and cooperative. Normal mood and affect.   Impression & Recommendations:  Problem # 1:  ABDOMINAL PAIN-EPIGASTRIC (ICD-789.06) Assessment Deteriorated Chronic epigastric pain / bloating with unrevealing GI workup over last several years. During hospital admission late Feb. 2011 for abdominal pain patient had EGD and colonoscopy with findings of only distal esophagitits and a 1.5cm clean based cecal ulcer. CTangiogram revealed stenosis of mesenteric vessels. Patient is followed by Dr. Myra Gianotti (vascular). I reviewed last office note on 04/12/09 at which time patient was feeling okay. Possibility of stent discussed depending on whether patient had recurrent symptoms.  Given negative upper endoscopies in setting of same epigastric pain I think repeat EGD would be of  low yield. Twice daily PPI hasn't helped so recommend patient resume once daily dosing. Her pain is worrisome for  intestinal angina but there may be a functional component as well since expelling gas significantly improves pain. Recommend patient follow up with cardiology and / or vascular surgery ASAP.          Problem # 2:  CHRONIC VASCULAR INSUFFICIENCY OF INTESTINE (ICD-557.1) Assessment: Comment Only See #1  Problem # 3:  FLATULENCE-GAS-BLOATING (ICD-787.3) Assessment: Unchanged Chronic, relieved with Beano.  Patient Instructions: 1)  Continue to take Prilosec 20 MG 30 min prior to breakfast once daily.  2)  We did get the notes from Dr. Jenne Pane From [facility]. 3)  Willette Cluster RNP will review these notes.  4)  Copy sent to : Illene Regulus. MD 5)  The medication list was reviewed and reconciled.  All changed / newly prescribed medications were explained.  A complete medication list was provided to the patient / caregiver.

## 2010-02-15 NOTE — Progress Notes (Signed)
Summary: triage  Phone Note Call from Patient   Caller: Patient Call For: Elizabeth Wall Reason for Call: Talk to Nurse Summary of Call: Patient has stoamch pain after each meal , wants to discuss with nurse Initial call taken by: Tawni Levy,  February 15, 2009 1:36 PM  Follow-up for Phone Call        Pt recently in the hospital with diverticulitis.  Finished antiobiotic Saturday.  On Sat pm started having lower abd pain again.  Since then has lower abd pain after eating.  Pain is only relieved by passing gas or having BM.  Pt is on probiotic as suggested by Dr. Jarold Wall.  See OV note of 02/09/09.  Pt concerned infection is not completly cleared. Follow-up by: Ashok Cordia RN,  February 15, 2009 2:03 PM  Additional Follow-up for Phone Call Additional follow up Details #1::        as needed LEVSIN SL.Marland Kitchen Additional Follow-up by: Mardella Layman MD FACG,  February 15, 2009 2:08 PM    Additional Follow-up for Phone Call Additional follow up Details #2::    Left message for pt to call. Lupita Leash Surface RN  February 15, 2009 2:23 PM  Pt notified.  To report back if no better. Follow-up by: Ashok Cordia RN,  February 15, 2009 2:45 PM  New/Updated Medications: LEVSIN/SL 0.125 MG SUBL (HYOSCYAMINE SULFATE) 1 sl q 4-6 hrs as needed Prescriptions: LEVSIN/SL 0.125 MG SUBL (HYOSCYAMINE SULFATE) 1 sl q 4-6 hrs as needed  #40 x 1   Entered by:   Ashok Cordia RN   Authorized by:   Mardella Layman MD [facility]   Signed by:   Ashok Cordia RN on 02/15/2009   Method used:   Electronically to        [facility]  [address] [phone]* (retail)       [address]       [place], [place]  [address]       Ph: [phone] or [phone]       Fax: [phone]   RxID:   [id number]

## 2010-02-15 NOTE — Progress Notes (Signed)
Summary: Progress report-Xifaxan working well.  ---- Converted from flag ---- ---- 01/27/2009 10:21 AM, Willette Cluster NP wrote: Peri Jefferson news, Please convert this to a phone note. Can you show me how to do this next time I am in office? Thanks  ---- 01/25/2009 11:10 AM, Lowry Ram NCMA wrote: Pt Treena Cosman is doing much better.  The Xifaxan seems to be helping her.  That is what Dr. Jarold Motto gave her in the past. ------------------------------

## 2010-02-15 NOTE — Procedures (Signed)
Summary: Colonoscopy  Patient: Elizabeth Wall Note: All result statuses are Final unless otherwise noted.  Tests: (1) Colonoscopy (COL)   COL Colonoscopy           DONE      [facility]     [address]     [place], [place]  [address]           COLONOSCOPY PROCEDURE REPORT           PATIENT:  Elizabeth, Wall  MR#:  [id number]     BIRTHDATE:  Oct 09, 1919, 89 yrs. old  GENDER:  female           ENDOSCOPIST:  Judie Petit T. Russella Dar, MD, [facility]           PROCEDURE DATE:  03/15/2009     PROCEDURE:  Colonoscopy with biopsy     ASA CLASS:  Class II     INDICATIONS:  1) FOBT positive stool  2) abdominal pain-multiple     sites 3) weight loss           MEDICATIONS:   Fentanyl 50 mcg IV, Versed 3 mg IV           DESCRIPTION OF PROCEDURE:   After the risks benefits and     alternatives of the procedure were thoroughly explained, informed     consent was obtained.  Digital rectal exam was performed and     revealed no abnormalities.   The [id number] ([id number]) endoscope was     introduced through the anus and advanced to the cecum, which was     identified by both the appendix and ileocecal valve, without     limitations.  The quality of the prep was excellent, using Colyte.     The instrument was then slowly withdrawn as the colon was fully     examined.     <<PROCEDUREIMAGES>>           FINDINGS:  An ulcer was found in the cecum. It was superficial,     clean based and benign appearing. It was 1.5 cm in size. Multiple     biopsies were obtained and sent to pathology. The terminal ileum     appeared normal.  Moderate diverticulosis was found sigmoid to     descending colon. This was otherwise a normal examination of the     colon. Retroflexed views in the rectum revealed internal     hemorrhoids, small. The time to cecum =  5  minutes. The scope was     then withdrawn (time =  11  min) from the patient and the     procedure completed.           COMPLICATIONS:  None       ENDOSCOPIC IMPRESSION:     1) 1.5 cm ulcer in the cecum     2) Moderate diverticulosis in the sigmoid to descending     3) Internal hemorrhoids           RECOMMENDATIONS:     1) Await pathology results     2) Upper endoscopy today           Malcolm T. Russella Dar, MD, [facility]           CC: Sheryn Bison, MD     Gordy Savers, MD           n.     Rosalie DoctorVenita Lick. Stark at 03/15/2009 03:23 PM  Reve, [place], [id number]  Note: An exclamation mark (!) indicates a result that was not dispersed into the flowsheet. Document Creation Date: 03/15/2009 3:24 PM _______________________________________________________________________  (1) Order result status: Final Collection or observation date-time: 03/15/2009 15:16 Requested date-time:  Receipt date-time:  Reported date-time:  Referring Physician:   Ordering Physician: Claudette Head [phone]) Specimen Source:  Source: Launa Grill Order Number: [id number] Lab site:

## 2010-02-15 NOTE — Progress Notes (Signed)
  Phone Note Call from Patient   Caller: Patient Call For: nurse Summary of Call: T.C. from patient.Marland KitchenMarland KitchenHaving difficulty talking and writing...feeling very shaky.Marland KitchenMarland KitchenThinks it is from Avelox she was given while in [place]. for pneumonia.  Has 4 more doses.Marland KitchenMarland KitchenPer Dr. Freida Busman, need to contact her PCP but if she can't he thinks she has been on it long enough and can stop the medicine.  Patient [place]CP is out of [place] so will try to not take the med. in the morning and see if she has the same type episode.  Feeling much better at present time and OT there to examine her. Initial call taken by: Dessie Coma LPN

## 2010-02-15 NOTE — Progress Notes (Signed)
Summary: Records received  Records received from [facility]. Records forwarded to Dr. Jarold Motto for review. Lupita Leash aware. Dena Chavis  February 09, 2009 10:58 AM

## 2010-02-15 NOTE — Progress Notes (Signed)
Summary: Nausea-appt asap  Phone Note Call from Patient Call back at Home Phone [phone] Call back at or cell [phone]   Call For: Dr Jarold Motto Summary of Call: Is having nausea and severe abd pain. Would like to be seen asap. Initial call taken by: Leanor Kail [facility],  January 20, 2009 8:46 AM  Follow-up for Phone Call        Left message for patient to call back Darcey Nora RN, [facility]  January 20, 2009 10:30 AM  Additional Follow-up for Phone Call Additional follow up Details #1::        talked with pt.  She has been having problems for one week.  Unable to eat very much for the past few days. Feels bloated.  C/O nausea.  Requesting OV.  Offer OV with NP or PA per Dr. Jarold Motto. Additional Follow-up by: Ashok Cordia RN,  January 20, 2009 2:02 PM    Additional Follow-up for Phone Call Additional follow up Details #2::    Left message for pt to call.  Appt available with Gunnar Fusi tomorrow at 10:00 if pt wants to be seen tomorrow or she can wait until Tuesday 01/26/09 for OV with Dr. Jarold Motto.   Lupita Leash Surface RN  January 20, 2009 2:12 PM  talked with pt.  She will see Gunnar Fusi tomorrow at 10:00.   Follow-up by: Ashok Cordia RN,  January 20, 2009 4:54 PM

## 2010-02-15 NOTE — Miscellaneous (Signed)
Summary: Discharge from [facility].  Discharge from [facility].   Imported By: Sherian Rein 04/06/2009 14:35:43  _____________________________________________________________________  External Attachment:    Type:   Image     Comment:   External Document

## 2010-02-15 NOTE — Progress Notes (Signed)
  Phone Note Outgoing Call   Call placed by: Dessie Coma LPN Call placed to: Myrlene Broker RN/Home Health Nurse Summary of Call: T.C. to [facility] Nurse-to draw labs prior to next OV on Mon. 03/29/09-Advised to draw per Dr. Cordelia Poche  BMP,CBC, and BNP.  Karen's cell [phone]. Initial call taken by: Dessie Coma LPN

## 2010-02-15 NOTE — Miscellaneous (Signed)
Summary: PT/Home Health [facility]  PT/Home Health [facility]   Imported By: Sherian Rein 04/27/2009 10:49:36  _____________________________________________________________________  External Attachment:    Type:   Image     Comment:   External Document

## 2010-02-17 ENCOUNTER — Telehealth: Payer: Self-pay | Admitting: Cardiovascular Disease

## 2010-02-17 NOTE — Letter (Signed)
Summary: [facility]   Imported By: Marylou Mccoy 12/20/2009 15:23:18  _____________________________________________________________________  External Attachment:    Type:   Image     Comment:   External Document

## 2010-02-17 NOTE — Assessment & Plan Note (Signed)
Summary: ABD PAIN NOT CONSISTENT WITH VASCULAR DISEASE...LSW.   History of Present Illness Visit Type: Follow-up Consult Primary GI MD: Sheryn Bison MD FACP FAGA Primary Provider: Illene Regulus, MD  Requesting Provider: Marigene Ehlers, MD  Chief Complaint: Lower abd pain, belching, bloating, GERD, BRB when patient wipes after BMs, hemorrhoids, and dysphagia  History of Present Illness:   77 year old Caucasian female with documented intestinal ischemia her previous colonoscopy showing cecal ulceration, Doppler and arterial exam showing celiac artery obstruction, SMA arterial disease, and collateral increased blood flow in her IMA. She is followed by Dr. Myra Gianotti in vascular surgery and Dr. Illene Regulus and cardiology. Because of her problems she is recently been placed on Coumadin therapy, aspirin, and is chronically on Prilosec and a variety of cardiac and antihypertensive medications.  She's recently been treated for excessive gas and bloating with a variety of over-the-counter products. She does take enteral nutritional supplements. Her main complaint continues to be postprandial gas, bloating, abdominal discomfort. In spite of these complaints she is slowly gaining weight. She has no history of chronic pancreatitis, inflammatory bowel disease, or peptic ulcer disease. She has had both endoscopy and colonoscopy within the last year.  She has multiple drug allergies and multiple medical problems. Previous surgeries have included cholecystectomy, total abdominal hysterectomy and removal of her ovaries, bladder repair, and RAS stenting. She denies abuse of alcohol, cigarettes, or NSAIDs. She uses hydrocodone-acetaminophen p.r.n. for vomiting all and generalized pain.   GI Review of Systems    Reports abdominal pain, acid reflux, belching, bloating, dysphagia with solids, and  heartburn.     Location of  Abdominal pain: lower abdomen.    Denies chest pain, dysphagia with liquids, loss of  appetite, nausea, vomiting, vomiting blood, weight loss, and  weight gain.      Reports constipation, diarrhea, diverticulosis, hemorrhoids, and  rectal bleeding.     Denies anal fissure, black tarry stools, change in bowel habit, fecal incontinence, heme positive stool, irritable bowel syndrome, jaundice, light color stool, liver problems, and  rectal pain.    Current Medications (verified): 1)  Levothyroxine Sodium 75 Mcg  Tabs (Levothyroxine Sodium) .... Take One Tab By By Mouth Daily 2)  Prilosec 20 Mg  Cpdr (Omeprazole) .... Take One Tab By Mouth Daily 3)  Multivitamins   Tabs (Multiple Vitamin) .... Take 1 Tablet By Mouth Once A Day 4)  Aspir-Low 81 Mg Tbec (Aspirin) .Marland Kitchen.. 1 Once Daily 5)  Furosemide 80 Mg Tabs (Furosemide) .Marland Kitchen.. 1 Tab Once A Day 6)  Boost  Liqd (Nutritional Supplements) .... Every Other Day 7)  Hydrocodone-Acetaminophen 5-500 Mg Tabs (Hydrocodone-Acetaminophen) .... 1/2 To 1 Tablet By Mouth As Needed For Pain 8)  Hydroxychloroquine Sulfate 200 Mg Tabs (Hydroxychloroquine Sulfate) .... One Tablet By Mouth Once Daily 9)  Warfarin Sodium 2 Mg Tabs (Warfarin Sodium) .... Take One Tablet By Mouth At Bedtime 10)  Amiodarone Hcl 200 Mg Tabs (Amiodarone Hcl) .Marland Kitchen.. 1 Once Daily 11)  Lisinopril 20 Mg Tabs (Lisinopril) .... Take One Tablet Daily 12)  Metoprolol Tartrate 50 Mg Tabs (Metoprolol Tartrate) .... Take One Tablet Two Times A Day 13)  Hydralazine Hcl 50 Mg Tabs (Hydralazine Hcl) .... Take One Tablet By Mouth Three Times A Day 14)  Amlodipine Besylate 10 Mg Tabs (Amlodipine Besylate) .... Take One Tablet By Mouth Once Daily 15)  Beano  Tabs (Alpha-D-Galactosidase) .... As Needed 16)  Hemorrhoid 0.25-3-85.5 % Supp (Pe-Shark Liver Oil-Cocoa Buttr) .... As Needed  Allergies (verified): 1)  !  Sulfa 2)  ! Augmentin 3)  ! Avelox Abc Pack (Moxifloxacin Hcl) 4)  Sulfamethoxazole (Sulfamethoxazole)  Past History:  Past medical, surgical, family and social histories (including  risk factors) reviewed for relevance to current acute and chronic problems.  Past Medical History: ESOPHAGEAL STENOSIS (ICD-530.3) IRRITABLE BOWEL SYNDROME (ICD-564.1) CHRONIC KIDNEY DISEASE UNSPECIFIED (ICD-585.9) ABDOMINAL PAIN-EPIGASTRIC (ICD-789.06) ULCERATION OF INTESTINE (ICD-569.82) ESOPHAGITIS, ACUTE (ICD-530.12) CHRONIC VASCULAR INSUFFICIENCY OF INTESTINE (ICD-557.1) ATRIAL FIBRILLATION WITH RAPID VENTRICULAR RESPONSE (ICD-427.31) WEIGHT LOSS-ABNORMAL (ICD-783.21) ABDOMINAL PAIN-PERIUMBILICAL (ICD-789.05) FLATULENCE-GAS-BLOATING (ICD-787.3) DIZZINESS (ICD-780.4) ENCOUNTER FOR LONG-TERM USE OF OTHER MEDICATIONS (ICD-V58.69) EDEMA (ICD-782.3) LICHEN PLANUS (ICD-697.0) CAROTID ARTERY STENOSIS, WITHOUT INFARCTION (ICD-433.10) CORONARY ARTERY DISEASE (ICD-414.00) HYPERTENSION (ICD-401.9) CEREBROVASCULAR ACCIDENT, HX OF (ICD-V12.50) HYPERTRIGLYCERIDEMIA, SEVERE (ICD-272.4) RENAL ARTERY STENOSIS (ICD-440.1) GASTROESOPHAGEAL REFLUX DISEASE (ICD-530.81) DIVERTICULOSIS, COLON (ICD-562.10) GASTRITIS (ICD-535.50) ANXIETY (ICD-300.00) HYPOTHYROIDISM (ICD-244.9) OSTEOARTHRITIS (ICD-715.90) PRURITUS (ICD-698.9) HEMORRHOIDS, INTERNAL (ICD-455.0) UCD except mumps scarlet fever arthritis knees and back  Physician Roster:          Card/PV - Dr. Excell Seltzer & Dr. Freida Busman in [place]          Renal - Dr. Eliott Nine  Past Surgical History: Cholecystectomy-laproscopic Hysterectomy Oophorectomy Tonsillectomy RAS-stents....bilateral June 2005 RF ablation for A. Fib March '11 ([place]) Bladder Repair  Family History: Reviewed history from 01/21/2009 and no changes required. Family History of Coronary Artery Disease:  Mother died age 8 Father: suicide.Marland Kitchen Hx of HTN Family History of Hypertension:  2 Brothers Family History of Diabetes: 1 brother Siblings: sister suicide due to postpartum No FH of Colon Cancer: Family History of Irritable Bowel Syndrome: Mother   Social History: Reviewed  history from 02/14/2007 and no changes required. lives alone. I-ADL's.  married 60 yrs, widowed May 28,'07 2 miscarriages, 2 sons: '50, '53 1 daughter: '48 7 grandchildren Patient has never smoked.  Alcohol Use - no Daily Caffeine Use: 2-3 daily   Review of Systems       The patient complains of allergy/sinus, arthritis/joint pain, change in vision, cough, fatigue, hearing problems, swelling of feet/legs, and thirst - excessive.  The patient denies anemia, anxiety-new, back pain, blood in urine, breast changes/lumps, confusion, coughing up blood, depression-new, fainting, fever, headaches-new, heart murmur, heart rhythm changes, itching, menstrual pain, muscle pains/cramps, night sweats, nosebleeds, pregnancy symptoms, shortness of breath, skin rash, sleeping problems, sore throat, swollen lymph glands, thirst - excessive , urination - excessive , urination changes/pain, urine leakage, vision changes, and voice change.    Vital Signs:  Patient profile:   77 year old female Height:      63 inches Weight:      123 pounds BMI:     21.87 BSA:     1.57 Pulse rate:   60 / minute Pulse rhythm:   regular BP sitting:   126 / 64  (left arm) Cuff size:   regular  Vitals Entered By: Ok Anis CMA (January 21, 2010 9:47 AM)  Physical Exam  General:  Well developed, well nourished, no acute distress.She appears well and in good health and is cheerful and cooperative. Head:  Normocephalic and atraumatic. Eyes:  PERRLA, no icterus.exam deferred to patient's ophthalmologist.   Lungs:  Clear throughout to auscultation. Heart:  Regular rate and rhythm; no murmurs, rubs,  or bruits.She appeared to be in a regular rhythm at this time with no S3 gallop. Abdomen:  Soft, nontender and nondistended. No masses, hepatosplenomegaly or hernias noted. Normal bowel sounds.I cannot appreciate an abdominal bruit but there are active high-pitched bowel sounds noted and some abdominal distention.  There are no  localized areas of tenderness, masses, organomegaly. Neurologic:  Alert and  oriented x4;  grossly normal neurologically. Psych:  Alert and cooperative. Normal mood and affect.   Impression & Recommendations:  Problem # 1:  CHRONIC VASCULAR INSUFFICIENCY OF INTESTINE (ICD-557.1) Assessment Deteriorated She has intestinal ischemia of a chronic nature which appears to be nonprogressive. She hasn't appointment with Dr. Myra Gianotti next week for repeat vascular examination and consideration of intestinal mesenteric stenting. Some of her symptoms seem consistent with bacterial overgrowth syndrome, and I have placed her on Xifaxan 550 mg twice a day as a nonabsorbable antibiotic, also probiotic therapy with daily Align. I have advised her to continue her Prilosec, enteral supplementation, and other cardiac medications. I see no need for repeat endoscopy or colonoscopy at this time.  Problem # 2:  ATRIAL FIBRILLATION WITH RAPID VENTRICULAR RESPONSE (ICD-427.31) Assessment: Improved continued multiple cardiac medications as per cardiology and Dr. Illene Regulus.  Problem # 3:  FLATULENCE-GAS-BLOATING (ICD-787.3) Assessment: Comment Only  Problem # 4:  DIVERTICULOSIS, COLON (ICD-562.10) Assessment: Unchanged  Patient Instructions: 1)  Copy sent to : Illene Regulus, MD  & Marigene Ehlers, MD and Dr. Mady Gemma in [place] 2)  Your prescription(s) have been sent to you pharmacy.  3)  Take the Align samples given. 4)  Patient can not take Flagyl or another antibiotic in place of Xifaxan due to her Warfin. 5)  The medication list was reviewed and reconciled.  All changed / newly prescribed medications were explained.  A complete medication list was provided to the patient / caregiver. Prescriptions: XIFAXAN 550 MG TABS (RIFAXIMIN) take one by mouth two times a day for 14 days  #28 x 0   Entered by:   Harlow Mares CMA (AAMA)   Authorized by:   Mardella Layman MD [facility]   Signed by:   Harlow Mares CMA (AAMA) on 01/21/2010   Method used:   Electronically to        [facility]  [phone] (retail)       [address]       [place], [place]  [address]       Ph: [phone] or [phone]       Fax: [phone]   RxID:   [id number]

## 2010-02-17 NOTE — Medication Information (Signed)
Summary: new  Anticoagulant Therapy  Managed by: Louann Sjogren, PharmD Referring MD: Kennith Maes MD, Gwenyth Bouillon PCP: Illene Regulus, MD  Supervising MD: Jens Som MD,Brian Indication 1: Cerebrovascular Accident INR POC 2.6 INR RANGE 2-3  Dietary changes: no    Health status changes: no    Bleeding/hemorrhagic complications: no    Recent/future hospitalizations: no    Any changes in medication regimen? no    Recent/future dental: no  Any missed doses?: no       Is patient compliant with meds? yes      Comments: Continues on 14 day course of rifaximin.  Allergies: 1)  ! Sulfa 2)  ! Augmentin 3)  ! Avelox Abc Pack (Moxifloxacin Hcl) 4)  Sulfamethoxazole (Sulfamethoxazole)  Anticoagulation Management History:      The patient is taking warfarin and comes in today for a routine follow up visit.  Positive risk factors for bleeding include an age of 75 years or older.  The bleeding index is 'intermediate risk'.  Positive CHADS2 values include History of HTN and Age > 75 years old.  Her last INR was 1.9 and today's INR is 2.6.  Anticoagulation responsible provider: Jens Som MD,Brian.  INR POC: 2.6.  Cuvette Lot#: [id number].    Anticoagulation Management Assessment/Plan:      [name] current anticoagulation dose is Warfarin sodium 2 mg tabs: Take one tablet by mouth at bedtime.  The next INR is due 02/15/2010.  Results were reviewed/authorized by Louann Sjogren, PharmD.  She was notified by Cloyde Reams RN.         Prior Anticoagulation Instructions: INR 1.9 (goal 2-3)  Rifaximin will decrease INR.  Start taking 1 1/2 tablets daily until we see you next Monday (01/24/2010) at 10:15AM.  Current Anticoagulation Instructions: INR 2.6 (goal 2-3)  Continue taking 3mg   daily.  Stop taking coumadin after your dose on Jan. 18th in preparation for your procedure on Jan. 24th.  After procedure, begin taking warfarin 2mg  daily. Next appointment: Tuesday, Jan. 31st at 10:15AM.

## 2010-02-17 NOTE — Medication Information (Signed)
Summary: new/ sp  Anticoagulant Therapy  Managed by: Louann Sjogren, PharmD Referring MD: Kennith Maes MD, Gwenyth Bouillon PCP: Illene Regulus, MD  Supervising MD: Tenny Craw MD,Alyxander Kollmann Indication 1: Cerebrovascular Accident INR POC 1.9 INR RANGE 2-3  Dietary changes: no    Health status changes: no    Bleeding/hemorrhagic complications: no    Recent/future hospitalizations: no    Any changes in medication regimen? yes       Details: Rifaximin x 14 days starting today  Recent/future dental: no  Any missed doses?: no       Is patient compliant with meds? yes      Comments: Coumadin education completed today.  Patient will review Coumadin booklet and return with questions next week.  Current Medications (verified): 1)  Levothyroxine Sodium 75 Mcg  Tabs (Levothyroxine Sodium) .... Take One Tab By By Mouth Daily 2)  Prilosec 20 Mg  Cpdr (Omeprazole) .... Take One Tab By Mouth Daily 3)  Multivitamins   Tabs (Multiple Vitamin) .... Take 1 Tablet By Mouth Once A Day 4)  Aspir-Low 81 Mg Tbec (Aspirin) .Marland Kitchen.. 1 Once Daily 5)  Furosemide 80 Mg Tabs (Furosemide) .Marland Kitchen.. 1 Tab Once A Day 6)  Boost  Liqd (Nutritional Supplements) .... Every Other Day 7)  Hydrocodone-Acetaminophen 5-500 Mg Tabs (Hydrocodone-Acetaminophen) .... 1/2 To 1 Tablet By Mouth As Needed For Pain 8)  Hydroxychloroquine Sulfate 200 Mg Tabs (Hydroxychloroquine Sulfate) .... One Tablet By Mouth Once Daily 9)  Warfarin Sodium 2 Mg Tabs (Warfarin Sodium) .... Take One Tablet By Mouth At Bedtime 10)  Amiodarone Hcl 200 Mg Tabs (Amiodarone Hcl) .Marland Kitchen.. 1 Once Daily 11)  Lisinopril 20 Mg Tabs (Lisinopril) .... Take One Tablet Daily 12)  Metoprolol Tartrate 50 Mg Tabs (Metoprolol Tartrate) .... Take One Tablet Two Times A Day 13)  Hydralazine Hcl 50 Mg Tabs (Hydralazine Hcl) .... Take One Tablet By Mouth Three Times A Day 14)  Amlodipine Besylate 10 Mg Tabs (Amlodipine Besylate) .... Take One Tablet By Mouth Once Daily 15)  Beano  Tabs  (Alpha-D-Galactosidase) .... As Needed 16)  Hemorrhoid 0.25-3-85.5 % Supp (Pe-Shark Liver Oil-Cocoa Buttr) .... As Needed 17)  Xifaxan 550 Mg Tabs (Rifaximin) .... Take One By Mouth Two Times A Day For 14 Days 18)  Align  Caps (Probiotic Product) .... Take One By Mouth Once Daily While On Xifaxan.  Allergies (verified): 1)  ! Sulfa 2)  ! Augmentin 3)  ! Avelox Abc Pack (Moxifloxacin Hcl) 4)  Sulfamethoxazole (Sulfamethoxazole)  Anticoagulation Management History:      The patient comes in today for her initial visit for anticoagulation therapy.  Positive risk factors for bleeding include an age of 75 years or older.  The bleeding index is 'intermediate risk'.  Positive CHADS2 values include History of HTN and Age > 75 years old.  Today's INR is 1.9.  Anticoagulation responsible provider: Tenny Craw MD,Marquavius Scaife.  INR POC: 1.9.  Cuvette Lot#: [id number].    Anticoagulation Management Assessment/Plan:      [name] current anticoagulation dose is Warfarin sodium 2 mg tabs: Take one tablet by mouth at bedtime.  The next INR is due 01/24/2010.  Results were reviewed/authorized by Louann Sjogren, PharmD.  She was notified by Louann Sjogren PharmD.         Current Anticoagulation Instructions: INR 1.9 (goal 2-3)  Rifaximin will decrease INR.  Start taking 1 1/2 tablets daily until we see you next Monday (01/24/2010) at 10:15AM.

## 2010-02-23 NOTE — Progress Notes (Signed)
Summary: Question on Warfarin  Phone Note Outgoing Call   Call placed by: Dessie Coma  LPN,  February 17, 2010 1:03 PM Call placed to: Patient Summary of Call: Patient had called and informed this nurse that her surgeon had taken her off of Warfarin and put her on Plavix for 2 months after her surgery on Tuesday.  Wants to know if needs to restart Warfarin along with the Plavix.    Follow-up for Phone Call        Phone call completed:Per Dr. Kirke Corin, can remain off of Warfarin for now.  Needs to schedule a f/u with him in 2-3 weeks.  To take Plavix and ASA for now. Follow-up by: Dessie Coma  LPN,  February 17, 2010 1:38 PM

## 2010-02-23 NOTE — Letter (Signed)
Summary: [facility] Office Visit   [facility] Visit   Imported By: Roderic Ovens 02/17/2010 12:16:01  _____________________________________________________________________  External Attachment:    Type:   Image     Comment:   External Document

## 2010-02-27 NOTE — Op Note (Addendum)
NAMEMICHOL, Elizabeth Wall              ACCOUNT NO.:  [id number]  MEDICAL RECORD NO.:  [id number]          PATIENT TYPE:  OIB  LOCATION:  2507                         FACILITY:  [facility]  PHYSICIAN:  Juleen China IV, MDDATE OF BIRTH:  1919/10/20  DATE OF PROCEDURE:  02/08/2010 DATE OF DISCHARGE:                              OPERATIVE REPORT   SURGEON: 1. Durene Cal IV, MD  PREOPERATIVE DIAGNOSIS:  Chronic mesenteric ischemia.  POSTOPERATIVE DIAGNOSIS:  Chronic mesenteric ischemia.  PROCEDURES PERFORMED: 1. Ultrasound-accessed left brachial artery. 2. Abdominal aortogram. 3. Third-order catheterization of (celiac artery). 4. Third-order catheterization of (superior mesenteric artery). 5. Stent, superior mesenteric artery.  INDICATIONS:  Ms. Colasurdo is an 77 year old female who I met approximately 1 year ago when she had severe abdominal pain in the setting of hypotension, this was thought to be nonocclusive mesenteric ischemia, which was exacerbated by hypotension.  She at that time had a CT scan, which showed calcification at the origin of the superior mesenteric artery and celiac artery.  Because of her cardiac issues and the fact that her symptoms went away initially, we have been managing her nonoperatively.  However, over the past several months, the patient has had persistent abdominal pain, which was postprandial and this also associated with weight loss.  After multiple discussions, we have decided to come for arteriogram and intervention is deemed necessary.  PROCEDURE:  The patient was identify in the holding area and taken to room #8, placed supine on the table.  The left arm was prepped and draped in usual fashion.  A time-out was called.  The left brachial artery was evaluated by ultrasound, it was found to be patent.  A digital image was acquired.  Lidocaine 1% was used for local anesthesia. With full visualization of the nerve, the brachial artery was  accessed with a micropuncture needle and 0.018 wire was then advanced without resistance and a micropuncture sheath was placed.  This was followed by removal of the 0.018 wire and placement of a 0.035 Bentson wire.  This was done under fluoroscopic visualization.  The micropuncture sheath was removed and a 5-French sheath was placed.  Omniflush catheter was then advanced over the wire and directed into the descending aorta where an abdominal aortogram was performed in an AP and oblique view.  FINDINGS:  Aortogram:  In the AP projection, bilateral renal artery stents are visualized with no significant angiographic stenosis.  The inferior mesenteric artery is patent.  On the lateral projections, there is approximately 50% stenosis with the origin of the celiac artery and a high-grade approximately 90% stenosis at the origin of the superior mesenteric artery.  At this point in time, the decision was made to intervene.  A 6-French sheath was placed over a Rosen wire with the patient was fully heparinized.  She also received nitroglycerin through her sheath.  Using a multipurpose guide cath, I initially selected the celiac artery and a celiac angiogram was performed, which revealed the above-mentioned stenosis again approximately 50%.  Next, the superior mesenteric artery was cannulated.  A selective superior mesenteric angiogram was performed confirming high-grade, greater than 90% stenosis at  the ostium of the superior mesenteric artery.  I initially performed predilation with a 3- mm balloon.  I then performed primary stenting of the lesion.  This was done using a 6 x 15 Herculink Elite stent taken to nominal pressure which is 11 atmospheres.  Completion study was then performed, which revealed dramatic improvement in blood flow through the superior mesenteric artery.  At this point, decision was made to terminate the procedure.  Catheters and wires were removed.  The patient tolerated  the procedure well.  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     Jorge Ny, MD     VWB/MEDQ  D:  02/08/2010  T:  02/09/2010  Job:  [id number]  Electronically Signed by Arelia Longest IV MD on 02/27/2010 09:58:22 PM

## 2010-02-27 NOTE — Discharge Summary (Addendum)
  Elizabeth Wall, Elizabeth Wall              ACCOUNT NO.:  [id number]  MEDICAL RECORD NO.:  [id number]           PATIENT TYPE:  I  LOCATION:  2507                         FACILITY:  [facility]  PHYSICIAN:  Juleen China IV, MDDATE OF BIRTH:  18-Sep-1919  DATE OF ADMISSION:  02/08/2010 DATE OF DISCHARGE:  02/10/2010                              DISCHARGE SUMMARY   CHIEF COMPLAINT:  Left brachial artery pseudoaneurysm.  HISTORY OF PRESENT ILLNESS:  Elizabeth Wall is an 77 year old who has had abdominal pain.  She had initially presented with near-syncopal episode. She was tachycardic in 140s and started on beta-blockers and Coumadin. She developed severe abdominal pain.  CT angio for severe mesenteric stenosis and she was brought in for an SMA stent on this admission.  HOSPITAL COURSE:  She was taken to the operating room on February 08, 2010, for an aortogram, catheterization of celiac and superior mesenteric arteries with a stent placed in superior mesenteric artery. This was done to the left brachial artery.  Postoperatively, the patient developed swelling and bleeding and pseudoaneurysm at the left brachial side.  Pressure was held and she did well with the pseudoaneurysm thrombosed.  She had good feeling motion and sensation plus palpable radial pulse in the left upper extremity and she was discharged on February 10, 2010.  FINAL DIAGNOSES: 1. Left brachial artery pseudoaneurysm thrombosed status post     angiogram and superior mesenteric artery stent. 2. Atrial fibrillation and other medical issues were controlled.  PAST MEDICAL HISTORY: 1. Atrial flutter. 2. Hypertension. 3. Stroke. 4. Mitral regurgitation. 5. Reflux disease. 6. Hiatal hernia, well treated with normal medications and stable     while in-house.  She was restarted on her Coumadin and begun on Plavix and aspirin as well.  DISCHARGE MEDICATIONS: 1. Imodium for diarrhea as needed. 2. Potassium chloride 20 mEq daily. 3.  Tylenol as needed for pain. 4. Beano for gastric relief over the counter. 5. Rifaximin 550 mg twice daily. 6. Prednisone 5 mg daily. 7. Warfarin 3 mg daily. 8. Lisinopril 20 mg daily. 9. Hydrocodone as needed for pain half tablet. 10.Metoprolol 50 mg twice daily. 11.Amlodipine 10 mg daily. 12.Multivitamins daily. 13.__________ 50 mg three times a day. 14.Lasix 40 mg twice daily. 15.Aspirin 81 mg daily. 16.Plavix 75 mg daily. 17.Probiotic daily. 18.Prilosec 20 mg daily. 19.Hydroxychloroquine 200 mg daily. 20.Amiodarone 200 mg daily. 21.Levothyroxine 75 mcg daily. 22.She is also to resume her Coumadin at 3 mg daily.  FINAL DIAGNOSIS:  Pseudoaneurysm left brachial artery status post access for aortogram and SMA stent.  DISPOSITION:  The patient was discharged to home and she will follow up in Dr. Estanislado Spire office in 2 weeks.     Della Goo, PA-C   ______________________________ Seth Bake. Charlena Cross, MD    RR/MEDQ  D:  02/23/2010  T:  02/24/2010  Job:  [id number]  Electronically Signed by Della Goo PA on 02/25/2010 04:59:22 PM Electronically Signed by Arelia Longest IV MD on 02/27/2010 09:58:20 PM

## 2010-02-28 ENCOUNTER — Ambulatory Visit: Payer: Self-pay | Admitting: Cardiovascular Disease

## 2010-03-07 ENCOUNTER — Encounter: Payer: Self-pay | Admitting: Internal Medicine

## 2010-03-14 ENCOUNTER — Ambulatory Visit: Payer: Self-pay

## 2010-03-14 ENCOUNTER — Ambulatory Visit: Payer: Self-pay | Admitting: Surgery

## 2010-03-21 ENCOUNTER — Ambulatory Visit: Payer: Self-pay | Admitting: Surgery

## 2010-03-24 ENCOUNTER — Ambulatory Visit: Payer: Self-pay | Admitting: Cardiovascular Disease

## 2010-03-24 NOTE — Letter (Signed)
Summary: [employer]  [employer]   Imported By: Sherian Rein 03/17/2010 12:27:51  _____________________________________________________________________  External Attachment:    Type:   Image     Comment:   External Document

## 2010-04-06 LAB — COMPREHENSIVE METABOLIC PANEL
ALT: 36 U/L — ABNORMAL HIGH (ref 0–35)
Albumin: 3.2 g/dL — ABNORMAL LOW (ref 3.5–5.2)
Alkaline Phosphatase: 129 U/L — ABNORMAL HIGH (ref 39–117)
BUN: 44 mg/dL — ABNORMAL HIGH (ref 6–23)
CO2: 23 mEq/L (ref 19–32)
CO2: 24 mEq/L (ref 19–32)
Calcium: 8.4 mg/dL (ref 8.4–10.5)
Chloride: 96 mEq/L (ref 96–112)
Creatinine, Ser: 1.14 mg/dL (ref 0.4–1.2)
GFR calc non Af Amer: 26 mL/min — ABNORMAL LOW (ref >60)
GFR calc non Af Amer: 45 mL/min — ABNORMAL LOW (ref >60)
Glucose, Bld: 126 mg/dL — ABNORMAL HIGH (ref 70–99)
Glucose, Bld: 146 mg/dL — ABNORMAL HIGH (ref 70–99)
Potassium: 4.3 mEq/L (ref 3.5–5.1)
Total Bilirubin: 0.8 mg/dL (ref 0.3–1.2)

## 2010-04-06 LAB — CBC
HCT: 28.4 % — ABNORMAL LOW (ref 36.0–46.0)
HCT: 29.6 % — ABNORMAL LOW (ref 36.0–46.0)
HCT: 29.6 % — ABNORMAL LOW (ref 36.0–46.0)
HCT: 30.7 % — ABNORMAL LOW (ref 36.0–46.0)
Hemoglobin: 10 g/dL — ABNORMAL LOW (ref 12.0–15.0)
Hemoglobin: 10.1 g/dL — ABNORMAL LOW (ref 12.0–15.0)
Hemoglobin: 10.2 g/dL — ABNORMAL LOW (ref 12.0–15.0)
Hemoglobin: 10.5 g/dL — ABNORMAL LOW (ref 12.0–15.0)
Hemoglobin: 9.8 g/dL — ABNORMAL LOW (ref 12.0–15.0)
MCHC: 32.7 g/dL (ref 30.0–36.0)
MCHC: 34.1 g/dL (ref 30.0–36.0)
MCV: 86.9 fL (ref 78.0–100.0)
MCV: 87.3 fL (ref 78.0–100.0)
MCV: 87.4 fL (ref 78.0–100.0)
MCV: 88.8 fL (ref 78.0–100.0)
MCV: 88.8 fL (ref 78.0–100.0)
Platelets: 300 10*3/uL (ref 150–400)
Platelets: 313 10*3/uL (ref 150–400)
Platelets: 346 10*3/uL (ref 150–400)
Platelets: 395 10*3/uL (ref 150–400)
Platelets: 450 10*3/uL — ABNORMAL HIGH (ref 150–400)
RBC: 3.34 MIL/uL — ABNORMAL LOW (ref 3.87–5.11)
RBC: 3.41 MIL/uL — ABNORMAL LOW (ref 3.87–5.11)
RBC: 3.53 MIL/uL — ABNORMAL LOW (ref 3.87–5.11)
RBC: 3.54 MIL/uL — ABNORMAL LOW (ref 3.87–5.11)
RDW: 14.9 % (ref 11.5–15.5)
RDW: 15.4 % (ref 11.5–15.5)
WBC: 11.6 10*3/uL — ABNORMAL HIGH (ref 4.0–10.5)
WBC: 21 10*3/uL — ABNORMAL HIGH (ref 4.0–10.5)
WBC: 7.6 10*3/uL (ref 4.0–10.5)
WBC: 9.7 10*3/uL (ref 4.0–10.5)

## 2010-04-06 LAB — CARDIAC PANEL(CRET KIN+CKTOT+MB+TROPI)
CK, MB: 1 ng/mL (ref 0.3–4.0)
CK, MB: 1.6 ng/mL (ref 0.3–4.0)
Relative Index: INVALID (ref 0.0–2.5)
Total CK: 51 U/L (ref 7–177)
Total CK: 64 U/L (ref 7–177)
Troponin I: 0.03 ng/mL (ref 0.00–0.06)

## 2010-04-06 LAB — BASIC METABOLIC PANEL
BUN: 25 mg/dL — ABNORMAL HIGH (ref 6–23)
BUN: 28 mg/dL — ABNORMAL HIGH (ref 6–23)
BUN: 41 mg/dL — ABNORMAL HIGH (ref 6–23)
BUN: 42 mg/dL — ABNORMAL HIGH (ref 6–23)
CO2: 21 mEq/L (ref 19–32)
CO2: 26 mEq/L (ref 19–32)
Calcium: 8.7 mg/dL (ref 8.4–10.5)
Chloride: 103 mEq/L (ref 96–112)
Chloride: 104 mEq/L (ref 96–112)
Chloride: 98 mEq/L (ref 96–112)
Creatinine, Ser: 1.47 mg/dL — ABNORMAL HIGH (ref 0.4–1.2)
Creatinine, Ser: 1.71 mg/dL — ABNORMAL HIGH (ref 0.4–1.2)
GFR calc Af Amer: 40 mL/min — ABNORMAL LOW (ref >60)
GFR calc non Af Amer: 28 mL/min — ABNORMAL LOW (ref >60)
GFR calc non Af Amer: 29 mL/min — ABNORMAL LOW (ref >60)
Glucose, Bld: 102 mg/dL — ABNORMAL HIGH (ref 70–99)
Glucose, Bld: 131 mg/dL — ABNORMAL HIGH (ref 70–99)
Potassium: 4.4 mEq/L (ref 3.5–5.1)
Potassium: 4.9 mEq/L (ref 3.5–5.1)
Sodium: 129 mEq/L — ABNORMAL LOW (ref 135–145)
Sodium: 134 mEq/L — ABNORMAL LOW (ref 135–145)
Sodium: 136 mEq/L (ref 135–145)

## 2010-04-06 LAB — IGG, IGA, IGM
IgA: 237 mg/dL (ref 68–378)
IgM, Serum: 26 mg/dL — ABNORMAL LOW (ref 60–263)

## 2010-04-06 LAB — URINALYSIS, ROUTINE W REFLEX MICROSCOPIC
Bilirubin Urine: NEGATIVE
Ketones, ur: NEGATIVE mg/dL
Nitrite: NEGATIVE
Protein, ur: 100 mg/dL — AB

## 2010-04-06 LAB — DIFFERENTIAL
Basophils Absolute: 0.2 10*3/uL — ABNORMAL HIGH (ref 0.0–0.1)
Basophils Relative: 1 % (ref 0–1)
Eosinophils Absolute: 0 10*3/uL (ref 0.0–0.7)
Eosinophils Relative: 0 % (ref 0–5)
Eosinophils Relative: 0 % (ref 0–5)
Lymphocytes Relative: 13 % (ref 12–46)
Lymphs Abs: 1.2 10*3/uL (ref 0.7–4.0)
Monocytes Absolute: 0.4 10*3/uL (ref 0.1–1.0)
Monocytes Absolute: 0.5 10*3/uL (ref 0.1–1.0)
Neutro Abs: 19.1 10*3/uL — ABNORMAL HIGH (ref 1.7–7.7)

## 2010-04-06 LAB — POCT CARDIAC MARKERS
CKMB, poc: 1.1 ng/mL (ref 1.0–8.0)
Myoglobin, poc: 123 ng/mL (ref 12–200)
Troponin i, poc: <0.05 ng/mL (ref 0.00–0.09)

## 2010-04-06 LAB — LIPASE, BLOOD: Lipase: 15 U/L (ref 11–59)

## 2010-04-06 LAB — IRON AND TIBC
Iron: 23 ug/dL — ABNORMAL LOW (ref 42–135)
Saturation Ratios: 6 % — ABNORMAL LOW (ref 20–55)
TIBC: 377 ug/dL (ref 250–470)

## 2010-04-06 LAB — HEPATIC FUNCTION PANEL
Albumin: 3 g/dL — ABNORMAL LOW (ref 3.5–5.2)
Indirect Bilirubin: 0.5 mg/dL (ref 0.3–0.9)
Total Protein: 5.4 g/dL — ABNORMAL LOW (ref 6.0–8.3)

## 2010-04-06 LAB — PROTIME-INR: Prothrombin Time: 14.2 seconds (ref 11.6–15.2)

## 2010-04-06 LAB — CK TOTAL AND CKMB (NOT AT ARMC)
CK, MB: 2.8 ng/mL (ref 0.3–4.0)
Relative Index: INVALID (ref 0.0–2.5)
Total CK: 92 U/L (ref 7–177)

## 2010-04-06 LAB — AMYLASE: Amylase: 46 U/L (ref 0–105)

## 2010-04-06 LAB — SODIUM, URINE, RANDOM: Sodium, Ur: 28 mEq/L

## 2010-04-06 LAB — HEPATITIS PANEL, ACUTE
HCV Ab: REACTIVE — AB
Hep B C IgM: NEGATIVE

## 2010-04-06 LAB — HEPARIN LEVEL (UNFRACTIONATED)
Heparin Unfractionated: 0.44 IU/mL (ref 0.30–0.70)
Heparin Unfractionated: 0.49 IU/mL (ref 0.30–0.70)

## 2010-04-06 LAB — PROTEIN ELECTROPH W RFLX QUANT IMMUNOGLOBULINS
M-Spike, %: NOT DETECTED g/dL
Total Protein ELP: 5.9 g/dL — ABNORMAL LOW (ref 6.0–8.3)

## 2010-04-06 LAB — APTT: aPTT: 154 seconds — ABNORMAL HIGH (ref 24–37)

## 2010-04-06 LAB — TSH: TSH: 2.031 u[IU]/mL (ref 0.350–4.500)

## 2010-04-10 LAB — BASIC METABOLIC PANEL WITH GFR
BUN: 11 mg/dL (ref 6–23)
BUN: 14 mg/dL (ref 6–23)
BUN: 22 mg/dL (ref 6–23)
CO2: 23 meq/L (ref 19–32)
CO2: 23 meq/L (ref 19–32)
CO2: 28 meq/L (ref 19–32)
Calcium: 7.9 mg/dL — ABNORMAL LOW (ref 8.4–10.5)
Calcium: 8 mg/dL — ABNORMAL LOW (ref 8.4–10.5)
Calcium: 8.4 mg/dL (ref 8.4–10.5)
Chloride: 104 meq/L (ref 96–112)
Chloride: 104 meq/L (ref 96–112)
Chloride: 107 meq/L (ref 96–112)
Creatinine, Ser: 0.97 mg/dL (ref 0.4–1.2)
Creatinine, Ser: 1.18 mg/dL (ref 0.4–1.2)
Creatinine, Ser: 1.21 mg/dL — ABNORMAL HIGH (ref 0.4–1.2)
GFR calc non Af Amer: 42 mL/min — ABNORMAL LOW
GFR calc non Af Amer: 43 mL/min — ABNORMAL LOW
GFR calc non Af Amer: 54 mL/min — ABNORMAL LOW
Glucose, Bld: 107 mg/dL — ABNORMAL HIGH (ref 70–99)
Glucose, Bld: 123 mg/dL — ABNORMAL HIGH (ref 70–99)
Glucose, Bld: 171 mg/dL — ABNORMAL HIGH (ref 70–99)
Potassium: 3 meq/L — ABNORMAL LOW (ref 3.5–5.1)
Potassium: 3.3 meq/L — ABNORMAL LOW (ref 3.5–5.1)
Potassium: 3.6 meq/L (ref 3.5–5.1)
Sodium: 135 meq/L (ref 135–145)
Sodium: 137 meq/L (ref 135–145)
Sodium: 138 meq/L (ref 135–145)

## 2010-04-10 LAB — CBC
HCT: 31.3 % — ABNORMAL LOW (ref 36.0–46.0)
HCT: 32.7 % — ABNORMAL LOW (ref 36.0–46.0)
Hemoglobin: 10.6 g/dL — ABNORMAL LOW (ref 12.0–15.0)
Hemoglobin: 11 g/dL — ABNORMAL LOW (ref 12.0–15.0)
Hemoglobin: 8.6 g/dL — ABNORMAL LOW (ref 12.0–15.0)
Hemoglobin: 9.8 g/dL — ABNORMAL LOW (ref 12.0–15.0)
MCHC: 33.7 g/dL (ref 30.0–36.0)
MCHC: 34 g/dL (ref 30.0–36.0)
MCHC: 34 g/dL (ref 30.0–36.0)
MCHC: 34.2 g/dL (ref 30.0–36.0)
MCV: 86.8 fL (ref 78.0–100.0)
MCV: 87.3 fL (ref 78.0–100.0)
Platelets: 392 10*3/uL (ref 150–400)
Platelets: 427 10*3/uL — ABNORMAL HIGH (ref 150–400)
RBC: 2.92 MIL/uL — ABNORMAL LOW (ref 3.87–5.11)
RBC: 3.3 MIL/uL — ABNORMAL LOW (ref 3.87–5.11)
RBC: 3.61 MIL/uL — ABNORMAL LOW (ref 3.87–5.11)
RBC: 3.75 MIL/uL — ABNORMAL LOW (ref 3.87–5.11)
RDW: 15.1 % (ref 11.5–15.5)
RDW: 15.1 % (ref 11.5–15.5)
WBC: 12.1 10*3/uL — ABNORMAL HIGH (ref 4.0–10.5)
WBC: 7.4 10*3/uL (ref 4.0–10.5)
WBC: 7.9 10*3/uL (ref 4.0–10.5)
WBC: 8.9 10*3/uL (ref 4.0–10.5)

## 2010-04-10 LAB — COMPREHENSIVE METABOLIC PANEL WITH GFR
ALT: 29 U/L (ref 0–35)
AST: 16 U/L (ref 0–37)
Albumin: 2.5 g/dL — ABNORMAL LOW (ref 3.5–5.2)
Alkaline Phosphatase: 71 U/L (ref 39–117)
BUN: 10 mg/dL (ref 6–23)
CO2: 25 meq/L (ref 19–32)
Calcium: 8.2 mg/dL — ABNORMAL LOW (ref 8.4–10.5)
Chloride: 106 meq/L (ref 96–112)
Creatinine, Ser: 1.21 mg/dL — ABNORMAL HIGH (ref 0.4–1.2)
GFR calc non Af Amer: 42 mL/min — ABNORMAL LOW
Glucose, Bld: 103 mg/dL — ABNORMAL HIGH (ref 70–99)
Potassium: 3.6 meq/L (ref 3.5–5.1)
Sodium: 137 meq/L (ref 135–145)
Total Bilirubin: 0.7 mg/dL (ref 0.3–1.2)
Total Protein: 4.9 g/dL — ABNORMAL LOW (ref 6.0–8.3)

## 2010-04-10 LAB — HEPARIN LEVEL (UNFRACTIONATED)

## 2010-04-10 LAB — ABO/RH: ABO/RH(D): A NEG

## 2010-04-10 LAB — TYPE AND SCREEN
ABO/RH(D): A NEG
Antibody Screen: NEGATIVE

## 2010-04-10 LAB — PREPARE RBC (CROSSMATCH)

## 2010-04-11 ENCOUNTER — Ambulatory Visit (INDEPENDENT_AMBULATORY_CARE_PROVIDER_SITE_OTHER): Payer: Medicare Other | Admitting: Surgery

## 2010-04-11 ENCOUNTER — Encounter (INDEPENDENT_AMBULATORY_CARE_PROVIDER_SITE_OTHER): Payer: Medicare Other

## 2010-04-11 DIAGNOSIS — K551 Chronic vascular disorders of intestine: Secondary | ICD-10-CM

## 2010-04-11 DIAGNOSIS — Z48812 Encounter for surgical aftercare following surgery on the circulatory system: Secondary | ICD-10-CM

## 2010-04-11 NOTE — Assessment & Plan Note (Signed)
OFFICE VISIT  LORALIE, MALTA DOB:  1919/07/29                                       04/11/2010 [id number]#:[id number]  The patient comes back in today for followup.  She is status post superior mesenteric artery stenting in January 2011.  She had immediate improvement in her ability to eat.  She is no longer taking Beano.  She has gained approximately 13 pounds over the past year.  We have been following her with ultrasound which has suggested in-stent stenosis. Again today she shows a velocity of 491 cm/sec.  The patient symptomatically is doing very well.  However, velocity profile on ultrasound has increased.  For that reason I feel like we need more confirmative imaging to document the degree of stenosis and to see if the ultrasound is accurate.  The patient had a complication from her initial angiogram when I came from the arm and therefore I would like to avoid invasive imaging if at all possible.  I am going to send her for a CT angiogram to evaluate for in-stent stenosis.  This will be done within the next 2 weeks.  If this appears to be significant I would need to entertain repeat angiography.  This time most likely I would be able to come from the groin.  She is going to follow up with me in 2 weeks.    Jorge Ny, MD Electronically Signed  VWB/MEDQ  D:  04/11/2010  T:  04/11/2010  Job:  [id number]

## 2010-04-12 ENCOUNTER — Other Ambulatory Visit: Payer: Self-pay | Admitting: Surgery

## 2010-04-12 ENCOUNTER — Other Ambulatory Visit: Payer: Self-pay | Admitting: Internal Medicine

## 2010-04-21 LAB — URINALYSIS, ROUTINE W REFLEX MICROSCOPIC
Bilirubin Urine: NEGATIVE
Glucose, UA: NEGATIVE mg/dL
Ketones, ur: NEGATIVE mg/dL
Leukocytes, UA: NEGATIVE
Nitrite: NEGATIVE
Protein, ur: 100 mg/dL — AB
Specific Gravity, Urine: 1.008 (ref 1.005–1.030)
Urobilinogen, UA: 0.2 mg/dL (ref 0.0–1.0)
pH: 6 (ref 5.0–8.0)

## 2010-04-21 LAB — POCT I-STAT, CHEM 8
BUN: 39 mg/dL — ABNORMAL HIGH (ref 6–23)
Calcium, Ion: 1.14 mmol/L (ref 1.12–1.32)
Chloride: 106 mEq/L (ref 96–112)
Creatinine, Ser: 1.4 mg/dL — ABNORMAL HIGH (ref 0.4–1.2)
Glucose, Bld: 134 mg/dL — ABNORMAL HIGH (ref 70–99)
HCT: 39 % (ref 36.0–46.0)
Hemoglobin: 13.3 g/dL (ref 12.0–15.0)
Potassium: 3.6 meq/L (ref 3.5–5.1)
Sodium: 142 meq/L (ref 135–145)
TCO2: 24 mmol/L (ref 0–100)

## 2010-04-21 LAB — DIFFERENTIAL
Basophils Absolute: 0 10*3/uL (ref 0.0–0.1)
Basophils Relative: 0 % (ref 0–1)
Eosinophils Absolute: 0 K/uL (ref 0.0–0.7)
Eosinophils Relative: 0 % (ref 0–5)
Lymphocytes Relative: 13 % (ref 12–46)
Lymphs Abs: 1.7 K/uL (ref 0.7–4.0)
Monocytes Absolute: 0.5 10*3/uL (ref 0.1–1.0)
Monocytes Relative: 4 % (ref 3–12)
Neutro Abs: 10.8 K/uL — ABNORMAL HIGH (ref 1.7–7.7)
Neutrophils Relative %: 83 % — ABNORMAL HIGH (ref 43–77)

## 2010-04-21 LAB — CBC
HCT: 39 % (ref 36.0–46.0)
Hemoglobin: 12.9 g/dL (ref 12.0–15.0)
MCHC: 33 g/dL (ref 30.0–36.0)
MCV: 92.8 fL (ref 78.0–100.0)
Platelets: 611 K/uL — ABNORMAL HIGH (ref 150–400)
RBC: 4.21 MIL/uL (ref 3.87–5.11)
RDW: 12.4 % (ref 11.5–15.5)
WBC: 13 K/uL — ABNORMAL HIGH (ref 4.0–10.5)

## 2010-04-21 LAB — URINE MICROSCOPIC-ADD ON

## 2010-04-25 ENCOUNTER — Ambulatory Visit
Admission: RE | Admit: 2010-04-25 | Discharge: 2010-04-25 | Disposition: A | Discharge status: Home / Self Care | Payer: Medicare Other | Source: Ambulatory Visit | Attending: Surgery | Admitting: Surgery

## 2010-04-25 ENCOUNTER — Ambulatory Visit: Payer: Medicare PPO | Admitting: Cardiovascular Disease

## 2010-04-25 ENCOUNTER — Ambulatory Visit: Payer: Medicare Other | Admitting: Surgery

## 2010-04-26 ENCOUNTER — Encounter: Payer: Self-pay | Admitting: Cardiovascular Disease

## 2010-04-28 ENCOUNTER — Encounter: Payer: Self-pay | Admitting: Cardiovascular Disease

## 2010-04-28 ENCOUNTER — Ambulatory Visit (INDEPENDENT_AMBULATORY_CARE_PROVIDER_SITE_OTHER): Payer: Medicare Other | Admitting: Cardiovascular Disease

## 2010-04-28 DIAGNOSIS — I4892 Unspecified atrial flutter: Secondary | ICD-10-CM

## 2010-04-28 DIAGNOSIS — I10 Essential (primary) hypertension: Secondary | ICD-10-CM

## 2010-04-28 NOTE — Assessment & Plan Note (Signed)
She had a successful ablation procedure last year. Since then, She had no recurrent episodes. She prefers no to go back on Warfarin due to difficulty in getting her INR checked.  Given that her rhythm was atrial flutter which was treated with ablation without any further arrhythmia, I think it is reasonable to continue treatment with Aspirin and Plavix.  I plan of taking her off Amiodarone in near future.

## 2010-04-28 NOTE — Progress Notes (Signed)
HPI  Ms. Bartley is an 77 y/o female who is here for a follow up visit. Since her last visit she underwent angioplasty and stent placement of mesenteric artery by Dr. Myra Gianotti in January. She had significant improvement in her GI symptoms. She gained 12 lbs since then. She denied any chest pain or dyspnea. She has been no palpitations or documented arrhythmia. She was taken off Warfarin and Placed on Plavix after her stent. No bleeding complications except for bruising in her arms.   Allergies  Allergen Reactions  . Augmentin   . Avelox (Moxifloxacin Hcl In Nacl)   . UJW:JXBJYNWGNFA+OZHYQMVHQ+IONGEXBMWU Acid+Aspartame   . Moxifloxacin     REACTION: patient developed tremors and vibrato  . Sulfa Antibiotics   . Sulfamethoxazole     REACTION: unspecified  . Sulfonamide Derivatives      Current Outpatient Prescriptions on File Prior to Visit  Medication Sig Dispense Refill  . amiodarone (PACERONE) 200 MG tablet Take 200 mg by mouth daily.        Marland Kitchen amLODipine (NORVASC) 10 MG tablet Take 10 mg by mouth daily.        Marland Kitchen aspirin 81 MG tablet Take 81 mg by mouth daily.        . furosemide (LASIX) 40 MG tablet Take 40 mg by mouth 2 (two) times daily.        . hydrALAZINE (APRESOLINE) 50 MG tablet Take 50 mg by mouth 3 (three) times daily.        Marland Kitchen HYDROcodone-acetaminophen (NORCO) 5-325 MG per tablet Take 1 tablet by mouth at bedtime as needed. Take 1/2 tablet at bedtime as needed       . levothyroxine (SYNTHROID, LEVOTHROID) 75 MCG tablet Take 75 mcg by mouth daily.        Marland Kitchen lisinopril (PRINIVIL,ZESTRIL) 20 MG tablet Take 20 mg by mouth daily.        . metoprolol (LOPRESSOR) 50 MG tablet Take 50 mg by mouth 2 (two) times daily.        . Multiple Vitamin (MULTIVITAMIN) capsule Take 1 capsule by mouth daily. Take 1 tablet daily       . omeprazole (PRILOSEC) 20 MG capsule Take 20 mg by mouth daily.        . Probiotic Product (PROBIOTIC PO) Take 1 tablet by mouth daily.        . predniSONE (DELTASONE)  5 MG tablet Take 5 mg by mouth daily as needed.        Marland Kitchen DISCONTD: Alpha-D-Galactosidase (BEANO) TABS Take 1 tablet by mouth as needed.        Marland Kitchen DISCONTD: triamcinolone (KENALOG) 0.1 % cream Apply 1 application topically as needed.        Marland Kitchen DISCONTD: warfarin (COUMADIN) 2 MG tablet Take 2 mg by mouth daily. Take as directed          Past Medical History  Diagnosis Date  . Chronic kidney disease     chronic  . CVA (cerebral vascular accident)   . Cardiomyopathy     EF 40%. likely tachycardia induced  . Dyspepsia   . Peripheral vascular disease   . 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 Thyroid disease     hypothyroidism  . Atrial flutter March 2011    s/p ablation   . Hypertension   . Renovascular hypertension     s/p stenting     Past Surgical History  Procedure Date  . Cardiac  catheterization   . Cholecystectomy     laporascopic  . Abdominal hysterectomy   . Oophorectomy   . Tonsillectomy   . Ras stents 2005    bilateral     No family history on file.   History   Social History  . Marital Status: Widowed    Spouse Name: N/A    Number of Children: N/A  . Years of Education: N/A   Occupational History  . Not on file.   Social History Main Topics  . Smoking status: Never Smoker   . Smokeless tobacco: Not on file  . Alcohol Use: Not on file  . Drug Use: Not on file  . Sexually Active: Not on file   Other Topics Concern  . Not on file   Social History Narrative  . No narrative on file     ROS Constitutional: Negative for fever, chills, diaphoresis, activity change, appetite change and fatigue.  HENT: Negative for hearing loss, nosebleeds, congestion, sore throat, facial swelling, drooling, trouble swallowing, neck pain, voice change, sinus pressure and tinnitus.  Eyes: Negative for photophobia, pain, discharge and visual disturbance.  Respiratory: Negative for apnea, cough, chest tightness, shortness of breath and  wheezing.  Cardiovascular: Negative for chest pain, palpitations and leg swelling.  Gastrointestinal: Negative for nausea, vomiting, abdominal pain, diarrhea, constipation, blood in stool and abdominal distention.  Genitourinary: Negative for dysuria, urgency, frequency, hematuria and decreased urine volume.  Musculoskeletal: Negative for myalgias, back pain and gait problem.  Skin: Negative for color change, pallor, rash and wound.  Neurological: Negative for dizziness, tremors, seizures, syncope, speech difficulty, weakness, light-headedness, numbness and headaches.  Psychiatric/Behavioral: Negative for suicidal ideas, hallucinations, behavioral problems and agitation. The patient is not nervous/anxious.     PHYSICAL EXAM   BP 162/76  Pulse 67  Wt 128 lb (58.06 kg)  SpO2 96%  Constitutional: She is oriented to person, place, and time. She appears well-developed and well-nourished. No distress.  HENT: No nasal discharge.  Head: Normocephalic and atraumatic.  Eyes: Pupils are equal, round, and reactive to light. Right eye exhibits no discharge. Left eye exhibits no discharge.  Neck: Normal range of motion. Neck supple. No JVD present. No thyromegaly present.  Cardiovascular: Normal rate, regular rhythm, normal heart sounds and intact distal pulses. Exam reveals no gallop and no friction rub.  There is a 1/6 SEM at the aortic area.  Pulmonary/Chest: Effort normal and breath sounds normal. No stridor. No respiratory distress. She has no wheezes. She has no rales. She exhibits no tenderness.  Abdominal: Soft. Bowel sounds are normal. She exhibits no distension. There is no tenderness. There is no rebound and no guarding.  Musculoskeletal: Normal range of motion. She exhibits no edema and no tenderness.  Neurological: She is alert and oriented to person, place, and time. Coordination normal.  Skin: Skin is warm and dry. No rash noted. She is not diaphoretic. No erythema. No pallor.    Psychiatric: She has a normal mood and affect. Her behavior is normal. Judgment and thought content normal.     ASSESSMENT AND PLAN

## 2010-04-28 NOTE — Assessment & Plan Note (Signed)
Her blood pressure is elevated but home blood pressure reading are actually better with SBP of 130-150. She does not feel well when her BP is lower than that. Thus, will continue with current therapy without changes.  She does have previous history of renal artery stenting but will like not pursue invasive evaluation due to age and chronic kidney disease.

## 2010-05-02 ENCOUNTER — Ambulatory Visit (INDEPENDENT_AMBULATORY_CARE_PROVIDER_SITE_OTHER): Payer: Medicare Other | Admitting: Surgery

## 2010-05-02 ENCOUNTER — Ambulatory Visit
Admission: RE | Admit: 2010-05-02 | Discharge: 2010-05-02 | Disposition: A | Discharge status: Home / Self Care | Payer: Medicare Other | Source: Ambulatory Visit | Attending: Surgery | Admitting: Surgery

## 2010-05-02 DIAGNOSIS — I771 Stricture of artery: Secondary | ICD-10-CM

## 2010-05-03 NOTE — Assessment & Plan Note (Signed)
OFFICE VISIT  LAKECIA, Elizabeth Wall DOB:  September 19, 1919                                       05/02/2010 QQVZD#:[id number]  [name] comes back today for follow-up.  She has had a mesenteric stent placed for mesenteric stenosis.  This was done in January 2011. She has had marked improvement in ability to eat and is now gaining weight.  Ultrasound has detected the stenosis.  I have tried to evaluate this with CT scan.  However, because of her worsening renal insufficiency, I have been unable to get a CT scan.  She comes back in today for further discussion.  She continues to be without symptoms.  I think the next best course of action is to proceed with arteriogram.  I discussed this with her today.  My plan is to bring in the day before the procedure, hydrate her overnight with gentle hydration, do her procedure on Tuesday, the 24th, and monitor there overnight for renal protection.  I will plan on accessing the right groin and evaluating her stents and intervening if necessary.  I spent approximately 45 minutes on the details of this with the patient and her son.  I plan on calling her other son who is an interventional radiologist in [place] later on tonight.    Jorge Ny, MD Electronically Signed  VWB/MEDQ  D:  05/02/2010  T:  05/03/2010  Job:  [id number]

## 2010-05-09 ENCOUNTER — Ambulatory Visit (HOSPITAL_COMMUNITY)
Admission: RE | Admit: 2010-05-09 | Discharge: 2010-05-11 | DRG: 253 | Disposition: A | Discharge status: Home / Self Care | Payer: Medicare Other | Source: Ambulatory Visit | Attending: Surgery | Admitting: Surgery

## 2010-05-09 DIAGNOSIS — Z79899 Other long term (current) drug therapy: Secondary | ICD-10-CM | POA: Insufficient documentation

## 2010-05-09 DIAGNOSIS — Y831 Surgical operation with implant of artificial internal device as the cause of abnormal reaction of the patient, or of later complication, without mention of misadventure at the time of the procedure: Secondary | ICD-10-CM | POA: Insufficient documentation

## 2010-05-09 DIAGNOSIS — K551 Chronic vascular disorders of intestine: Secondary | ICD-10-CM | POA: Insufficient documentation

## 2010-05-09 DIAGNOSIS — T82898A Other specified complication of vascular prosthetic devices, implants and grafts, initial encounter: Secondary | ICD-10-CM | POA: Insufficient documentation

## 2010-05-10 DIAGNOSIS — K551 Chronic vascular disorders of intestine: Secondary | ICD-10-CM

## 2010-05-10 LAB — BASIC METABOLIC PANEL
CO2: 29 mEq/L (ref 19–32)
Calcium: 8.7 mg/dL (ref 8.4–10.5)
Chloride: 107 mEq/L (ref 96–112)
GFR calc non Af Amer: 31 mL/min — ABNORMAL LOW (ref >60)
Potassium: 4.5 mEq/L (ref 3.5–5.1)

## 2010-05-10 LAB — CBC
Hemoglobin: 9.3 g/dL — ABNORMAL LOW (ref 12.0–15.0)
MCH: 23.5 pg — ABNORMAL LOW (ref 26.0–34.0)
MCV: 79 fL (ref 78.0–100.0)
Platelets: 336 10*3/uL (ref 150–400)
RBC: 3.96 MIL/uL (ref 3.87–5.11)
WBC: 7.1 10*3/uL (ref 4.0–10.5)

## 2010-05-10 NOTE — Procedures (Unsigned)
MESENTERIC ARTERIAL DUPLEX EVALUATION  INDICATION:  Followup SMA stenting.  HISTORY: Diabetes:  No. Cardiac:  Atrial flutters, atrial fibrillation. Hypertension:  Yes. Smoking:  No.  Mesenteric Duplex Findings: Aorta - Proximal                            1.22 m/s Aorta - Mid                                 1.90 m/s Aorta - Distal                              1.54 m/s  Celiac Trunk - Proximal                     1.59 m/s Celiac Trunk - Distal                       1.18 m/s  Hepatic Artery                              0.58 m/s Splenic Artery                              0.80 m/s  Superior Mesenteric Artery-Origin           4.91 m/s Superior Mesenteric Artery-Proximal         4.11 m/s Superior Mesenteric Artery-Mid              4.3 m/s Superior Mesenteric Artery-Distal           2.8 m/s  Inferior Mesenteric Artery-Proximal         Not visualized  IMPRESSION:  Continued >60% superior mesenteric artery ostial stenosis. Study is stable compared to previous with moderate atherosclerosis within the proximal aorta with mildly elevated velocities.  The patient is gaining weight with no postprandial problems.  ___________________________________________ V. Charlena Cross, MD  OD/MEDQ  D:  04/11/2010  T:  04/11/2010  Job:  [id number]

## 2010-05-11 DIAGNOSIS — I6529 Occlusion and stenosis of unspecified carotid artery: Secondary | ICD-10-CM

## 2010-05-11 LAB — BASIC METABOLIC PANEL
BUN: 36 mg/dL — ABNORMAL HIGH (ref 6–23)
Calcium: 8.5 mg/dL (ref 8.4–10.5)
Creatinine, Ser: 1.68 mg/dL — ABNORMAL HIGH (ref 0.4–1.2)
GFR calc non Af Amer: 29 mL/min — ABNORMAL LOW (ref >60)
Potassium: 4.2 mEq/L (ref 3.5–5.1)

## 2010-05-11 LAB — CBC
MCV: 79.1 fL (ref 78.0–100.0)
Platelets: 315 10*3/uL (ref 150–400)
RDW: 16.3 % — ABNORMAL HIGH (ref 11.5–15.5)
WBC: 7.7 10*3/uL (ref 4.0–10.5)

## 2010-05-30 NOTE — Op Note (Signed)
NAMETEMILOLUWA, RECCHIA              ACCOUNT NO.:  [id number]  MEDICAL RECORD NO.:  [id number]           PATIENT TYPE:  I  LOCATION:  3040                         FACILITY:  [facility]  PHYSICIAN:  Juleen China IV, MDDATE OF BIRTH:  03/23/1919  DATE OF PROCEDURE:  05/10/2010 DATE OF DISCHARGE:                              OPERATIVE REPORT   PREOPERATIVE DIAGNOSIS:  In-stent stenosis, superior mesenteric artery stent.  POSTOPERATIVE DIAGNOSIS:  In-stent stenosis,  superior mesenteric artery stent.  PROCEDURE PERFORMED: 1. Ultrasound access right femoral artery. 2. Abdominal aortogram. 3. First-order catheterization (superior mesenteric artery stent). 4. Superior mesenteric artery angiogram. 5. Angioplasty superior mesenteric artery. 6. Stent superior mesenteric artery.  INDICATIONS:  This is an 77 year old female with chronic mesenteric ischemia.  She has undergone stenting of her superior mesenteric artery with resolution of her symptoms, however, by ultrasound, she has developed progressive stenosis.  She was admitted for hydration as she has had renal insufficiency, her creatinine on the day of the  procedure was 1.6.  Total noncontrast used for the procedure was 25 mL.  PROCEDURE:  The patient identified in the holding area, taken to room 8, and placed supine on the table.  The right groin was prepped and draped in usual fashion.  Time-out was called.  Ultrasound was used to evaluate the femoral artery.  It was without significant plaque and widely patent.  An ultrasound digital image was acquired.  The right femoral artery was then accessed under ultrasound guidance with a micropuncture needle and 0.18 wire was then advanced without resistance and micropuncture sheath was placed.  The introducer and micropuncture wire were removed and a Bentson wire was placed followed by a 6-French sheath.  Over the Bentson wire, a soft Omni catheter was advanced up to the superior  mesenteric artery.  The image intensifier was rotated to a lateral position.  I selectively cannulated the stent in the superior mesenteric artery and performed a superior mesenteric artery angiogram. This showed a patent distal vessel.  I then checked a pressure gradient across the proximal portion of the stent and there was a 45-mm gradient. With this, I elected to proceed with intervention.  The patient was heparinized.  The soft catheter was removed and a JR-4 guide cath was advanced up to the origin of the superior mesenteric artery stent. Angioplasty was then performed using a 6 x 2 balloon taken to a nominal pressure.  Followup study was performed which showed widely patent superior mesenteric artery stent.  I elected at this point to check the gradient.  I checked the gradient across the origin of the stent with a straight catheter.  There still was a 20-30 mm gradient.  At this point, I felt that the proximal portion of the stent needed to be re-stented over a Spartacore wire.  A 6 x 12 Herculink stent was advanced, it was positioned so that it was about 2 mm further proximal end of the aorta than the previous stent.  It was deployed and taken to nominal pressure. A followup study was performed, it showed widely patent stent.  There was a hypodense area at  the origin, I checked the pressure gradient, it was down to approximately 15.  I reballooned this area with no change in the pressure gradient.  At this point, I felt this was adequate result and elected to terminate the procedure.  The groin was closed using a ProGlide device.  It was hemostatic upon deployment.  The patient was taken to the holding area.  IMPRESSION: 1. In-stent stenosis, superior mesenteric artery stent. 2. Suboptimal result with angioplasty necessitating stent placement in     the superior mesenteric artery.  The stent was a 6 x 12 Herculink     deployed approximately 2 mm proximal to her previous  stent. 3. Closure device (ProGlide).     Jorge Ny, MD     VWB/MEDQ  D:  05/10/2010  T:  05/10/2010  Job:  [id number]  Electronically Signed by Arelia Longest IV MD on 05/30/2010 10:56:02 PM

## 2010-05-31 ENCOUNTER — Other Ambulatory Visit: Payer: Self-pay | Admitting: Internal Medicine

## 2010-05-31 NOTE — Assessment & Plan Note (Signed)
[facility] CARDIOLOGY OFFICE NOTE   GILDA, ABBOUD                     MRN:          [id number]  DATE:11/05/2009                            DOB:          February 14, 1919    Mr. Lukasik is an 77 year old female who is here today for a followup  visit.  She has the following problem list:  1. History of refractory hypertension with chronic kidney disease and      previous renal artery stenosis.  2. History of bilaterally renal artery stenosis, status post bilateral      renal artery stenting in 2006.  3. Atrial flutter, status post ablation in March 2011.  4. History of cerebrovascular accident.  5. History of cardiomyopathy with most recent ejection fraction of      40%.  6. 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   INTERVAL HISTORY:  Mr. Cleaver is here today for a followup visit.  Her  blood pressure remains reasonably controlled with home blood pressure  readings ranging from 140-150 systolic.  She is still having significant  issues with her abdominal discomfort.  She has significant bloating.  She feels better after expelling gas before she eats meals.  She  continues to have some nausea and heartburn.  Her symptoms has not  improved after increasing the dose of PPI.  She was evaluated by GI  again who did not feel that a repeat EGD would be helpful.  She  continues to take Pradaxa for anticoagulation.   PHYSICAL EXAMINATION:  VITAL SIGNS:  Weight is 119.8 pounds, blood  pressure is 152/66, pulse is 58, oxygen saturation is 97% on room air.  NECK:  No JVD or carotid bruits.  LUNGS:  Clear to auscultation.  HEART:  Regular rate and rhythm with no gallops.  There is a 1/6  systolic ejection murmur at the aortic area.  ABDOMEN:  Benign, nontender, nondistended.  EXTREMITIES:  With trace edema bilaterally.   IMPRESSION:  1. Dyspepsia and continued gastrointestinal  symptoms of heartburn and      nausea:  She still has some postprandial symptoms at the same time.      I still believe that her symptoms are likely medication related at      this time.  The most likely cause it could be Pradaxa actually      which was started in March.  It seems that most of her symptoms has      progressed since then.  Due to that, I recommend stopping Pradaxa      and switching her to anticoagulation with warfarin.  She      understands that warfarin will require frequent checks of her INR      and she is willing to do that.  We will start her on small dose 2      mg at bedtime given her age and also the fact that she is on      amiodarone.  We will check her INR in 5 days from now.  I will ask  her to take Pradaxa for another 3 days until INR is close to a      therapeutic range.  2. Hypertension:  Blood pressure is slightly elevated today, but home      readings have been in a good range.  We will continue with current      medications.  3. Atrial flutter, status post ablation:  We will continue with      amiodarone once daily.  As stated above, we will switch Pradaxa to      warfarin due to dyspepsia.  4. Peripheral vascular disease with stenosis of the superior      mesenteric artery.  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  There was      also more than 70% stenosis in the IMA.  Although, some of her      symptoms are suggestive of abdominal angina:  The majority of her      symptoms seems to be more suggestive of dyspepsia.  Thus, I think      the first step is to try to work on her medications and see if that      will make her symptoms better.  Obviously with her age and chronic      kidney disease, I do not think proceeding with angioplasty of the      mesenteric artery will be without significant risk.  The patient       will come back for reevaluation in 1 month.     Lorine Bears, MD  Electronically Signed    MA/MedQ  DD: 11/05/2009  DT: 11/06/2009  Job #: [id number]

## 2010-05-31 NOTE — Assessment & Plan Note (Signed)
OFFICE VISIT   TANDRA, ROSADO  DOB:  [birth year]                                       04/12/2009  EAVWU#:[id number]   REASON FOR VISIT:  Follow up abdominal pain.   HISTORY:  Patient is an 77 year old female that I saw in the hospital in  late February for abdominal pain.  She came into the emergency  department for a near-syncopal episode and was found to be tachycardic  in the 140s.  She is given medication to lower her heart rate, and this  produced hypotension.  With her hypertension, she had severe abdominal  pain.  A lactate at that time was obtained, and it was 6.  CT scan  showed stenosis within her mesenteric vessels.  With hydration and  increasing her blood pressure, her pain resolved.  She has undergone  cardiac ablation for atrial fibrillation, and this has been stable since  she has gone home.  Since she has been at home, she is eating 3 small  meals a day plus a snack.  She has only had 1 episode of severe  abdominal pain.  She states that she can take Beano, which helps with  gas, and if she can burp and pass flatus, she does not have problems  with eating.  She has been gaining weight while at home.   REVIEW OF SYSTEMS:  PULMONARY:  Positive for wheezing.  GI:  Positive for hiatal hernia.  VASCULAR:  Positive for stroke.  MUSCULOSKELETAL:  Positive for arthritis and joint pain.  ENT:  Positive for recent change in eyesight.  HEME:  Positive for anemia.  All other review of systems are negative, as documented in the encounter  form.   PHYSICAL EXAMINATION:  Heart rate is 76, blood pressure 186/75, O2 sat  is 100, temperature 97.6.  General:  She is well-appearing in no  distress.  HEENT:  Within normal limits.  Lungs are clear bilaterally.  Cardiovascular:  Regular rate and rhythm.  No murmur.  Abdomen is soft,  nontender.  Musculoskeletal:  Without major deformities.  Neuro:  She  has no focal weaknesses.  Skin is without rash.   DIAGNOSTIC STUDIES:  Duplex was performed today, which reveals greater  than 60% stenosis within the celiac and superior mesenteric artery.   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   PLAN:  The patient has been doing well since she has been at home.  She  is now eating regular meals and is gaining weight.  At this time, I do  not feel intervention is warranted.  If, however, her symptoms get  worse, we can consider mesenteric stenting.  I will plan on seeing her  back in 6 months with a repeat ultrasound.     Jorge Ny, MD  Electronically Signed   VWB/MEDQ  D:  04/12/2009  T:  04/13/2009  Job:  [id number]

## 2010-05-31 NOTE — Assessment & Plan Note (Signed)
[facility]                        [place] CARDIOLOGY OFFICE NOTE   Elizabeth Wall, Elizabeth Wall                     MRN:          [id number]  DATE:06/18/2009                            DOB:          07/25/1919    PROBLEM LIST:  1. Resistant hypertension.  2. Bilateral renal artery stenosis status post bilateral renal artery      stenting.  3. Atrial flutter status post atrial flutter ablation.  4. History of cerebrovascular accident.   INTERVAL HISTORY:  The patient just returned from a wedding in [place].  She states that her blood pressure has been sometimes over 200 systolic.  She has been compliant with her medications and has been taking  clonidine p.r.n.  She occasionally has headaches associated with the  elevations in her blood pressure.  The patient also complains of a  several-week history of cough that is now worsening with production of  greenish sputum.  She denies any fever, but has had some chills.   PHYSICAL EXAMINATION:  VITAL SIGNS:  The patient is afebrile.  Today,  her blood pressure is 194/84, pulse 71, satting 97% on room air.  She  weighs 124 pounds.  GENERAL:  No acute distress.  HEENT:  Normocephalic, atraumatic.  HEART:  Regular rate and rhythm without murmur, rub or gallop.  LUNGS:  Clear bilaterally.  ABDOMEN:  Soft, nontender, nondistended.  EXTREMITIES:  1+ bilateral lower extremity edema.   ASSESSMENT AND PLAN:  1. Hypertension.  The patient is taking a Lopressor 50 mg b.i.d. and      lisinopril 20 mg daily, in addition hydralazine 20 mg t.i.d.      Today, we will increase her hydralazine to 50 mg 3 times a day.      She will contact us in 1 week's time to let us know how the blood      pressures are doing.  2. Cough.  The patient has been a cough that has been worsening and      now has become more productive with a greenish sputum.  She is      afebrile in the office today.  We will check a PA and lateral chest    x-ray and place her on a Z-Pak.  Of note, she has had a bad      reaction to Avelox in the past.   We will see the patient back in 6 weeks' time.     Brayton El, MD  Electronically Signed    SGA/MedQ  DD: 06/18/2009  DT: 06/18/2009  Job #: [id number]

## 2010-05-31 NOTE — Assessment & Plan Note (Signed)
[facility]                        [place] CARDIOLOGY OFFICE NOTE   MARAGRET, VANACKER                     MRN:          [id number]  DATE:07/27/2009                            DOB:          15-Mar-1919    PROBLEM LIST:  1. Resistant hypertension with chronic kidney disease.  2. History of bilateral renal artery stenosis, status post bilateral      renal artery stenting in 2006.  3. Atrial flutter, status post atrial flutter ablation in March 2011.  4. History of cerebrovascular accident.  5. History of cardiomyopathy with most recent ejection fraction of      40%.  6. 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   INTERVAL HISTORY:  The patient is here today for followup visit.  During  her last visit with Dr. Freida Busman, she was hypertensive with blood pressure  of 194 systolic.  She was started on hydralazine.  She also has been  taking furosemide twice daily.  Since then, her blood pressure has been  better controlled.  According to her home blood pressure readings, her  systolic blood pressure has been ranging from 130-160 which according to  the patient, is an optimal for her in terms of how she feels.  She  denies any chest pain, dyspnea, or palpitations.  Overall she seems to  be doing reasonably well.   PHYSICAL EXAMINATION:  VITAL SIGNS:  Weight of 119, blood pressure is  174/76 in the left arm, pulse is 69 beats per minute.  Oxygen saturation  is 97% on room air.  NECK:  No JVD.  She does have a faint carotid bruit in the left side.  LUNGS:  Clear to auscultation.  CARDIOVASCULAR:  Regular rate and rhythm with occasional premature  beats.  There is a 1/6 systolic ejection murmur at the aortic area.  ABDOMEN:  Benign, nontender, nondistended.  EXTREMITIES:  With +1 edema bilaterally.   IMPRESSION:  1. Refractory hypertension:  Her blood pressure seems to be better      controlled than before.   Although it is slightly high today, her      blood pressure readings have been actually much better.  The      patient thinks she has white coat syndrome.  I agree with a giving      her furosemide twice daily, especially with her chronic kidney      disease.  This might help her blood pressure control.  Her current      blood pressure medications include furosemide 40 mg twice daily,      metoprolol 50 mg twice daily, lisinopril 20 mg once daily,      hydralazine 50 mg t.i.d., amlodipine 10 mg daily, and clonidine 0.1      mg as needed.  These medications will be continued.  2. History of atrial flutter, status post ablation in March 2011.  We      will continue with amiodarone 200 mg once daily and Pradaxa 75 mg      twice daily for long-term anticoagulation.  She  seems to be in      sinus rhythm.  3. History of cardiomyopathy with an ejection fraction of 40%.  We      will continue with ACE inhibitor and beta-blocker.  4. Peripheral vascular disease:  She does have mesenteric ischemia      previously and she has a carotid bruit in the left side today.  We      will continue with the medical therapy for now.      She tells me that she had carotid Doppler done in the last 3 years.      However, this is not available to me.  We will consider repeating      another one on subsequently visit.  She will follow up with me in 4      months from now or earlier if needed.     Lorine Bears, MD  Electronically Signed    MA/MedQ  DD: 07/27/2009  DT: 07/28/2009  Job #: [id number]

## 2010-05-31 NOTE — Assessment & Plan Note (Signed)
[facility]                        [place] CARDIOLOGY OFFICE NOTE   Elizabeth Wall, Elizabeth Wall                     MRN:          [id number]  DATE:09/08/2008                            DOB:          [birth year]    CHIEF COMPLAINT:  Hypertension.   HISTORY OF PRESENT ILLNESS:  Elizabeth Wall is an 77 year old white female,  past medical history significant for malignant hypertension, bilateral  renal artery stenosis, status post bilateral renal artery stenting with  subsequent restenosis and reintervention, CVA, who is presenting for  cardiovascular management.  Patient states she was last seen by Dr.  Excell Wall in April of this year who has been following her for bilateral  renal artery stenosis.  She states that with her age it is becoming  increasingly more difficult to travel to [facility] for those visits.  She states since her last visit she was diagnosed with lichen planus  that the physicians at [facility] seemed to think was secondary to one of her  medications.  Elizabeth Wall, her primary care physician, has been  attempting to determine which medication this is.  Per the patient, the  most likely culprits were the Lasix and the clonidine and less likely  was the Toprol.  Nonetheless, the patient is not taking any of those  medications currently except for clonidine p.r.n. when her blood  pressure is very elevated.  Patient states that at home she has been  getting along relatively well.  She has actually had an increase in her  energy since she has been taken off some of the above-mentioned  medications.  Her blood pressure does run high at home and that when she  checks it it is sometimes in the 190s systolic.  She denies any chest  discomfort or any change in her dyspnea on exertion which occurs only  after walking for a significant amount of time or up significant  inclines.  She has not experienced any significant headaches, weakness,  or numbness.  She is  also compliant with all of her medications.  Patient states that the rash that she that was diagnosed as lichen  planus by the [facility] has now resolved.  She does state that she  occasionally takes clonidine p.r.n. for elevated blood pressures and  that sometimes her lip peels a little bit after taking this medication.   PAST MEDICAL HISTORY:  As above in HPI.   SOCIAL HISTORY:  Does not smoke.  Does not drink any significant amount  of alcohol.  She lives alone at home but her son lives in town.   CURRENT MEDICATIONS:  1. Aggrenox 25/200 mg b.i.d.  2. Valturna 300/320 mg daily.  3. Diltiazem 360 mg daily.  4. Triamterene/hydrochlorothiazide 37.5/25 mg daily.  5. Viactiv two a day.  6. Prilosec 20 mg daily.  7. Levothyroxine 75 mcg daily.   PHYSICAL EXAMINATION:  Blood pressure in the left arm is 196/80.  Blood  pressure in the right arm is 197/78.  Her pulse is 79.  She is  saturating 96% on room air.  GENERAL:  She is in no acute distress.  HEENT:  Nonfocal.  NECK:  She has a left-sided carotid bruit.  There is no carotid bruit on  the right side.  HEART:  Regular rate and rhythm without murmur, rub, or gallop.  LUNGS:  Clear bilaterally.  ABDOMEN:  Soft, nontender, and nondistended.  EXTREMITIES:  There is 1+ bilateral lower extremity edema.  SKIN:  Warm and dry.  PSYCHIATRIC:  Patient is appropriate with normal levels of insight.   EKG independently reviewed by myself demonstrates normal sinus rhythm  with a rate of 73 beats per minute.  There were no significant ST or T-  wave abnormalities.  Review of the results of the patient's renal duplex  from April of this year shows less than 60% bilateral renal artery  stenosis.  She also had known severe stenosis in the celiac access and  superior mesenteric artery.  She has moderate stenosis in the distal  abdominal aorta.  Review of the patient's record also indicates that in  2007 she had carotid Dopplers performed  which showed a 50% to 59% right  internal carotid artery stenosis and a 0% to 39% left internal carotid  artery stenosis.   ASSESSMENT AND PLAN:  1. Hypertension.  Patient has a history of malignant hypertension that      did not appear to significantly improve after stenting of her      bilateral renal arteries.  She continues to have an elevated blood      pressure despite the fact that she is on multiple      antihypertensives.  Today in clinic we will add on Toprol XL 25 mg      daily.  This was one of the medications that was less likely to      have been a cause of the skin lesions that she experienced.  Should      she notice any return of these skin lesions, she should stop taking      this medication and contact our office.  She should continue her      other antihypertensives as listed above.  We would also like to      check a creatinine, renin, and aldosterone level today.  At her      next visit, she will have a repeat renal duplex.  2. Lower extremity edema, currently has an unclear etiology.  She has      a known ejection fraction that is within normal limits and likely      has some diastolic dysfunction which may be the cause of this      edema.  Because Lasix may have been one of the more likely causes      of the patient's skin lesions, we will hold off on diuresis with      this medication.  She should continue on the hydrochlorothiazide      for now.  At her next office visit with Elizabeth Wall, patient states      she will confirm with him that he was most concerned about the      Lasix use.  3. Renal artery stenosis.  Patient had stable findings at her last      ultrasound.  It is not entirely clear whether the patient actually      had a decrease in her hypertension as a result of the stenting.  We      will check a creatinine today and follow up with repeat renal      duplex in  several months' time.  4. Carotid artery stenosis.  Patient has a history of carotid  artery      stenosis, however, it was less than 59%.  She also carries a      history of stroke.  We recommend that she continue on her Aggrenox      and in addition to the Aggrenox that she supplement aspirin therapy      with 81 mg of aspirin daily.  Should she experience any further      neurologic symptoms, we would repeat the carotid Dopplers.  Patient      will follow up in approximately 3 months' time.    Brayton El, MD  Electronically Signed   SGA/MedQ  DD: 09/08/2008  DT: 09/08/2008  Job #: [id number]

## 2010-05-31 NOTE — Progress Notes (Signed)
HEALTHCARE                        PERIPHERAL VASCULAR OFFICE NOTE   KENNICE, FINNIE                     MRN:          [id number]  DATE:10/23/2006                            DOB:          1919-02-20    Elizabeth Wall returns for followup at the [facility] Peripheral Vascular  office on October 23, 2006.  She is a delightful 77 year old woman who  has renal artery stenosis and longstanding severe hypertension.  She has  chronic kidney disease.  She has been a patient of Dr. Melinda Crutch, and I  will be assuming her care from here forward.  She is followed by Dr.  Eliott Nine in nephrology and Dr. Debby Bud as her primary care physician.  Ms.  Wall has had recurrent problems with in-stent restenosis and has been  treated with cutting balloon angioplasty.  She underwent a renal  ultrasound today that demonstrated moderate bilateral renal artery  stenosis with peak systolic velocities of 341 cm per second in the right  renal origin and 338 cm per second in the left renal artery origin.  The  right-sided velocities are stable, and the left-sided velocities have  increased since her last evaluation.   She reports no change in her blood pressure.  She continues to have  frequent systolic blood pressures on average of 150 mmHg.  However, it  is not uncommon for her to have blood pressures in the 170s.  She has  been compliant with her medications and takes Clonidine when her blood  pressure has been greater than 160.  However, she has been more  reluctant to do this, as her blood pressure drops precipitously with  even low dose Clonidine, and she feels poorly when her systolic blood  pressure is down in the range of 120.  She has no chest pain, dyspnea,  or other complaints.   CURRENT MEDICATIONS:  1. Digoxin 0.125 mg daily.  2. Toprol XL 100 mg daily.  3. Klor-Con 20 mEq twice daily.  4. Levothyroxine 75 mcg daily.  5. Prilosec 20 mg daily.  6. Multivitamin  daily.  7. Furosemide 80 mg daily.  8. Aggrenox twice daily.  9. Amlodipine/benazepril 5/20 mg twice daily.   PHYSICAL EXAM:  She is alert and oriented, elderly woman, in no acute  distress.  Weight is 137 pounds, blood pressure 178/60 in the right arm and 184/66  in the left arm, heart rate 80, respiratory rate 16.  HEENT:  Normal.  NECK:  Normal carotid upstrokes.  Jugular venous pressure is normal.  LUNGS:  Clear to auscultation bilaterally.  HEART:  Regular rate and rhythm with no murmurs or gallops.  ABDOMEN:  Soft, nontender, no organomegaly, no abdominal bruits.  EXTREMITIES:  No cyanosis, clubbing, or edema.  Peripheral pulses are 2+  and equal throughout.   ASSESSMENT:  1. Bilateral renal artery stenosis.  2. Chronic kidney disease, stage III-IV.  3. History of stroke.  4. Dyslipidemia.  5. Osteoarthritis.   PLAN:  I would favor continued medical therapy for the patient's  hypertension as directed by Dr. Debby Bud and Dr. Eliott Nine.  She does have  moderate bilateral  renal artery stenosis due to in-stent restenosis.  However, Elizabeth Wall has stable renal function and essentially unchanged  blood pressure control.  In discussing this with her, she really never  had a good response to renal stenting, and her blood pressure did not  change even after her initial procedure.  In that setting, I am less  inclined to be aggressive unless she has deterioration of renal function  or velocities that suggest severe in-stent restenosis.  I would like to  see her back in 6 months with a followup renal duplex scan at that time.     Veverly Fells. Excell Seltzer, MD  Electronically Signed    MDC/MedQ  DD: 10/23/2006  DT: 10/23/2006  Job #: [id number]   cc:   Rosalyn Gess. Norins, MD  [facility] Eliott Nine, M.D.

## 2010-05-31 NOTE — Assessment & Plan Note (Signed)
[facility]                        [place] CARDIOLOGY OFFICE NOTE   Elizabeth Wall, Elizabeth Wall                     MRN:          [id number]  DATE:01/05/2009                            DOB:          [birth year]    PROBLEM LIST:  1. Malignant hypertension.  2. History of bilateral renal artery stenosis, status post bilateral      renal artery stenting.  Last renal duplex in November 2010, showing      less than 60% stenosis bilaterally.  3. History of cerebrovascular accident.   INTERVAL HISTORY:  The patient states that her blood pressures have been  under better control since the last alteration in her medical regimen.  The systolic has been as low as 140s.  She states that she has not had  any chest discomfort or headache, but occasionally feels lightheaded.  The patient also states that she struck her right arm in an  entertainment center several weeks ago and it has been hurting since  then.  The patient states she has had an x-ray, and she was told that  there was no fracture.  She is compliant with her medications, and she  takes clonidine occasionally in the evening if her blood pressures are  high although she states she has not checked her blood pressure much in  the past week.   PHYSICAL EXAMINATION:  VITAL SIGNS:  Today, her blood pressure is 165/70  in the right arm and 191/76 in the left arm, through 50 minutes in  seated position, her blood pressure is 176/70, her pulse is 79, her  weight is 127 pounds which is 3 pounds less than she weighed at the end  of November.  She is saturating in 98% on room air.  GENERAL:  She is in no acute distress.  HEENT:  Normocephalic, atraumatic.  NECK:  Supple.  HEART:  Regular rate and rhythm.  LUNGS:  Clear bilaterally.  ABDOMEN:  Soft, nontender, nondistended.  EXTREMITIES:  Without edema.   In September of this year, aldosterone was 5.7, her renin activity was  0.35.  Review of the patient's  recent BMP dated December 21, sodium is  137, potassium 5.0, chloride 101, CO2 29, BUN 47, creatinine 1.1,  glucose 93.   ASSESSMENT/PLAN:  1. Hypertension.  The patient is currently taking      losartan/hydrochlorothiazide 100/25 mg daily, Tekturna 300 mg      daily, diltiazem 360 daily, Toprol-XL 25 mg daily.  The patient      also is taking Lasix 80 mg every other day.  She will switch the      Lasix to 40 mg daily.  Because her renin is already very      suppressed, we will replace the patient's Toprol-XL with      hydralazine.  We will start her on the low dose of 10 mg 3 times a      day and titrate up as needed.  Spacing out, this medication may      also be helpful and that her blood pressures are significantly      higher in the  evenings compared to the mornings.  2. Arm pain.  The patient is encouraged to use the Tylenol p.r.n.  It      appears that the pain is localized to her right humerus and does      extend somewhat to the right shoulder.  She was previously told      that she has arthritis in that shoulder.  If the pain does not      resolve within several weeks' time, she will see her      rheumatologist.  3. Increased BUN-creatinine ratio.  Her BUN is at 47, at last check,      it was in the 30s.  This may be secondary to prerenal azotemia      from diastolic dysfunction.  We will recheck it along with a CBC at      her next visit.     Brayton El, MD  Electronically Signed    SGA/MedQ  DD: 01/05/2009  DT: 01/06/2009  Job #: [id number]

## 2010-05-31 NOTE — Assessment & Plan Note (Signed)
[facility]                        [place] CARDIOLOGY OFFICE NOTE   MANAMI, TUTOR                     MRN:          [id number]  DATE:10/16/2008                            DOB:          1919-05-23    INTERVAL HISTORY:  Ms. Pieratt is an 77 year old white female with a  history of malignant hypertension, bilateral renal artery stenosis  status post bilateral renal artery stenting with subsequent restenosis  or re-intervention, CVA was presenting with increased lower extremity  edema.  The patient states she chronically has lower extremity edema,  right greater than left.  However the right lower extremity has been  increasing more over the past week.  She is now having some erythema  around the sock line and down.  This is new.  She has had multiple  medication intolerances in the past, but is currently taking furosemide  80 mg daily as her diuretic.  She denies any fevers, chills, or  chest pain.  She also has no history of blood clots.   MEDICATIONS:  She is currently taking include  1. Aspirin 325 daily.  2. Lasix 40 mg daily.  3. Valturna 300/320 mg.  4. Diltiazem 360 mg daily.  5. Back to Prilosec and levothyroxine 75 mcg daily.   PHYSICAL EXAMINATION:  VITAL SIGNS:  Temperature is 97.1.  Blood  pressure 187/67 and pulse 77.  She weighs 127 pounds, saturating 97% on  room air.  GENERAL:  No acute distress.  HEART:  Regular rate and rhythm without murmur, rub or gallop.  LUNGS:  Clear bilaterally.  ABDOMEN:  Soft, nontender, and nondistended.  EXTREMITIES:  She has 2-3+ right lower extremity edema and a 1+ left  lower extremity edema.  SKIN:  Warm and dry.  Her right lower extremity has erythema consistent  with venostasis   ASSESSMENT AND PLAN:  The patient appears to have worsening of the  bilateral lower extremity, right greater than left with accompanying  venostasis on the right lower extremity.  Of note the patients left  lower extremity edema is usually worse.  The patient is afebrile, is not  complaining of fevers or chills at home and the area of erythema is not  warm.  For now, we will have the patient increase her Lasix from 80 mg  daily to 80 mg daily and 40 mg in the afternoon.  If over the weekend,  the swelling does not improve or if it worsens she has to increase her  Lasix to 80 mg in the afternoon.  She will contact to our office on  Monday if the issue is not improving.  At that time, we will consider  venous ultrasound of the lower extremities.  Again her  hypertension is not under good control and she has changed her  hypertensive medications  since her last visit.  We will get the records from Dr. Debby Bud office  and to see, which medications, she has truly been intolerant to in the  past.     Brayton El, MD  Electronically Signed    SGA/MedQ  DD: 10/16/2008  DT: 10/17/2008  Job #: [id number]

## 2010-05-31 NOTE — Assessment & Plan Note (Signed)
[facility]                               LIPID CLINIC NOTE   TITA, TERHAAR                     MRN:          [id number]  DATE:11/22/2006                            DOB:          Apr 08, 1919    Ms. Mcghee comes in today for follow up of her hyperlipidemia therapy,  which includes Tricor 48 mg daily.  She has been compliant.  She was  recently started on this, and was given samples.  She has been compliant  with it, and has been tolerating it just fine with no muscle aches or  pains to report.   Other medications include digoxin, Toprol XL, potassium, Synthroid,  Prilosec, multivitamins, furosemide, Aggrenox, amlodipine with  benazepril, clonidine.   PHYSICAL EXAMINATION:  Weight of 134 pounds.  Blood pressure is 162/68.  Heart rate is 74.   LABORATORY DATA:  Total cholesterol 176, triglycerides 366, HDL 18.7,  LDL 93.  Liver function tests were within normal limits.   ASSESSMENT:  Ms. Mcdonnell triglycerides are much improved from 1142 down  to 366.  Obviously, not at goal of less than 150 yet, but improved with  the initiation of Tricor plus her diet modifications.  Diet  modifications include almost a complete cutting out of sweets and candy.  She has also greatly reduced saturated fats and fried foods.  She has  also tried to decrease the amount of carbohydrates that she takes in.  Another measure she has taken is to eat the largest meal of the day at  lunchtime, and have a much smaller dinner.   PLAN:  We are going to continue Tricor 48 mg daily.  I gave her a  prescription for 90-day supply.  I am hoping that there will be  continued improvement of the triglycerides.  Once her triglycerides get  down below 200 or so, we will turn our attention towards reducing the  LDL to less than 70, and raising the HDL to greater than 45.  She has  not been able to exercise much.  [place] walk on in the  neighborhood, and her driveway has dirt with  lots of loose gravel.  She also has ankle osteoarthritis, which limits  her exercise tolerance.  I congratulated her on the dietary modification  that she has been able to make, and encouraged her to continue.  We will  follow up in 3 months with repeat lipid and liver panel, and  consider our options at that time.  She was asked to call us with any  concerns or questions in the meantime.      Charolotte Eke, PharmD  Electronically Signed      Rollene Rotunda, MD, [facility]  Electronically Signed   TP/MedQ  DD: 11/22/2006  DT: 11/22/2006  Job #: [id number]

## 2010-05-31 NOTE — Assessment & Plan Note (Signed)
[facility]                               LIPID CLINIC NOTE   Elizabeth Wall, Elizabeth Wall                     MRN:          [id number]  DATE:10/25/2006                            DOB:          October 15, 1919    [name] is a very pleasant 77 year old patient seen un the lipid  clinic today with Elizabeth Wall, Pharm D resident.  [name] has been  referred to the lipid clinic for her elevated triglycerides.   PAST MEDICAL HISTORY:  Pertinent for:  1. Hypertension.  2. Bilateral renal artery stenosis.  3. Hyperlipidemia.  4. Osteoarthritis.  5. Hypothyroidism.  6. She is status post stroke in 2007.  7. She has chronic anxiety.   She has had past surgeries including:  1. Hysterectomy.  2. Tonsillectomy.  3. Stent placement.   CURRENT MEDICATIONS:  1. Aggrenox 25/200, one capsule b.i.d.  2. Prilosec 20 mg daily.  3. Metoprolol ER 100 mg in the morning and 50 mg in the evening.  4. Amlodipine/benazepril 5/20 mg, one twice daily.  5. Digoxin 0.125 mg daily.  6. Klor-Con 20 mEq twice daily.  7. Clonidine 0.1 mg every 4 hours as needed for blood pressure greater      than 160 systolic.  8. Lasix 80 mg p.o. daily.  9. Tylenol Arthritis 1-2 tablets every 8 hours as needed.  10.Levothyroxine 75 mcg daily.   DRUG INTOLERANCES:  1. SULFA.  2. AUGMENTIN which yields diarrhea.  3. NIACIN which caused flushing with IR and diarrhea with NIASPAN.   SOCIAL HISTORY:  She is a nonsmoker and does not drink alcohol.   DIET:  Includes cereal usually for breakfast, 1% milk, coffee without  cream.  Lunches include vegetables, beans, peas, greens,  fruits and  salads.  Lunches do not often include meats but may include some chicken  or [place] on a rare basis.  Suppers are more fried or heavy meals  including fried oysters, fish, slaw, hush puppies, baked potatoes, dark  chocolate, shrimp, not lots of breads, pastas, or rice but more  potatoes.  She does not  participate in intentional exercise though she  does do some yard work periodically.   PHYSICAL EXAMINATION:  VITAL SIGNS:  Blood pressure today is 170/70,  heart rate is 80, respirations are 17.   LABORATORY:  Obtained on October 04, 2006, at [facility] reveal triglycerides 1142, HDL 12, total cholesterol 132, LDL  is undetectable.  LFTs last obtained May 2008, revealed normal LFTs.   ASSESSMENT:  The patient has significant hypertriglyceridemia and does  have Class III-IV chronic renal disease.  Based on this, we have to use  caution in initiating fenofibrate as initial therapy though it is an  important therapy for the patient.  She does have a history of renal  artery stenosis and is under evaluation for potential computed  tomographic angiogram in the future.   PLAN:  1. Based on the patient's renal function, we will begin TriCor 48 mg      one tablet daily.  We have provided samples to the patient  to      facilitate compliance.  She will follow up in 1 month.  2. She has been given handouts on reduction of triglycerides through      dietary selection including sweets reduction and starch reduction.  3. She will work on walking approximately 1 mile around a [place]      near her house or 3 times around her driveway, working up to 4      times a week.  4. She will follow up with [place] in 1 month and will call with questions      in the meantime.   Thank you for the opportunity to see this pleasant patient.      Shelby Dubin, PharmD, BCPS, CPP  Electronically Signed      Rollene Rotunda, MD, [facility]  Electronically Signed   MP/MedQ  DD: 10/26/2006  DT: 10/26/2006  Job #: [id number]   cc:   Veverly Fells. Excell Seltzer, MD  Rosalyn Gess. Norins, MD  [facility] Eliott Nine, M.D.

## 2010-05-31 NOTE — Assessment & Plan Note (Signed)
[facility]                               LIPID CLINIC NOTE   RHANDI, DESPAIN                     MRN:          [id number]  DATE:05/30/2007                            DOB:          06-08-1919    [name] is seen back in the lipid clinic for further evaluation and  medication titration associated with her hyperlipidemia and elevated  triglycerides.  Upon dietary review, the patient has been working to  decrease her overall intake of fat-laden foods.  She has had an episode  of gout since her last visit with [place], and she expresses some confusion  over what she is or is not supposed to be eating as it relates to her  diet therapy.  She has been compliant with her current lipid-lowering  medication, which includes Crestor 5 mg daily and TriCor 48 mg daily.  She has had no muscle aches, pains, weakness, or fatigue.  She has had  some lessening of her appetite.  She does not cook for herself at home  on a regular basis, and it seems from what she is telling me that she  skips some meals instead of cooking.   PAST MEDICAL HISTORY:  1. Bilateral renal artery stenosis.  2. Chronic kidney disease, stage III-IV.  3. History of stroke.  4. Dyslipidemia.  5. Osteoarthritis.   CURRENT MEDICATIONS:  1. Digoxin 0.125 mg daily.  2. Klor-Con 20 mEq twice daily.  3. Levothyroxine 75 mcg daily.  4. Prilosec 20 mg daily.  5. Multivitamin daily.  6. Aggrenox b.i.d.  7. Toprol XL 100 mg in the morning, 50 mg in the evening.  8. TriCor 48 mg daily.  9. Crestor 5 mg daily at bedtime.  10.Colchicine 0.6 mg daily.  11.Amlodipine with benazepril 10/20, 1 tablet daily.  12.Furosemide is back at 20 mg daily.  13.Glucosamine/chondroitin.  14.Seven, the [employer] product, she takes 1 tablet daily.  15.Vitamin C daily.  16.Lutein daily.   ALLERGIES:  1. SULFA.  2. AUGMENTIN.   CURRENT LABORATORIES:  On May 27, 2007, reveal normal LFTs.  Total  cholesterol 114,  triglycerides 222, HDL 15, LDL directly measured at  13.0.   LIMITED PHYSICAL EXAMINATION:  VITAL SIGNS:  Weight today in the office  is 134 pounds, heart rate is 84, respirations are 16.  EXTREMITIES:  [name] previous episode of gout on the right outer  aspect of her foot has resolved, with no outward manifestations at this  time.   ASSESSMENT:  The patient has significant dyslipidemia and has improved  her combination therapy at this time, meeting her primary goal for lipid-  lowering therapy with an LDL of less than 70.  [name]  triglycerides are still elevated at 222.  However, I am reticent to  increase the patient's TriCor at this time based on her small body size  and combination therapy.  Instead, we spent some time talking about  other ways to reduce triglycerides including reducing dietary intake of  simple sugars and working to identify how these simple sugars are broken  down.  She  will call with questions or problems.  In the meantime, we  will follow up in 12 weeks.  We have also talked about preparing and  freezing foods in small portions so that she does not have to worry  about leftovers and states that she will consider doing this.  I have  offered that she call me with questions about this, and I will be glad  to share any recipes that I have found from our family.      Shelby Dubin, PharmD, BCPS, CPP  Electronically Signed      Rollene Rotunda, MD, [facility]  Electronically Signed   MP/MedQ  DD: 05/30/2007  DT: 05/30/2007  Job #: [id number]   cc:   Rosalyn Gess. Norins, MD  Veverly Fells. Excell Seltzer, MD  [facility] Salvia. Eliott Nine, M.D.

## 2010-05-31 NOTE — Progress Notes (Signed)
[facility]                        PERIPHERAL VASCULAR OFFICE NOTE   Elizabeth, Wall                     MRN:          [id number]  DATE:06/21/2006                            DOB:          1919/06/08    PRIMARY CARE PHYSICIAN:  Rosalyn Gess. Norins, M.D.   NEPHROLOGIST:  Duke Salvia. Eliott Nine, M.D.   HISTORY OF PRESENT ILLNESS:  Elizabeth Wall is an 77 year old woman with  atherosclerotic renal artery stenosis.  Elizabeth Wall initially underwent stenting  of both renal arteries in June of 2005.  In August of 2007, I performed  cutting balloon angioplasty of the right renal artery and balloon  angioplasty of the left renal artery for in-stent restenosis.  Throughout all this, her blood pressures have been quite labile.  Initially after opening them, they may have been slightly less elevated.  However, Dr. Eliott Nine and I were both not convinced that there was a  significant improvement.   On duplex earlier this spring, we noticed some restenosis.  Renal  function has remained stable.  Restenosis does not appear to be severe.  I repeated studies today which are stable compared with prior.  Renal  size is also stable.   CURRENT MEDICATIONS:  1. Digoxin 0.125 mg daily.  2. Toprol-XL 150 mg daily.  3. Klor-Con 20 mEq twice daily.  4. Levothyroxine 75 mcg daily.  5. Prilosec 20 mg daily.  6. Multivitamin.  7. Lasix 80 mg daily.  8. Aggrenox b.i.d.  9. Clonidine 0.2 mg p.r.n. hypertension.  10.Lotrel  5/20 one b.i.d.  11.Lipitor 20 mg daily.   PHYSICAL EXAMINATION:  GENERAL:  Elizabeth Wall is generally well-appearing in no  distress.  VITAL SIGNS:  Heart rate 75, blood pressure 170/60 on the left and a  186/54 on the right.  Weight is 142 pounds which is up 3 pounds from a  month ago.  NECK:  Elizabeth Wall has no jugular venous distension, thyromegaly, or  lymphadenopathy.  Carotid pulses are 2+ bilaterally without bruit.  LUNGS:  Respiratory effort is normal.  Clear to  auscultation.  CARDIAC:  Elizabeth Wall has a nondisplaced point of maximal cardiac impulse.  There is a regular rate and rhythm without murmur, rub, or gallop.  ABDOMEN:  Soft, nondistended, nontender.  There is no  hepatosplenomegaly.  Bowel sounds are normal.  There is no abdominal  bruit.  EXTREMITIES:  Warm without clubbing, cyanosis, edema, or ulceration.   Renal duplex today shows stable kidney sizes at 10.8 x 4.8 on the right  and 9.3 x 5.4 on the left.  The right renal artery origin has increased  peak systolic velocity at 380 cm per second but a renal aortic ratio of  only 2.3.  On the left, the peak velocity is also at the origin at 245  cm per second but a renal aortic ratio of only 1.44.   LABORATORY DATA:  Laboratory studies dated May 31, 2006 show a  creatinine of 1.3.   IMPRESSION/RECOMMENDATIONS:  Stable moderate in-stent restenosis of both  renal arteries, right greater than left.  Renal function is stable.  Per  discussion with both the patient  and her son who is a physician, we will  have a goal of conservative management as long as her renal function  remains stable and renal artery stenosis does not appear critical.  Will  plan on repeating duplex in 4 months' time and having her follow up with  Dr. Excell Seltzer at that time.  Dr. Eliott Nine is managing the hypertension.     Salvadore Farber, MD  Electronically Signed    WED/MedQ  DD: 06/21/2006  DT: 06/21/2006  Job #: [id number]   cc:   Rosalyn Gess. Norins, MD  [facility] Eliott Nine, M.D.

## 2010-05-31 NOTE — Assessment & Plan Note (Signed)
[facility] CARDIOLOGY OFFICE NOTE   Elizabeth Wall, Elizabeth Wall                     MRN:          [id number]  DATE:12/21/2009                            DOB:          1919-05-04    Elizabeth Wall is an 77 year old female who is here today for a followup  visit.  She has the following problem list:  1. History of refractory hypertension with chronic kidney disease and      previous renal artery stenosis.  2. History of bilateral renal artery stenosis status post bilateral      stenting in 2006.  3. Atrial flutter status post ablation in March of 2011.  4. History of cerebrovascular accident.  5. History of cardiomyopathy with most recent ejection fraction of      40%.  6. 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   INTERVAL HISTORY:  During her last visit, the patient continued to  complain of dyspepsia and abdominal discomfort.  Due to that, I stopped  Pradaxa and switched her to warfarin.  Since then, she actually had  significant improvement in her GI symptoms.  She feels better overall.  Her blood pressure also has been reasonably controlled at home.  She  denies any palpitations, chest pain, or dyspnea.   PHYSICAL EXAMINATION:  VITAL SIGNS:  Weight is 116.6 pounds, blood  pressure is 148/63, pulse is 59, oxygen saturation is 98% on room air.  NECK:  Reveals no JVD or carotid bruits.  LUNGS:  Clear to auscultation.  HEART:  Regular rate and rhythm with no gallops.  There is 1/6 systolic  ejection murmur at the aortic area.  ABDOMEN:  Benign, nontender, nondistended.  EXTREMITIES:  With no clubbing, cyanosis, or edema.   IMPRESSION:  1. Dyspepsia and abdominal discomfort:  This has improved      significantly after stopping Pradaxa.  She is currently on warfarin      instead.  Obviously, not all her symptoms have resolved as expected      which is likely due to some other GI  etiologies.  It is also      possible that she is having true mesenteric insufficiency leading      to symptoms, but overall her symptoms have improved significantly      and thus will continue with medical therapy.  2. Atrial flutter status post ablation:  We will continue long-term      anticoagulation with warfarin.  She is currently on 2 mg at      bedtime.  We will check her PT/INR today.  We will enroll her in      our Anticoagulation Clinic in [place].  3. Hypertension:  Her blood pressure has been reasonably controlled on      metoprolol 50 mg      twice a day, lisinopril 20 mg once a day, hydralazine 50 mg 3 times      a day, as well as Norvasc 10 mg once daily.   She will follow up with me in 3 months from now or  earlier if needed.     Lorine Bears, MD  Electronically Signed    MA/MedQ  DD: 12/21/2009  DT: 12/22/2009  Job #: [id number]

## 2010-05-31 NOTE — Assessment & Plan Note (Signed)
[facility]                        [place] CARDIOLOGY OFFICE NOTE   Elizabeth Wall, Elizabeth Wall                     MRN:          [id number]  DATE:05/06/2009                            DOB:          [birth year]    PROBLEMS LIST:  1. Hypertension.  2. Bilateral renal artery stenosis status post bilateral renal artery      stenting.  3. Atrial flutter, now status post atrial flutter ablation.  4. Cerebrovascular accident.   INTERVAL HISTORY:  The patient is doing well since her last visit.  She  denies any chest discomfort or palpitations.  She states that her lower  extremity edema is a little bit worse today.  She has been compliant  with her medications and home health nurses have been coming by on a  weekly basis.  She brings in a blood pressure log with her today that  indicates her blood pressures have been elevated averaging around  180/90.   PHYSICAL EXAMINATION:  VITAL SIGNS:  Today, her blood pressure is  187/72, pulse is 70, she is satting 97% on room air, she weighs 123  pounds.  GENERAL:  She is in no acute distress.  HEENT:  Nonfocal.  HEART:  Regular rate and rhythm without murmur, rub, or gallop.  LUNGS:  Clear bilaterally.  ABDOMEN:  Soft, nontender, nondistended.  EXTREMITIES:  1+ bilateral lower extremity edema.  SKIN:  Warm and dry.   Review of the patient's labs dated April 12; her sodium is 138,  potassium 4.0, chloride 100, CO2 of 28, BUN 33, creatinine 1.3, glucose  111.   ASSESSMENT AND PLAN:  1. Hypertension.  Blood pressure is elevated.  She should continue on      metoprolol 50 mg b.i.d.  We will increase her lisinopril today from      5 to 20 mg daily.  We will have the home health nurse check a BMP      next week and also phone [place] with her blood pressures.  At that      time, we will most certainly have to further titrate up her      antihypertensives.  A reasonable next step would be to increase      lisinopril  further to 40 mg daily and possibly to institute therapy      with HCTZ.  2. Atrial flutter.  The patient is on amiodarone and Pradaxa and      tolerating these well.  She is not symptomatic from any of this at      this time.  3. Systolic dysfunction.  The patient is on an ACE inhibitor and a      beta-blocker.  She is slightly volume overloaded today although her      weight is down compared to where it was several      months ago.  She should continue on the Lasix 40 mg b.i.d., and she      may take an additional 20 mg every morning as needed.     Brayton El, MD  Electronically Signed    SGA/MedQ  DD: 05/06/2009  DT: 05/07/2009  Job #: [id number]   cc:   [facility] Salvia. Eliott Nine, M.D.  Rosalyn Gess Norins, MD

## 2010-05-31 NOTE — Assessment & Plan Note (Signed)
OFFICE VISIT   Elizabeth Wall, Elizabeth Wall  DOB:  January 19, 1919                                       01/24/2010  BMWUX#:[id number]   REASON FOR VISIT:  Follow-up of abdominal pain.   HISTORY:  This is a very pleasant 77 year old female that I have been  following for approximately a year with abdominal pain.  She initially  presented in February 2011 with a near syncopal episode.  At that time,  she was tachycardic in the 140s.  This was treated with beta blockers  which caused her to have a hypotensive episode.  With this she developed  severe abdominal pain.  CT scan confirmed mesenteric stenosis, however,  as we  had her blood pressure increase her symptoms on the leg.  I have  been following her since that time managing her conservatively with  taking Beano which does help.  She does, however, appear to be having  significant fear of food and has lost some weight since last February.  Because of her symptoms and weight loss, we are here to discuss possible  intervention.  She is not having any chest pain or shortness of breath.  Her atrial fibrillation has been managed with Pradaxa, however, this has  recently been switched to Coumadin.  Dr. Jarold Motto has also started her  on __________  to see if this makes any improvement.   REVIEW OF SYSTEMS:  Review of systems are negative except for what is  detailed above.   PAST MEDICAL HISTORY:  Atrial flutter, hypertension, history stroke,  mitral regurgitation, tricuspid regurgitation, reflux disease, hiatal  hernia.   SOCIAL HISTORY:  She lives by herself and is widowed.   ALLERGIES:  Sulfa, Augmentin, clonidine, IVP dye and aspirin.   PHYSICAL EXAMINATION:  Heart rate is 54, blood pressure 184/60 O2 sat  98%.  GENERAL:  She is well-appearing, in no distress.  HEENT:  Within normal limits.  RESPIRATIONS:  Nonlabored.  CARDIOVASCULAR:  She has palpable femoral pulses.  ABDOMEN:  Scaphoid and soft, nontender.  EXTREMITIES:  Warm and well perfused.  Neurologically she is intact.   ASSESSMENT/PLAN:  Mesenteric stenosis.   PLAN:  After further evaluating the patient's clinical course over the  past year, I really feel like she is declining.  She is losing weight.  She has an obvious fear of food.  I do not think she would fully  appreciates the significance of her symptoms.  For that reason, she  tries to minimize how she is really doing with her appetite and weight  gain.  I think at this point the best course of action is to proceed  with arteriogram and better evaluation of her mesenteric vessels.  If  indeed I find that she has a significant stenosis which could explain  her symptoms, we would proceed with stenting at that time.  I did  discuss the potential risks with the patient as well as the benefits.  The risks would include access site bleeding, distal embolization from  stent placement, recurrent in-stent stenosis and possibility of needing  operative revision.  She understands all these and we are set to proceed  on the 24th of January.  She is going to stop her Coumadin 5 days prior.  In addition, she suffers from renal insufficiency, so we are going to  have the bicarb protocol initiated  the morning of the procedure.  Based  on the angle of her SMA and celiac, I think the best approach is to come  from the left arm.     V. Durene Cal IV, MD  Electronically Signed   VWB/MEDQ  D:  01/24/2010  T:  01/25/2010  Job:  [id number]   cc:   Dr. Ronnald Nian, MD

## 2010-05-31 NOTE — Procedures (Signed)
MESENTERIC ARTERIAL DUPLEX EVALUATION   INDICATION:  Followup SMA stenosis.   HISTORY:  Diabetes:  No.  Cardiac:  Atrial flutter.  Hypertension:  Yes.  Smoking:  No.   Mesenteric Duplex Findings:  Aorta - Proximal                            Pre 76, at 233 cm/s  Aorta - Mid                                 134  Aorta - Distal                              NV   Celiac Trunk - Proximal                     160  Celiac Trunk - Distal                       NV   Hepatic Artery                              145  Splenic Artery                              117   Superior Mesenteric Artery-Origin           409  Superior Mesenteric Artery-Proximal         335  Superior Mesenteric Artery-Mid              159  Superior Mesenteric Artery-Distal   Inferior Mesenteric Artery-Proximal         NV   IMPRESSION:  1. Technically difficult study secondary to bowel gas, calcification.  2. Proximal aorta appears to have significant calcific lesion with      velocities of 233 cm/s and poststenotic turbulence.  3. Continued >60% superior mesenteric artery ostial stenosis.   ___________________________________________  V. Charlena Cross, MD   LT/MEDQ  D:  01/24/2010  T:  01/24/2010  Job:  [id number]

## 2010-05-31 NOTE — Assessment & Plan Note (Signed)
[facility]                        [place] CARDIOLOGY OFFICE NOTE   Elizabeth Wall, Elizabeth Wall                     MRN:          [id number]  DATE:09/09/2009                            DOB:          1919-02-11    PROBLEM LIST:  1. History of refractory hypertension with chronic kidney disease.  2. History of bilateral renal artery stenosis status post bilateral      renal artery stenting in 2006.  3. Atrial flutter status post ablation in March of 2011.  4. History of cerebrovascular accident.  5. History of cardiomyopathy with most recent ejection fraction of      40%.  6. 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   INTERVAL HISTORY:  Elizabeth Wall is an 77 year old female who is here today  for a followup visit.  She is here to discuss potential side effects of  some of her medications.  Overall, her blood pressure seems to be much  better controlled than before.  I reviewed her home blood pressure  readings and most of the time, her systolic pressure is remaining below  140-150.  She is concerned about increased abdominal discomfort  associated with nausea and heartburn.  She also describes significant  epigastric pain after every meal.  She does have history of mesenteric  artery stenosis which is being managed medically due to her age and  chronic kidney disease.  She also informed that she has history of  peptic ulcer disease.  Among her medications, Pradaxa was started in  March, hydralazine was started later in June or July.  She has been also  on amiodarone since March.   Medications include:  1. Levothyroxine 75 mcg once daily.  2. Prilosec 20 mg once daily.  3. Multivitamin twice daily.  4. Lasix 40 mg twice daily.  5. Probiotic over-the-counter.  6. Hydroxyzine 200 mg once daily.  7. Amiodarone 200 mg once daily.  8. Aspirin 81 mg daily.  9. Pradaxa 75 mg twice daily.  10.Metoprolol 50 mg  twice daily.  11.Lisinopril 20 mg daily.  12.Hydralazine 50 mg 3 times daily.  13.Norvasc 10 mg daily.  14.Clonidine only as needed, but she has not required this medication.   PHYSICAL EXAMINATION:  GENERAL:  The patient is frail looking patient  who is in no acute distress.  VITAL SIGNS:  Weight is 117 pounds, blood pressure is 150/68, pulse is  65.  HEENT:  Normocephalic, atraumatic.  NECK:  No JVD.  There is a faint left carotid bruit on the left side.  RESPIRATORY:  Normal respiratory effort with no use of accessory  muscles.  Auscultation reveals normal breath sounds.  CARDIOVASCULAR:  Normal PMI.  Regular rate and rhythm with occasional premature beats.  Normal S1 and S2 with no gallops.  There is a 1/6 systolic ejection  murmur at the aortic area.  ABDOMEN:  Benign, nontender, nondistended.  EXTREMITIES:  +1 edema bilaterally.  SKIN:  Scattered bruising, especially in the upper extremities.  PSYCHIATRIC:  She is alert, oriented x3 with normal mood and affect.  MUSCULOSKELETAL:  Normal muscle strength in the upper and lower  extremity.   IMPRESSION:  1. Refractory hypertension:  Her blood pressure seems to be well      controlled at this time compared to previously.  Her home blood      pressure readings are actually close to optimal range.  She always      has higher readings here in the office.  We will continue with the      current medications.  2. Epigastric discomfort with symptoms suggestive of abdominal angina      and other suggestive of peptic ulcer disease versus      gastroesophageal reflux disease.  Some of the most recent      medication that were started which might cause GI symptoms include      hydralazine.  But she is also on multiple other medications that      can cause that such as amiodarone and Pradaxa.  She is definitely      having a component of postprandial abdominal pain which seems to be      more related to stenosis in her mesenteric artery.   At this time, I      recommend GI evaluation.  I asked her to follow up with Dr.      Jarold Motto, her gastroenterologist.  An EGD might be warranted.  In      the meantime, I asked her to start taking Prilosec 20 mg twice      daily.  3. Atrial flutter status post ablation:  She is currently on Pradaxa.      She seems to have frequent bruising with this and she is asking if      there is an alternative for this medication.  I explained to her      that this would be the best medication in terms of stroke      prophylaxis.  However, given that she had atrial flutter ablation      and had no recurrence of arrhythmia, we might consider switching      this to something else in the future.  The protection from future      cerebrovascular accident is not as strong as with Pradaxa, but      nonetheless, she will be partially protected if we put her on a      combination of aspirin and Plavix.  4. Peripheral vascular disease with stenosis of the superior      mesenteric artery:  Currently, she is having symptoms suggestive of      abdominal angina related to this.  She is having discomfort after      every meal.  Revascularization might be indicated.  However,      obviously it will carry an extra risk with it given her age and      chronic kidney disease.  She would like to discuss this further      with her son, with an interventional radiologist before deciding.      She has been seen by Vascular Surgery before.  The patient will      return for followup in 3 months from now or earlier if needed.     Lorine Bears, MD  Electronically Signed    MA/MedQ  DD: 09/09/2009  DT: 09/09/2009  Job #: [id number]

## 2010-05-31 NOTE — Assessment & Plan Note (Signed)
[facility]                        [place] CARDIOLOGY OFFICE NOTE   Elizabeth Wall, Elizabeth Wall                     MRN:          [id number]  DATE:03/08/2009                            DOB:          [birth year]    ADMISSION NOTE:   CHIEF COMPLAINT:  Tachycardia and edema.   HISTORY OF PRESENT ILLNESS:  Elizabeth Wall is an 77 year old white female  with past medical history significant for hypertension, history of CVA,  history of bilateral renal artery stenosis status post bilateral renal  artery stenting who is presenting for evaluation of palpitations and  tachycardia at home.  The patient rigorously checks her blood pressure  and heart rate at home and recently noticed that occasionally her heart  rate would get up to the 130s.  We placed a 24-hour event monitor on her  which revealed an irregular narrow complex tachycardia with some  organized atrial activity.  This rhythm was likely atrial  flutter/fibrillation with rapid ventricular response.  There were also  frequent PVCs.  The patient denies any chest discomfort at home although  she occasionally feels palpitations.   PAST MEDICAL HISTORY:  As above.  Of note, her last renal artery duplex  was in November 2010 showing less than 60% stenosis bilaterally.   SOCIAL HISTORY:  No tobacco, no alcohol.  She lives at home but her son  lives in town.   ALLERGIES:  SULFA AND AUGMENTIN.   CURRENT MEDICATIONS:  1. Diltiazem 360 mg daily.  2. Aspirin 325 daily.  3. Levothyroxine 75 mcg daily.  4. Prilosec 20 mg daily.  5. Lasix 40 mg p.r.n. which she has not been taking regularly.  6. Toprol XL 25 mg daily which she has been taking intermittently.   REVIEW OF SYSTEMS:  Positive for increased lower extremity edema and  about 8 pounds in fluid weight gain over the past month.   PHYSICAL EXAM:  Blood pressure is 155/93 in the right arm, 153/89 in the  left arm.  Her heart rate is 118.  She is satting 98%  on room air.  She  weighs 129 pounds which is 8 pounds more than she weighed on January 20.  GENERALLY:  No acute distress.  HEENT:  Normocephalic, atraumatic.  NECK:  Supple.  There is no JVD in the seated position.  HEART:  Tachycardic and irregular.  LUNGS:  Clear bilaterally.  ABDOMEN:  Soft, nontender, nondistended.  EXTREMITIES:  Have 1-2+ bilateral lower extremity edema.  SKIN:  Warm and dry.  PSYCHIATRIC:  The patient is appropriate.   EKG taken in the clinic and independently interpreted by myself  demonstrates a narrow complex tachycardia with organized atrial activity  that likely represents atrial flutter, although this could also be an  atrial tachycardia.  There is also a PVC.  Review of the patient's  Holter monitor:  The average heart rate was 100, max was 127, minimum  was 77.  The majority of the rhythm appears to be atrial  fibrillation/atrial flutter.  There are also multiple PVCs.   REVIEW OF LABORATORY WORK:  Last lab work on file from February  1:  Sodium 138, potassium 4.5, chloride 106, CO2 of 22, BUN 51, creatinine  1.7, glucose 114.  Her creatinine on December 21 was 1.1.  Of note, her  Lasix was decreased after the increase in creatinine to 1.7.   ASSESSMENT:  An 77 year old white female with hypertension and a history  of cerebrovascular accident who now appears to be having an atrial  arrhythmia which is likely atrial flutter but could also represent an  atrial tachycardia.  She is also volume overloaded.   PLAN:  1. Arrhythmia.  The patient will be admitted to [facility]      for evaluation and treatment of her arrhythmia.  We will have      electrophysiology see the patient as an inpatient to help determine      appropriate medical treatment.  She is already on Cardizem at home      and the patient states that with the addition of Toprol XL she      occasionally had systolic blood pressures down to 110 and she      believes she may have  been symptomatic from hypotension at that      time.  A careful titration of the medications will be needed.  She      will also likely require anticoagulation should her hemoglobin be      within normal limits.  She would be a good candidate for Pradaxa 75      mg twice a day.  2. Volume overload.  The patient will need to have an echocardiogram      checked to determine left ventricular systolic function.  I will      initiate diuresis with Lasix 40 mg IV daily and this can be      adjusted based on her creatinine at admission.  3. Hypertension.  The patient has a history of poorly controlled      malignant hypertension resistant to multiple medications.  However,      recent titration of her medications now places her simply on      diltiazem and Toprol and her blood pressures have been well-      controlled on this regimen, although she tends to self-titrate      these medications at home based on her own blood pressure readings.     Brayton El, MD  Electronically Signed    SGA/MedQ  DD: 03/08/2009  DT: 03/08/2009  Job #: [id number]

## 2010-05-31 NOTE — Assessment & Plan Note (Signed)
OFFICE VISIT   Elizabeth Wall, Elizabeth Wall  DOB:  [birth year]                                       11/29/2009  ZOXWR#:[id number]   The patient comes back in today for followup of her abdominal pain.  She  is an 77 year old female who I met in February with severe abdominal  pain following a near syncopal episode.  She was found to be tachycardic  in the 140s and when her blood pressure improved her abdominal pain  disappeared.  She had a CT scan that showed mesenteric stenosis.  However, she became asymptomatic with a better blood pressure so I  elected not to intervene.  Since she has been at home she is able to  take Beano which alleviates her problems.  She has not had any weight  loss since I last saw her.  She still describes difficulty with eating.  When she eats at the end of her meal she has to either belch or pass  flatus.  If she is able to do this she does not have abdominal pain, if  she is not she will have pain until she can release some of her gas.  The Beano does help with her symptoms.   PHYSICAL EXAMINATION:  Vital signs:  Heart rate 64, blood pressure  175/76, O2 sat 97% on room air.  Temperature is 97.9.  General:  She is  well-appearing, in no distress.  Lungs:  Clear bilaterally.  Cardiovascular:  Regular rhythm.  No murmur.  Abdomen:  Soft, nontender  with good bowel sounds.   ASSESSMENT:  Abdominal pain.   PLAN:  Although the patient does have documented mesenteric stenosis I  do not feel like her symptoms are consistent with her vascular disease.  If she were having true mesenteric ischemic symptoms her pain would not  be relieved with belching, it would be persistent and she would be  losing weight.  Since none of these are the case I am reluctant to  recommend intervention for her mesenteric stenosis.  In addition, we do  not have an accurate measurement of her mesenteric stenosis due to the  calcific changes on CT scan.  Ultrasound  suggests greater than 60%  stenosis.  If the patient's symptoms change we would consider  arteriogram and possible intervention, however, I will plan on seeing  her back in 1 year to evaluate her again with an ultrasound.     Jorge Ny, MD  Electronically Signed   VWB/MEDQ  D:  11/29/2009  T:  11/30/2009  Job:  [id number]   cc:   Dr Johney Frame  Dr Jarold Motto

## 2010-05-31 NOTE — Procedures (Signed)
MESENTERIC ARTERIAL DUPLEX EVALUATION   INDICATION:  Follow up known superior mesenteric artery stenosis.   HISTORY:  Diabetes:  No.  Cardiac:  No.  Hypertension:  Yes.  Smoking:  No.   Mesenteric Duplex Findings:  Aorta - Proximal                            157  Aorta - Mid                                 153  Aorta - Distal                              108   Celiac Trunk - Proximal                     312  Celiac Trunk - Distal                       214   Hepatic Artery                              Not visualized  Splenic Artery                              Not visualized   Superior Mesenteric Artery-Origin           472  Superior Mesenteric Artery-Proximal         224  Superior Mesenteric Artery-Mid              182  Superior Mesenteric Artery-Distal   Inferior Mesenteric Artery-Proximal         Not visualized    IMPRESSION:  There appears to be >60% stenosis in the superior  mesenteric artery and celiac artery noted.   ___________________________________________  V. Charlena Cross, MD   CB/MEDQ  D:  04/12/2009  T:  04/13/2009  Job:  [id number]

## 2010-05-31 NOTE — Assessment & Plan Note (Signed)
[facility]                         GASTROENTEROLOGY OFFICE NOTE   BRIZZA, NATHANSON                     MRN:          [id number]  DATE:08/07/2006                            DOB:          1919/05/24    Elizabeth Wall is an 77 year old white female with renal artery stenosis who  had recent angioplasty of her right renal artery by Dr. Samule Ohm.  She has  mild chronic renal failure related to such.  She is also followed  regularly by Dr. Debby Bud for her multiple medical problems which have  included hyperlipidemia, peripheral vascular disease, and hypertension.  She additionally has hypothyroidism, chronic GERD, osteoarthritis,  peripheral vascular disease.   She comes to my office today complaining of nausea which seems directly  related to niacin therapy per Dr. Debby Bud because of hyperlipidemia.  Since her niacin has been discontinued she really has no nausea, but  continues to complain of abdominal gas, bloating, and diarrhea.  She is  on digoxin 0.125 mg a day, Toprol XL 100 mg a day, potassium 20 mEq  twice a day, el thyroxin 0.75 mg a day, Prilosec 20 mg a day, Lasix 80  mg a day, Aggrenox twice daily, and amlodipine 5-20 twice a day.   In past she has had reactions to AUGMENTIN AND SULFA.   PHYSICAL EXAMINATION:  Today her weight is 140 pounds and blood pressure  is 182/84. Pulse 88 and regular.   Her abdomen was rather distended but was non tense, nontender, there  were no definite masses or organomegaly.  Bowel sounds were very active  in nature.  Rectal exam was deferred.   ASSESSMENT:  On review of this patient's chart I have a high suspicion  that she may have chronic bacterial overgrowth syndrome, especially with  the amount of abdominal distention and diarrhea that she has.   RECOMMENDATIONS:  1. Check B12 and folate levels.  2. Trial of Xifaxan 400 mg t.i.d. for 10 days.  3. Align probiotic therapy for 10 days.  4. GI follow up  p.r.n.   ADDENDUM:  This patient has had cholecystectomy, had ERCP with  sphincterotomy performed in June of 1992.  There was a retained common  duct stone noted at that time, also a periampullary duodenal  diverticulum which would of course contribute to any bacterial  overgrowth syndrome.  In addition this patient has had in the past  suspected gastroparesis and is on chronic PPI therapy.     Elizabeth Rea. Jarold Motto, MD, Caleen Essex, FAGA  Electronically Signed    DRP/MedQ  DD: 08/07/2006  DT: 08/08/2006  Job #: [id number]   cc:   Elizabeth Gess. Norins, MD  Elizabeth Wall, M.D.  Elizabeth Farber, MD

## 2010-05-31 NOTE — Assessment & Plan Note (Signed)
[facility]                        [place] CARDIOLOGY OFFICE NOTE   Elizabeth, Wall                     MRN:          [id number]  DATE:03/29/2009                            DOB:          [birth year]    PROBLEM LIST:  1. Hypertension.  2. Bilateral renal artery stenosis status post bilateral renal artery      stenting.  3. Atrial flutter now status post atrial flutter ablation.  4. CVA.   INTERVAL HISTORY:  At last office visit, the patient was admitted to  [facility].  Her treatment included an atrial flutter ablation,  preceded by a TEE which showed an ejection fraction of 40%.  In the  hospital stay, she also had acute on chronic mesenteric ischemia  secondary to superior mesenteric artery stenosis.  At that time, she was  having abdominal pain.  The patient states that since she has been home,  she has been getting along quite well.  Her edema has improved.  She has  not had any chest discomfort or significant dyspnea.  She also denies  any return of palpitations or abdominal pain.  She has home health nurse  and physical therapy assisting her at home.  She does endorse a dry  cough that is nonproductive but denies any fevers.  She believes it is  associated with postnasal.  The patient brings in a blood pressure log  showing morning systolic blood pressures around 160 systolic with the  blood pressure being more controlled in the evening.   PHYSICAL EXAMINATION:  VITAL SIGNS:  Blood pressure is 157/78, pulse 74,  satting 96% on room air.  She weighs 123 pounds, which is 6 pounds less  than she weighed on the March 08, 2009.  GENERAL:  No acute distress.  HEENT:  Normocephalic, atraumatic.  There is no JVD in the seated  position.  HEART:  Regular rate and rhythm without murmur, rub, or gallop.  LUNGS:  Clear bilaterally.  ABDOMEN:  Soft, nontender, nondistended.  EXTREMITIES:  Bilateral lower extremity edema 1+.  SKIN:   Cool and dry.  PSYCHIATRIC:  The patient is appropriate, has normal levels of insight.   Review of the patient's labs dated March 26, 2009, sodium 138, potassium  4.9, chloride 98, CO2 33, BUN 29, creatinine 1.12.  CBC showed a white  count of 9.9, hemoglobin 11.3, hematocrit 34.5, platelet count of 563.  Review of the patient's hospital admission is as above.   ASSESSMENT/PLAN:  1. Atrial flutter.  The patient is not having any symptomatic      arrhythmia.  She should continue on Pradaxa 75 mg twice a day.  She      should also continue on metoprolol 75 mg daily.  Her ejection      fraction was 40% during her transesophageal echocardiography as an      inpatient and this may have been secondary to tachycardia-induced      cardiomyopathy, which could potentially resolve with time.  2. Systolic dysfunction.  The patient is on beta-blocker and ACE      inhibitor.  She is steadily losing  weight, which is likely from      successful diuresis.  She should continue on the current Lasix dose      which is 40 b.i.d., and we will recheck a BMP in a week.  Of note,      her potassium was 4.8, so we will ask her to stop taking potassium      supplementation.  3. Hypertension.  [name] blood pressure is not at goal.  It      appears blood pressures are more elevated in the mornings than the      evenings.  We will ask her to take 50 mg of metoprolol in the      morning and take 25 mg in the evening.  If the potassium comes down      at next blood draw, we could consider increasing her lisinopril      dose.  I have also written the patient for bedside commode as she      is getting up several times at night to use the bathroom and she      has issues with deconditioning, arthritis, and immobility.     Brayton El, MD  Electronically Signed    SGA/MedQ  DD: 03/29/2009  DT: 03/30/2009  Job #: [id number]   cc:   Duke Salvia. Eliott Nine, M.D.

## 2010-05-31 NOTE — Assessment & Plan Note (Signed)
[facility]                        [place] CARDIOLOGY OFFICE NOTE   Elizabeth Wall, Elizabeth Wall                     MRN:          [id number]  DATE:02/04/2009                            DOB:          1919/04/27    PROBLEM LIST:  1. Malignant hypertension.  2. History of bilateral renal artery stenosis, status post bilateral      renal artery stenting.  Duplex in November 2010 showing less than      60% stenosis bilaterally.  3. History of cerebrovascular accident.   INTERVAL HISTORY:  The patient states she has been doing fairly well  since her last visit.  She brings in blood pressure list with her today  showing average blood pressures in the 160 systolic range.  She states  she has hardly taken any clonidine since her last visit as she had been  using it occasionally p.r.n.  [name] blood pressure log as above.   REVIEW OF SYSTEMS:  Positive for decreased appetite and weight loss  secondary to abdominal discomfort sometimes after eating solid foods,  but not with liquids.   PHYSICAL EXAMINATION:  VITAL SIGNS:  Today, the patient's blood pressure  was 160/76 in the right arm and left arm 184/74, pulse 95, sating 97% on  room air.  She weighs 121 pounds.  GENERAL:  No acute distress.  HEENT:  Normocephalic, atraumatic.  HEART:  Regular rate and rhythm.  LUNGS:  Clear.  ABDOMEN:  Soft bilaterally.  EXTREMITIES:  Without edema.  SKIN:  Warm and dry.   ASSESSMENT AND PLAN:  1. Hypertension.  [name] blood pressure is improved.  Today, we      will increase her hydralazine from 10 mg t.i.d. to 20 mg t.i.d.      Other medications as indicated in the chart, should be continued.  2. Increased BUN and creatinine ratio on BMP dated at January 05, 2009.  We will recheck blood chemistries.  3. Abdominal pain after eating.  The patient recently saw her primary      care physician for this issue and was given a course of antibiotics  for presumed possible diverticulosis.  The patient states that this      discomfort is worse at night and it has been relieved by Mylanta      and eating yogurt.  It is possible this is reflux, gastric erosion,      or perhaps even an ulcer.  The      patient may try taking Prilosec twice a day and supposed to once a      day to see if this helps.      If it is not, she is instructed that she should follow up once      again with her primary care physician regarding further evaluation.      We will see the patient back in 3 months' time.     Brayton El, MD  Electronically Signed    SGA/MedQ  DD: 02/04/2009  DT: 02/05/2009  Job #: [id number]

## 2010-05-31 NOTE — Assessment & Plan Note (Signed)
[facility]                        [place] CARDIOLOGY OFFICE NOTE   Elizabeth Wall, Elizabeth Wall                     MRN:          [id number]  DATE:12/15/2008                            DOB:          29-Jun-1919    PROBLEM LIST:  1. Malignant hypertension.  2. Bilateral renal artery stenosis status post bilateral renal artery      stenting with last renal duplex in November 2010 showing less than      60% bilateral stenoses.  3. History of CVA.   INTERVAL HISTORY:  The patient states since her last visit, her blood  pressure has remained elevated.  She saw Dr. Debby Bud in the office last  week at which time he stressed her the importance of taking her blood  pressure medicines at the same time every day.  The patient denies any  chest discomfort or headache, although she states she does feel little  lightheaded when her blood pressures are elevated.  She has been taking  clonidine p.r.n. in addition to her scheduled medicines for elevations  in blood pressure oftentimes greater than the 180 or 190 systolic.  Current medications she is taking for her blood pressure include Toprol  XL 25 mg daily, Valturna 300-320 mg daily (of note, the patient states  her new insurance will not cover this on the formulary and is requesting  different medication), diltiazem 360 mg daily.  The patient is also  taking Lasix 80 mg daily.   PHYSICAL EXAMINATION:  VITAL SIGNS:  Blood pressure is 205/77, pulse 77,  weight is 131 pounds, saturating 97% on room air.  GENERAL:  No acute distress.  HEENT:  Nonfocal.  HEART:  Regular rate and rhythm.  LUNGS:  Clear bilaterally.  ABDOMEN:  Soft, nontender, nondistended.  EXTREMITIES:  Bilateral lower extremity 1+ edema.   On review of the patient's renal duplex as above in HPI also should be  noted the patient had an aldosterone level of 5.7 and a renin level of  0.35, creatinine was 1.2 in the beginning of November.   ASSESSMENT  AND PLAN:  [name] blood pressure remains very  difficult to control.  Per renal duplex, she does not have recurrence of  renal artery stenosis.  Her Gemma Payor will need to be change secondary to  formulary issues.  We will have her stop taking the Valturna and in its  place take losartan/HCTZ 100/25 mg daily and Tekturna 300 mg daily.  She  should continue on the diltiazem 360 mg daily and the Toprol XL 25 mg  daily.  The new medication here is the hydrochlorothiazide 25 mg daily.  She should continue to take the Lasix p.r.n. for lower extremity edema.  We will see her back in 3 weeks' time, at which time we will likely need  to continue  titration of her blood pressure medications.  As her renin levels are  ready suppressed,  hydralazine would likely be the next agent to be considered.  Of note,  the patient was given clonidine 0.1 mg p.o. here in clinic as she was  mildly symptomatic from her high blood  pressure.     Brayton El, MD  Electronically Signed    SGA/MedQ  DD: 12/15/2008  DT: 12/16/2008  Job #: [id number]

## 2010-05-31 NOTE — Progress Notes (Signed)
[facility]                        PERIPHERAL VASCULAR OFFICE NOTE   DENIS, KOPPEL                     MRN:          [id number]  DATE:05/02/2007                            DOB:          17-Feb-1919    Elizabeth Wall returns for followup at the [facility] Peripheral Vascular  office on May 02, 2007.  She is a delightful 77 year old woman with  renal artery stenosis and longstanding severe hypertension.  She has  stage IV chronic kidney disease, and is followed by Dr. Eliott Nine.  Ms.  Elizabeth Wall underwent bilateral renal stenting in 2005.  She had restenosis,  and has undergone balloon angioplasty back in 2007.  She has never had  much of a blood pressure response to her renal interventions.  Elizabeth Wall  has had labile blood pressure for several years, and feels very poorly  when her systolic blood pressure goes below 130.  At home, her systolic  readings have been averaging in the 150s, but she occasionally has  systolic blood pressure readings above 190.   She has had no chest pain, dyspnea, or edema, orthopnea, or PND.  She  had a recent flare of gout involving the right foot.  She has no other  complaints today.   CURRENT MEDICATIONS INCLUDE:  1. Lasix 40 mg daily.  2. Digoxin 0.125 mg daily.  3. Klor-Con 20 mEq twice daily.  4. Synthroid 75 mcg daily.  5. Prilosec 20 mg daily.  6. Aggrenox twice daily.  7. Toprol XL 100 mg in the morning and 50 mg in the evening.  8. Tricor 48 mg daily.  9. Crestor 5 mg at bedtime.  10.Colchicine 0.6 mg daily.  11.Amlodipine/benazepril 10/20 mg daily.   ALLERGIES:  Include SULFA and AUGMENTIN.   PHYSICAL EXAMINATION:  The patient is an alert and oriented elderly  woman no acute distress.  Weight is 136.  Blood pressure 164/60, heart rate 80, respiratory rate  is 16.  HEENT:  Normal.  NECK:  Jugular venous pressure is normal.  LUNGS:  Clear bilaterally.  HEART:  Regular rate and rhythm without murmurs or  gallops.  ABDOMEN:  Soft, nontender, no organomegaly.  No abdominal bruits.  EXTREMITIES:  No clubbing, cyanosis or edema.  Femoral and pedal pulses  are both 2+ and equal bilaterally.   Renal duplex from April 1 showed peak systolic velocities of 3 meters  per second in both renal arteries with renal to aortic ratios of  approximately 2 on both sides.   ASSESSMENT:  1. Bilateral renal artery stenosis.  2. Chronic kidney disease, stage III to IV.  3. History of stroke.  4. Dyslipidemia.   DISCUSSION:  Elizabeth Wall remains stable with regard to her renovascular  disease.  Her renal duplex results are stable, and she has moderate  bilateral renal artery stenosis.  I would favor continued observation  and medical therapy.  Repeat renal duplex in 1 year.  Continue secondary  risk reduction measures.  She has been followed in the lipid clinic, and  is on dual therapy was Tricor and Crestor at present.   For followup I will  plan on seeing Elizabeth Wall  back in 1 year at the time  of her renal duplex.     Veverly Fells. Excell Seltzer, MD  Electronically Signed    MDC/MedQ  DD: 05/02/2007  DT: 05/02/2007  Job #: [id number]   cc:   Rosalyn Gess. Norins, MD  [facility] Salvia Eliott Nine, M.D.

## 2010-05-31 NOTE — Assessment & Plan Note (Signed)
[facility]                               LIPID CLINIC NOTE   CHARIE, PINKUS                     MRN:          [id number]  DATE:02/21/2007                            DOB:          01/20/1919    [name] returns to lipid clinic today and is seen with Luan Pulling, PharmD Resident.   PAST MEDICAL HISTORY:  Hypertension, bilateral renal artery stenosis,  hyperlipidemia, osteoarthritis, hypothyroidism, status post stroke 2007,  chronic anxiety.   PAST SURGICAL HISTORY:  Hysterectomy, tonsillectomy, in-stent placement.   CURRENT MEDICATIONS INCLUDE:  1. Digoxin 0.125 mg daily.  2. Potassium 20 mEq twice daily.  3. Levothyroxine 75 mcg daily.  4. Prilosec 20 mg daily.  5. Multivitamin daily.  6. Lasix 80 mg by mouth daily.  7. Aggrenox twice daily.  8. Lotrel 10/20 mg twice daily.  9. Toprol XL 100 mg once daily.  10.Clonidine 0.1 mg every 4 hours p.r.n.  11.Tricor 48 mg daily.  12.Tylenol as needed.  13.Gas-X as needed.   DRUG INTOLERANCE:  1. SULFA.  2. AUGMENTIN, yielding diarrhea.  3. NIACIN, causing flushing with the immediate release and diarrhea      with the Niaspan.   SOCIAL HISTORY:  Patient denies smoking and drinking.   PHYSICAL EXAM:  Patient weight 137 pounds, blood pressure 170/50, heart  rate of 84.   LABORATORY WORK:  Direct LDL of 94, total cholesterol 177, triglycerides  292, cholesterol HDL 21 and LDL, as stated earlier, 94.2.  LFTs within  normal limits.   ASSESSMENT:  Elizabeth Wall returns to lipid clinic with improved  triglycerides, previously of over 1000, now under 300, still above goal,  though, of 150.  LDL is near goal at 94.  We would still like to  continue pushing her towards below 70.  Total cholesterol is acceptable  at 177 and HDL is below normal at 21 and goal is above 50 for this  patient.   Patient recently took a several-week cruise to the [place], in which  she fell off from her  previous diet plan; however, did enjoy walking on  a daily basis.  Previous goals for diet included cutting out sweets and  candy.  She now has returned to [place], however, only minimally.  She works  hard at portion control and oftentimes takes half of her meals as left-  overs when she goes out to dinner.  She only minimally eats fried foods  or fast food and, when choosing her grains always opts for whole-wheat  options and minimally indulges in pasta, rice or potatoes.  She enjoys  daily salads and frozen vegetables and fresh fruit.  As far as exercise  is concerned, since returned from her cruise, she has stopped exercising  in light of the weather.  She, however, does have a stationary bike at  home; however, is not using it currently.  She also has been complaining  of some recent facial itching that is not accompanied by any sort of a  rash.  I suggested returning to her previous soap, as maybe that  might  be the culprit, and reducing make-up for the time-being.   PLAN:  Set a goal of using her stationary bike when cold outside for at  least 20 minutes two to three times a week.  Otherwise, walking outside  when possible.  Continue heart-healthy diet and continue to work on  limiting sweets and candies.  Increase vegetable intake to at least four  servings daily.  Continue on the Tricor at 48 mg daily.  Patient has  never tried a higher dose than this and, since triglycerides are above  goal, we will add another therapy, adding Crestor 5 mg by mouth at  bedtime.  We will follow up with this patient via phone in about a  month, make sure she is doing well, and we will see her again in three  months, on May 14.   Luan Pulling, PharmD Resident dictating under Leota Sauers, PharmD.      Leota Sauers, PharmD  Electronically Signed      Jesse Sans. Daleen Squibb, MD, [facility]  Electronically Signed   LC/MedQ  DD: 02/21/2007  DT: 02/22/2007  Job #: [id number]   cc:   Rollene Rotunda, MD, Gwynne Edinger. Excell Seltzer, MD  Rosalyn Gess. Norins, MD  [facility] Sal[facility] Eliott Nine, M.D.

## 2010-05-31 NOTE — Assessment & Plan Note (Signed)
[facility]                        [place] CARDIOLOGY OFFICE NOTE   Elizabeth Wall, Elizabeth Wall                     MRN:          [id number]  DATE:06/03/2009                            DOB:          12/18/1919    PROBLEM LIST:  1. Hypertension.  2. Bilateral renal artery stenosis status post bilateral renal artery      stenting.  3. Atrial flutter now status post a flutter ablation.  4. Cerebrovascular accident.   INTERVAL HISTORY:  The patient is doing relatively well.  The patient  states her blood pressures have remained elevated since her last visit.  She brings in a log today that shows blood pressures, the lowest blood  pressure 135, but most of blood pressures are between 140 and 200  systolic.  She has had an increase in lower extremity edema and her  nephrologist increased her Lasix to 80 mg b.i.d. in the past couple of  days.  This morning she endorses a mild headache.  She denies any  episodes of chest discomfort and has been compliant with her  medications.   PHYSICAL EXAMINATION:  VITAL SIGNS:  Her initial blood pressure was  219/82, pulse 64, saturating 96% on room air, and weighs 128 pounds  which is 5 pounds more than she weighed a month ago.  She was given 0.1  mg of clonidine after approximately 20 minutes, her blood pressure  decreased to 177/69.  GENERAL:  She is in no acute distress.  HEENT:  Normocephalic, atraumatic.  HEART:  Regular rate and rhythm without murmur, rub, or gallop.  LUNGS:  Clear bilaterally.  ABDOMEN:  Soft, nontender, nondistended.  EXTREMITIES:  1+ bilateral lower extremity edema.   Review of the patient's labs from yesterday, hemoglobin 10.6, hematocrit  32.8.  Her sodium is 139, potassium 4.2, chloride 99, CO2 of 25, BUN 35,  creatinine 1.44, glucose 115.  UA within normal limits.  Of note, her  creatinine on April 27, 2009 was 1.3.   ASSESSMENT AND PLAN:  1. Hypertension.  Blood pressure is not under  control.  Today in      clinic, we will continue lisinopril 20 mg daily and Lopressor 50 mg      b.i.d.  I will not increase lisinopril as her creatinine is      slightly higher than it was last month.  Also previous studies have      shown that her renin was suppressed, in the past, hydralazine has      been successful, so we will restart this at 10 mg t.i.d.  She will      contact the office Monday morning and let us know how her blood      pressures have been running and we can titrate up hydralazine at      that time.  The patient has clonidine at home which she will take      on a p.r.n. basis for elevated blood pressures over 190 systolic.      She is told to call 911, if she develops persistent headache, chest      pain, or  shortness of breath associated with the hypertension.  2. History of a flutter.  The patient is asymptomatic from a flutter.      Her rhythm is normal today.  She should continue on amiodarone and      Pradaxa.  3. Lower extremity edema.  This is likely somewhat a result of poorly-      controlled hypertension and a diastolic dysfunction.  She should      continue on the increased Lasix dose and should have a BMP checked      approximately at 1 week's time.     Brayton El, MD  Electronically Signed    SGA/MedQ  DD: 06/03/2009  DT: 06/04/2009  Job #: [id number]

## 2010-06-03 NOTE — Letter (Signed)
September 25, 2008    [name]  [address]  [place], [place] [address]   RE:  [name], [name]  MRN:  [id number]  /  DOB:  1919/01/23   Dear [name]:   I am writing to let you know that we received the results of your blood  work.  All of the tests were within normal limits, and your kidney  function is stable.  Please contact our office with any questions or  concerns, and we look forward to seeing you at your next appointment.    Sincerely,      Brayton El, MD  Electronically Signed    SGA/MedQ  DD: 09/25/2008  DT: 09/25/2008  Job #: [id number]

## 2010-06-03 NOTE — Op Note (Signed)
Elizabeth Wall, Elizabeth Wall              ACCOUNT NO.:  [id number]   MEDICAL RECORD NO.:  [id number]          PATIENT TYPE:  AMB   LOCATION:  SDS                          FACILITY:  [facility]   PHYSICIAN:  Salvadore Farber, MD  DATE OF BIRTH:  03-28-1919   DATE OF PROCEDURE:  09/08/2005  DATE OF DISCHARGE:                                 OPERATIVE REPORT   PROCEDURES:  1. Selective bilateral renal angiography.  2. Cutting balloon angioplasty of the right renal artery.  3. Balloon angioplasty of the left renal artery.  4. Angio-Seal closure of the right common femoral arteriotomy site.   INDICATION:  Ms. Gambrell is an 77 year old lady status post bilateral renal  artery stenting in June 2005.  Blood pressure has been labile but did not  appear to improve after the stenting procedure.  Over the past year, blood  pressures have remained labile but the means have been substantially higher.  Duplex ultrasonography in April demonstrated progression of restenosis on  the right with a peak systolic velocity of 401 cm/sec.  On the left there  was a peak systolic velocity of 264 cm/sec.  The right kidney remained 10.8  x 4.7 cm and the left 9.7 x 5.1 cm.  Due to her continue labile hypertension  and duplex evidence of restenosis, she presents for angiography and possible  repeat intervention.   PROCEDURAL TECHNIQUE:  Informed consent was obtained.  Under 1% lidocaine  local anesthesia, a 6-French sheath was placed in the right common femoral  artery using the modified Seldinger technique.  Selective bilateral renal  angiography was performed using a 5-French crossover catheter.  This  demonstrated bilateral renal artery stenosis with a translational gradient  of greater than 60 mmHg bilaterally.  The decision was therefore made to  intervene.   Anticoagulation was initiated with heparin to achieve and maintain an ACT of  greater than 250 seconds.  I up-sized the sheath to 7-French.  A 7-French  LIMA  guide was then advanced in position just outside the right renal  artery.  Using no-touch technique, a stabilizer wire was advanced through  the stented segment into the distal renal artery without difficulty.  I then  performed cutting balloon angioplasty of the in-stent restenosis using a 5.0  x 20-mm cutting balloon.  The excess length of the balloon was allowed to  hang back in the aorta.  It was inflated twice to 10 atmospheres.  Final  angiography demonstrated no residual stenosis, no dissection, and TIMI-3  flow to the distal vasculature.   Attention was then turned to the left renal artery.  The guide was engaged  in the left renal artery.  A stabilizer wire was advanced to the distal  renal artery without difficulty.  I was unable to advance the cutting  balloon into the stented segment due to the sharp downward angle.  I  therefore decided to use a Quantum.  I advanced a 5.0 x 12 mm Quantum  balloon into the stented segment.  I inflated it to 20 atmospheres at the  ostium, which appeared to be the most  severe portion of the stenosis.  I  then inflated it to 18 atmospheres in the remainder of the stent.  Final  angiography demonstrated no residual stenosis, no dissection, and TIMI-3  flow to the distal vasculature.   The arteriotomy was then closed using an Angio-Seal device.  Complete  hemostasis was obtained.  She was then transferred to the holding room in  stable condition.   COMPLICATIONS:  None.   IMPRESSION/RECOMMENDATIONS:  Successful repeat dilation of bilateral renal  arteries for in-stent restenosis, reducing stenosis to 0% bilaterally.   Will continue with her medications as they had been preprocedurally.  Will  check repeat renal duplex in the next week for two.  Will check BMET  next  week.      Salvadore Farber, MD  Electronically Signed     WED/MEDQ  D:  09/08/2005  T:  09/08/2005  Job:  [id number]   cc:   Rosalyn Gess. Norins, MD  [facility] Salvia Eliott Nine,  M.D.

## 2010-06-03 NOTE — Progress Notes (Signed)
[facility]                          PERIPHERAL VASCULAR OFFICE NOTE   Elizabeth Wall, Elizabeth Wall                     MRN:          [id number]  DATE:12/05/2005                            DOB:          Apr 05, 1919    PRIMARY CARE PHYSICIAN:  Illene Regulus.   NEPHROLOGIST:  Camille Bal.   HISTORY OF PRESENT ILLNESS:  Elizabeth Wall is an 77 year old woman with  atherosclerotic renal artery stenosis.  She initially underwent stenting of  both renal arteries in June 2005.  On August 24 of this year, we found her  to have restenosis bilaterally and proceeded to do a cutting balloon  angioplasty of the right renal artery and balloon angioplasty of the left  renal artery in-stent restenosis.  She tolerated this well.  Previously  labile blood pressures have come under better control with a combination of  this and changing timing of her Clonidine.   She is recovering nicely after her stroke.  She does have some mild residual  left hand weakness.  She denies any chest pain, exertional dyspnea, PND,  orthopnea, visual disturbance, and orthostatic dizziness.   CURRENT MEDICATIONS:  1. Digoxin 0.125 mg per day.  2. Toprol XL 150 mg per day.  3. K-Dur 20 mEq twice per day.  4. Levothyroxine 75 mcg per day.  5. Prilosec 20 mg per day.  6. Multivitamin.  7. Lasix 80 mg per day.  8. Aggrenox 1 b.i.d.  9. Clonidine 0.1 mg q.6h.  10.Lotrel 10/40 1 b.i.d.   PHYSICAL EXAMINATION:  She is generally well appearing, in no distress, with  heart rate 76, blood pressure 132/78 and equal bilaterally.  Weight is  stable at 138 pounds.  She has no Jugular venous distention, thyromegaly, or  lymphadenopathy.  LUNGS:  Clear to auscultation.  CARDIAC:  She has a nondisplaced point of maximal cardiac impulse.  There is  a regular rate and rhythm, without murmurs, rubs or gallops.  ABDOMEN:  Soft, nondistended, nontender.  There is no hepatosplenomegaly.  Bowel sounds are  normal.  EXTREMITIES:  Warm, without clubbing, cyanosis, edema, or ulceration.  Carotid pulses 2+ bilaterally without bruit.   Renal duplex performed November 06, 2005 demonstrates less than 60% in-stent  restenosis bilaterally  with a peak systolic velocity on the right of 229  cm/sec and on the left of 206 cm/sec.   IMPRESSION/RECOMMENDATIONS:  1. Bilateral renal artery stenosis: she status post repeat      revascularization using balloon angioplasty for restenosis bilaterally.      Will check renal function today and forward results to Dr. Eliott Nine.      Check repeat renal artery duplex in 6 months.  2. Hypertension:  Managed by Dr. Eliott Nine.  This seems to be going nicely.     Salvadore Farber, MD  Electronically Signed    WED/MedQ  DD: 12/05/2005  DT: 12/05/2005  Job #: [id number]   cc:   Rosalyn Gess. Norins, MD  [facility] Salvia Eliott Nine, M.D.

## 2010-06-03 NOTE — Progress Notes (Signed)
[facility]                        PERIPHERAL VASCULAR OFFICE NOTE   AMEIA, MORENCY                     MRN:          [id number]  DATE:05/21/2006                            DOB:          Apr 23, 1919    PRIMARY CARE PHYSICIAN:  Rosalyn Gess. Norins, M.D.   NEPHROLOGIST:  Duke Salvia. Eliott Nine, M.D.   HISTORY OF PRESENT ILLNESS:  [name] is an 77 year old woman with  atherosclerotic renal artery stenosis.  She initially underwent stenting  of both renal arteries in June of 2005.  In August of 2007, she  underwent cutting balloon angioplasty of the right renal artery and  plain balloon angioplasty of the left renal artery for end-stent  restenosis.  Throughout all of this, her blood pressure has been labile.  It was perhaps a bit less labile after opening the renals, but Dr.  Eliott Nine and I were both not thoroughly convinced that there was an  improvement.   She has recovered nicely after her stroke.  She does have some mild  residual left hand weakness.  She has not been having any chest  discomfort, exertional dyspnea, PND, orthopnea, visual abnormality, or  orthostatic dizziness.   CURRENT MEDICATIONS:  1. Digoxin 0.125 mg daily.  2. Toprol-XL 150 mg daily.  3. Klor-Con 20 mEq twice daily.  4. Levothyroxine 75 mcg daily.  5. Prilosec 20 mg daily.  6. Multivitamin.  7. Lasix 80 mg daily.  8. Aggrenox b.i.d.  9. Clonidine 0.2 mg p.r.n. hypertension  10.Lotrel 5/20 one b.i.d.  11.Lipitor 20 mg daily.   PHYSICAL EXAMINATION:  GENERAL:  She is generally well-appearing in no  distress.  VITAL SIGNS:  Heart rate 80, blood pressure 160/70 and equal  bilaterally.  Weight is 139 pounds which is stable.  NECK:  She has no jugular venous distension, thyromegaly, or  lymphadenopathy.  Carotid pulses are 2+ bilaterally without bruit.  LUNGS:  Clear to auscultation.  CARDIAC:  She has a nondisplaced point of maximal cardiac impulse.  There is a  regular rate and rhythm without murmur, rub, or gallop.  ABDOMEN:  Soft, nontender, nondistended.  There is no  hepatosplenomegaly.  Bowel sounds are normal.  EXTREMITIES:  Warm without clubbing, cyanosis, edema, or ulceration.   Renal artery duplex examination performed on April 03, 2006 demonstrates  systolic velocity of 315 cm per second on the right and 269 cm per  second on the left.  Renal aortic ratio was 2.5 on the right and 2.13 on  the left.  Kidney sizes are stable at 10.5 on the right and 9.5 on the  left.  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   IMPRESSION/RECOMMENDATIONS:  Renal artery stenosis.  Suggestion of at  least moderate end-stent restenosis bilaterally.  Will check renal  function today.  General goal is conservative management as long as  renal function is stable and renal artery stenosis does not appear  critical.  Will plan on repeating duplex at the beginning of June and  follow up with me thereafter.  Will leave hypertension management to Dr.  Eliott Nine.  Salvadore Farber, MD  Electronically Signed    WED/MedQ  DD: 05/21/2006  DT: 05/21/2006  Job #: [id number]

## 2010-06-03 NOTE — Letter (Signed)
June 28, 2009    Cora Daniels, DDS  Fax number is [phone]   RE:  RASHAUNA, TEP  MRN:  [id number]  /  DOB:  1919/04/26   Dear Dr. Donzetta Matters:   I understand that Ms. Smithers needs a crown on one of her molars.  She has  had persistent issues with hypertension which have been gradually  improving.  I am making further alterations to her medical regimen  today.   I think that it is safe for her to proceed with the crown placement.  Please contact my office with any questions or concerns.    Sincerely,      Brayton El, MD  Electronically Signed    SGA/MedQ  DD: 06/28/2009  DT: 06/28/2009  Job #: [id number]

## 2010-06-03 NOTE — H&P (Signed)
NAMELILLYN, Elizabeth Wall NO.:  [id number]   MEDICAL RECORD NO.:  [id number]          PATIENT TYPE:  EMS   LOCATION:  MAJO                         FACILITY:  [facility]   PHYSICIAN:  Gordy Savers, M.D. LHCDATE OF BIRTH:  September 16, 1919   DATE OF ADMISSION:  07/19/2005  DATE OF DISCHARGE:                                HISTORY & PHYSICAL   CHIEF COMPLAINT:  Weakness, numbness left hand.   PRESENT ILLNESS:  The patient is an 77 year old white female with a long  history of hypertension and renal artery stenosis.  She awoke from sleep at  approximately 4 or 5 a.m. this morning and noticed some numbness involving  her left hand.  She went back to sleep, but presented to the emergency room  later in the morning due to persistent numbness and now weakness involving  the left hand region.  She also has noted some slight numbness involving the  left periorbital area.  The patient has a history of renal artery stenosis  and multi-drug resistant hypertension.  She is now admitted for further  evaluation and treatment of a right brain stroke.   PAST MEDICAL HISTORY:  The patient has a long history of peripheral vascular  occlusive disease and renal artery stenosis.  In July of 2005 underwent  renal angiogram and bilateral stenting to the renal arteries.  She has been  followed closely by Dr. Samule Ohm and also by Dr. Eliott Nine to assist in blood  pressure control.  Prior operations have included a cholecystectomy in the  1990s, remote hysterectomy, and bilateral oophorectomy for ovarian cysts.  She has also had a bladder tack procedure.  Additional diagnoses include  hypothyroidism, mitral valve prolapse.   SOCIAL HISTORY:  She is a recent widow and lost her husband last month.  She  is accompanied by a daughter.   FAMILY HISTORY:  Father died of suicide death and also had a history of  hypertension.  Mother died at 14.  Two brothers positive for hypertension  and diabetes.  One  sister died a suicide death.   PHYSICAL EXAMINATION:  GENERAL:  Elderly white female who appeared her  stated age.  She is alert, oriented, and quite pleasant.  VITAL SIGNS:  Blood pressure was 170/76.  HEENT:  No signs of trauma.  Pupil responses were normal.  Oropharynx clear.  NECK:  Left carotid bruit and bilateral supraclavicular bruits.  CHEST:  Clear anterolaterally.  CARDIOVASCULAR:  Grade 2/6 systolic murmur.  BREASTS:  Negative.  ABDOMEN:  Soft and nontender.  Faint epigastric bruit noted as well as  bilateral femoral bruits.  There is no organomegaly or masses.  EXTREMITIES:  No edema.  Pedal pulses were decreased on the right.  NEUROLOGIC:  Patient alert, oriented with normal speech.  Cranial nerve  examination revealed no deficits.  Pupil responses were normal.  Extraocular  muscles were full.  There is no facial asymmetry.  Sensation was intact  involving the facial area.  Oropharynx, uvula, and tongue were midline.  She  had no difficulty with head turning or shoulder shrug.  EXTREMITIES:  Considerable weakness  involving her left grip strength.  Lower  extremities appeared normal.  Plantar responses were more withdrawal but no  pathologic reflexes could be demonstrated.  Extremities were also remarkable  for rheumatoid changes involving both hands.   IMPRESSION:  1.  Right brain stroke.  2.  Hypertension.  3.  History of renal artery stenosis.   DISPOSITION:  The patient will be admitted to a telemetry setting and  monitored.  The patient recently has had a carotid artery Doppler ultrasound  two days prior to admission that was reportedly normal.  2-D echocardiogram  will be reviewed.  The patient will be maintained on her multiple drugs for  blood pressure control and her blood pressure monitored carefully.  Stroke  team will be consulted.  She will also be started on DVT prophylaxis.           ______________________________  Gordy Savers, M.D.  [facility]     PFK/MEDQ  D:  07/19/2005  T:  07/19/2005  Job:  [id number]

## 2010-06-03 NOTE — Discharge Summary (Signed)
NAMEERAN, [name]              ACCOUNT NO.:  [id number]   MEDICAL RECORD NO.:  [id number]          PATIENT TYPE:  INP   LOCATION:  3001                         FACILITY:  [facility]   PHYSICIAN:  Willow Ora, MD           DATE OF BIRTH:  06/12/1919   DATE OF ADMISSION:  07/19/2005  DATE OF DISCHARGE:  07/22/2005                                 DISCHARGE SUMMARY   ADMISSION DIAGNOSES:  1. Right brain stroke.  2. Hypertension.  3. History of renal stenosis.   DISCHARGE DIAGNOSES:  1. Stroke confirmed.  2. Hypertension.  3. History of renal stenosis.   BRIEF HISTORY AND PHYSICAL:  Elizabeth Wall is an 77 year old female with a long  history of hypertension and renal stenosis who was admitted to the hospital  when she noticed numbness involving her left hand.  Later that morning she  had persistent numbness and then weakness in that area.  She also noticed  some numbness in the left periorbital area.  Upon admission, her blood  pressure was 170/76.  LUNGS:  Were clear to auscultation bilaterally.  CARDIOVASCULAR:  Regular rate and rhythm with a 2/6 systolic murmur.  ABDOMEN:  Was normal.  NEUROLOGICAL:  Exam showed that the patient was alert, oriented with a  normal speech.  Cranial nerve examination revealed no deficit.  Pupils had  normal responses.  There was no facial asymmetry.  She had considerable  weakness on the left grip.  Plantar response were more withdrawal but no  pathologic reflexes could be demonstrated.   LABORATORY AND X-RAYS:  MRI of the brain at the time of admission showed  basically focal area of cortical restricted diffusion compatible with a  subacute infarct involving the posterior right frontal lobe.  Also an MRA  showed severe stenosis of proximal left M1 segment with attenuating distal  MCA branch flow.  Some mild irregularities of the right A1 segment.  For  details, please see the complete report.  MRA of the neck showed no  significant carotid or vertebral  artery stenosis.  Initial white count was  16.4 with a hemoglobin of 14.2 and platelets of 405, sodium 138, potassium  4.8, glucose 111, creatinine 1.3, LFT's were basically normal, hemoglobin  A1c was 5.8, homocysteine level was elevated to 17.8, cholesterol was 159,  triglycerides 316, HDL 22, LDL 74.  Urinalysis was normal.  TSH was 0.18.   HOSPITAL COURSE:  The patient was admitted to the hospital.  The stroke team  was consulted as well as OT.  Her hospital stay was unremarkable.  She felt  well and her motor deficits were improving at the time of discharge.  She  was switched to Aggrenox and by the time of discharge, again, she was  feeling better.   DISCHARGE MEDICATIONS:  She is discharged with the following instructions:  1. Aggrenox 1 p.o. q. day for 1 week and then increase to 1 p.o. b.i.d.  2. Lotrel 5/20 1 p.o. b.i.d.  3. Potassium 20 1 p.o. b.i.d.  4. Prilosec daily.  5. Lasix.  6. Clonidine 0.2  twice daily.  7. Synthroid 0.075.  8. Digoxin 0.125.  9. Toprol-XL 100 daily.   FOLLOWUP:  She is to see Dr. Pearlean Brownie in 2 or 3 weeks as well as follow up with  cardiology and her primary care doctor within few weeks.  The plan is also  to get bubble study as an outpatient.  Finally, she did have a transthoracic  echo done on July 5th that showed normal left ventricular systolic function  with ejection fraction between 60-65.  She did have parameters consistent  with an abnormal left ventricular relaxation.  She had a large, floppy  atrial septal aneurysm noted and by echo we could not rule out a BFO.  The  estimated right ventricular systolic pressure was 40.  The right atrium was  moderately dilated.  They recommended a PEE with saline contrast for further  relation of the atrial septal aneurysm.  At the time of this dictation, I do  not have any report of PEE in the chart and I do not believe this was done.      Willow Ora, MD  Electronically Signed     JP/MEDQ  D:   09/21/2005  T:  09/22/2005  Job:  [id number]

## 2010-06-03 NOTE — Consult Note (Signed)
NAMEKRISNA, Elizabeth              ACCOUNT NO.:  [id number]   MEDICAL RECORD NO.:  [id number]          PATIENT TYPE:  INP   LOCATION:  3001                         FACILITY:  [facility]   PHYSICIAN:  Marlan Palau, M.D.  DATE OF BIRTH:  July 19, 1919   DATE OF CONSULTATION:  07/20/2005  DATE OF DISCHARGE:                                   CONSULTATION   HISTORY OF PRESENT ILLNESS:  Elizabeth Wall is an 77 year old left-handed  white female born 08/31/1919 with a history of hypertension,  rheumatoid arthritis, renal artery stenosis status post stent placements.  [facility] after she had noted some left hand  numbness and weakness that began on the evening of the 3rd July 2007.  Patient had fallen asleep in a chair after she had gotten up because of some  cramping around her right ankle.  Patient fell asleep in a chair.  When she  awakened noted that the left hand was numb and later noticed that it was  weak as well.  Patient also reported some numbness around the corner of the  mouth on the left face.  Patient, if anything, has had some improvement in  strength of the left hand, but still remains clumsy and somewhat numb.  Patient denies any problems with the left leg.  Denies any visual field  changes, headache.  Is able to ambulate.  Patient denies any previous  symptoms similar to this.  Neurology was asked to see the patient for  further evaluation.  MRI scan of the brain was done upon admission, shows  evidence of a small cortical infarct involving the right parietal area.  An  intracranial MRI angiogram was performed showing M1 stenosis that appears to  be severe on the left side.  The vessels on the right were normal.  Patient  did have a carotid Doppler study on the 2nd of July 2007 that was reportedly  normal.  2-D echocardiogram has been ordered, but is pending at this time.  Neurology is asked to see this patient for further evaluation.  Patient  is  on baby aspirin a day 81 mg prior to this admission.   PAST MEDICAL HISTORY:  1.  History of new onset of right parietal cortical infarct.  2.  Hypertension.  3.  Rheumatoid arthritis.  4.  Bilateral cataract surgery.  5.  Status post hysterectomy.  6.  Ovarian cyst resection bilaterally for benign cysts.  7.  Bladder resuspension procedure.  8.  Renal artery stenosis status post stent.  9.  History of bilateral wrist fractures.  10. History of hyperthyroidism.  11. History of depression.  12. Irritable bowel syndrome.  13. Hiatal hernia and gastroesophageal reflux disease.   Patient has allergy to SULFA DRUGS, is intolerant to ASPIRIN and AUGMENTIN.  Augmentin causes diarrhea.  Patient does not smoke or drink.   MEDICATIONS PRIOR TO ADMISSION:  1.  Potassium 20 mEq one a day.  2.  Amlodipine/benazepril 5/20  two daily.  3.  Clonidine 0.2 mg b.i.d.  4.  Prilosec 20 mg daily.  5.  Lasix 80 mg daily.  6.  Levothyroxine 0.075 mcg daily.  7.  Digoxin 0.125 mg daily.  8.  Toprol XL 100 mg daily.  9.  Aspirin 81 mg a day.  10. Doxycycline one b.i.d.  11. Lotrel as above.   SOCIAL HISTORY:  This patient lives in the [place], [place]  area, is widowed.  Has two sons who are alive.  One son is a radiologist  with mitral valve replacement on Coumadin.  Other son has renal calculi.  Daughter died following a suicide attempt.   FAMILY MEDICAL HISTORY:  Notable that mother died with heart disease age 57.  Father died with suicide, had a history of hypertension.  Patient has two  brothers who are alive, both with hypertension and one has diabetes as well.  Sister died postpartum depression after a suicide attempt.   REVIEW OF SYSTEMS:  Notable for no recent fevers, chills.  Patient denies  headache.  Denies any visual field changes.  Denies problems swallowing.  Denies neck pain.  Denies shortness of breath.  Does have occasional  palpitations.  HEART:  Denies chest  pain.  Denies abdominal discomfort,  nausea, vomiting, troubles controlling the bowels or bladder other than her  irritable bowel syndrome.  Patient denied any problems with gait.  Has had  no blackouts or seizures.   PHYSICAL EXAMINATION:  VITALS:  Blood pressure is 127/66, heart rate 65,  respiratory rate 20, temperature afebrile.  GENERAL:  This patient is a well-developed white female who is alert and  cooperative at the time of examination.  HEENT:  Head is atraumatic.  Eyes:  Pupils are equal, round, and reactive to  light.  Disks post surgical.  Disks are flat.  NECK:  Supple.  No carotid bruits noted.  RESPIRATORY:  Clear.  CARDIOVASCULAR:  Regular rate and rhythm.  No obvious murmurs or rubs noted.  EXTREMITIES:  Without significant edema.  NEUROLOGIC:  Cranial nerves as above.  Facial symmetry is present.  Patient  notes good sensation to the face to pin prick, soft touch bilaterally.  Has  good strength of the upper and lower extremities with exception of some  weakness with grip on the left hand and with abduction of the fingers on the  left hand.  Patient has slight weakness with elevation of the left leg as  compared to the right with 4+/5 strength in the left leg.  Pin prick, soft  touch, vibratory sensation are relatively intact with the exception of some  decrease in pin prick sensation only in the left hand, normal in the right.  Again, vibratory sensation is more symmetric.  Patient has fair finger-nose-  finger and heel to shin bilaterally.  No drift is noted.  Speech is normal  without aphasia or dysarthria.  Deep tendon reflexes are symmetric, slightly  depressed at the ankles.  Toes are neutral bilaterally.   LABORATORY VALUES ON ADMISSION:  Notable for a sodium 138, potassium 4.8,  chloride of 103, CO2 23, glucose of 111, BUN of 51, creatinine 1.3, calcium 9.1, total protein 6.7, albumin at 3.8, AST of 23, ALT of 27, alkaline  phosphatase of 97, total bilirubin  0.8.  Urinalysis reveals specific gravity  of 1.005, pH of 6, otherwise unremarkable.  White count of 16.4, hemoglobin  of 14.2, hematocrit of 41.2, MCV of 92, platelets of 405.   MRI scan is as above.   IMPRESSION:  1.  New onset of right parietal cerebrovascular infarction that is  cortical.  2.  Left M1 segment stenosis by MR angiogram.  3.  Hypertension.  4.  Renal artery stenosis status post stent and chronic renal insufficiency.   This patient has had minimal deficits at this point.  The NIH stroke scale  done today shows a total score of 1.  TPA was not delivered secondary to  minimal deficit and duration of symptoms.  Patient will require a bit  further work-up for her underlying stroke event.   PLAN:  1.  MR angiogram of the extracranial vessels.  2.  2-D echocardiogram is ordered and is pending.  3.  Consider transcranial Doppler study, rule out right to left heart shunt.      Given the cortical infarct, need to consider artery to artery or      cardiogenic embolus.  4.  Check cholesterol panel and homocysteine level.  5.  Occupational therapy evaluation.  Will consider switching patient to      Aggrenox.  Patient is on low-dose aspirin at this point.  Will follow      patient's clinical course while in-house.      Marlan Palau, M.D.  Electronically Signed     CKW/MEDQ  D:  07/20/2005  T:  07/20/2005  Job:  [id number]   cc:   Rosalyn Gess. Norins, M.D. [facility]  [address]  [place]  [place] [address]   [facility]

## 2010-06-03 NOTE — Discharge Summary (Signed)
[name], Elizabeth Wall                        ACCOUNT NO.:  [id number]   MEDICAL RECORD NO.:  [id number]                   PATIENT TYPE:  OIB   LOCATION:  5735                                 FACILITY:  [facility]   PHYSICIAN:  Salvadore Farber, M.D. [facility]         DATE OF BIRTH:  [birth year]   DATE OF ADMISSION:  07/28/2003  DATE OF DISCHARGE:  07/29/2003                           DISCHARGE SUMMARY - REFERRING   PROCEDURE:  Renal angiogram/bilateral stenting July 28, 2003.   REASON FOR ADMISSION:  Please refer to dictated admission note.   LABORATORY DATA:  Hemoglobin 11.2, hematocrit 32 (MCV 88), platelets 318 at  discharge.  Potassium 4.4, BUN 12, creatinine 0.8, glucose 112 at discharge.   Admission chest x-ray:  COPD.   HOSPITAL COURSE:  The patient presented for elective renal angiogram,  performed by Salvadore Farber, M.D., (see report for full details),  following recent CT angiogram revealing bilateral renal artery stenosis.  This was in the setting of severe hypertension, requiring multimodal  therapy.   The patient was found to have a 70% right renal artery lesion and 90% left  renal artery stenosis.  Distal aortography revealed no significant disease  in bilateral iliac arteries.   The patient underwent successful stenting of both renal arteries with no  noted complications.  Antihypertensives were placed on hold that evening,  but resumed the following morning.   The patient was clear for discharge the following morning by Dr. Samule Ohm in  hemodynamically stable condition.  There were no noted complications of both  groins.  Blood pressure was labile (155-197 systolic).   MEDICATIONS ADJUSTMENTS:  Plavix (x1 month).   The patient will need follow-up renal ultrasound in six months.   DISCHARGE MEDICATIONS:  1. Plavix 75 mg daily (x1 month).  2. Enteric coated aspirin 325 mg daily.  3. Digoxin 0.125 daily.  4. Levothyroxine 0.075 daily.  5. Protonix 40 daily.  6.  Toprol XL 100 daily.  7. Effexor 75 daily.  8. Clonidine 0.1 b.i.d.  9. Toprol 100 daily.  10.      Hydralazine 10 t.i.d.  11.      Lasix 40 daily.  12.      K-Dur 20 daily.  13.      Lisinopril 40 daily.  14.      Diltiazem 240 daily.  15.      Diclofenac 50 b.i.d.   DISCHARGE INSTRUCTIONS:  No heavy lifting/driving x2 days; maintain low  fat/cholesterol diet; call the office if there is any swelling/bleeding of  the groin.   The patient is instructed to page Dr. Randa Evens if he develops any  symptoms of lightheadedness or dizziness within the next 2-3 days.   The patient is scheduled to follow up with Dr. Foy [place].A. clinic on  Monday, August 03, 2003, at 10:30 a.m.  She will then follow up with Dr.  Samule Ohm in one month.   DISCHARGE DIAGNOSES:  1.  Peripheral vascular disease.     a. Status post bilateral renal artery stenting July 28, 2003.  2. Hypertension.  3. Hypothyroidism.  4. Gastroesophageal reflux disease.      Gene Serpe, P.A. [facility]                      Salvadore Farber, M.D. [facility]    GS/MEDQ  D:  07/29/2003  T:  07/29/2003  Job:  [id number]   cc:   Rosalyn Gess. Norins, M.D. [facility]

## 2010-06-03 NOTE — Progress Notes (Signed)
[facility]                          PERIPHERAL VASCULAR OFFICE NOTE   ALIENA, GHRIST                     MRN:          [id number]  DATE:08/29/2005                            DOB:          19-Nov-1919    REASON FOR VISIT:  The patient returns for followup regarding her renal  artery stenosis.   HISTORY OF PRESENT ILLNESS:  [name] is an 77 year old lady status post  stenting of both renal arteries in June 2005  (5 x 12 mm Genesis on the right and 5 x 15 mm Blue on the left).  Blood  pressures have always been labile, but did appear to improve after her  stenting procedure.  Repeat duplex a year ago suggested at least moderate  restenosis on the right with peak systolic velocity of 349 cm/second and  renal aortic ratio of 2.8.  Since renal function remained stable with stable  kidney sizes and what appeared to be fairly well controlled hypertension, we  elected conservative management.   Over the past year, her blood pressure has remained labile, but the mean  appears to have been substantially higher.  Repeat duplex examination  performed on May 02, 2005, demonstrated progression of the restenosis on  the right, now with a peak systolic velocity of 401 cm/second, and a renal  aortic ratio of 3.55.  On the left, there was what appeared to be less than  60% restenosis with a peak systolic velocity of 264 cm/second, and a renal  aortic ratio of 2.3.  The right kidney remained 10.8 x 4.7 cm, and the left,  9.7 x 5.1 cm.  There was difficulty in visualizing the left renal artery and  the velocity may be underestimated.   In the interim, Ms. Alwin has missed a follow up appointment due to a  stroke.  She has some residual left hand weakness.  She is left-handed, but  writes with her right hand.   She has not had any chest pain, exertional dyspnea, PND, orthopnea, visual  disturbance, or orthostatic dizziness.  She is quite concerned that  she will  develop renal failure like her husband.   CURRENT MEDICATIONS:  1. Digoxin 125 mcg daily.  2. Toprol XL 100 mg daily.  3. K-Dur 20 mEq b.i.d.  4. Levothyroxine 75 mcg daily.  5. Prilosec 20 mg daily.  6. Multivitamin.  7. Clonidine 0.2 mg b.i.d.  8. Lasix 80 mg daily.  9. Flonase.  10.Clonidine 0.1 mg p.r.n. blood pressure greater than 160.  11.Aggrenox one b.i.d.  12.Lotrel 10/40 mg one daily.   PAST MEDICAL HISTORY:  1. Atherosclerotic renal artery stenosis as above.  2. Hypertension which has been quite labile.  3. Hypothyroidism.  4. Stage III chronic kidney disease.  5. Degenerative joint disease.  6. Mitral valve prolapse.  7. Osteoarthritis.  8. Status post cholecystectomy.  9. Left hand surgery after trauma.  10.Status post hysterectomy.  11.Status post bladder tack.  12.GERD.  13.History of depression.   ALLERGIES:  SULFA.   SOCIAL HISTORY:  The patient is a retired Catering manager.  Her husband died  earlier  this year in the setting of renal failure and multiple strokes.  She  enjoys working in her yard.  She has never smoked and denies alcohol abuse.  Her son, Chrissie Noa, is a physician in [place], [place].  I spoke with him  over the phone today.  His cell phone number is [phone].   FAMILY HISTORY:  Father died at 87 of suicide.  Mother died at 29 of heart  failure.  A sister died in her 33s of suicide.  A daughter also died of  suicide.  Two brothers are alive and well in their 33s.   REVIEW OF SYSTEMS:  Wears glasses, occasional palpitations, seasonal  allergies.  Otherwise, negative in detail except as above.   PHYSICAL EXAMINATION:  GENERAL:  She is generally well-appearing, in no  distress.  VITAL SIGNS:  Heart rate 74, blood pressure 140/78 on the left, and 142/80  on the right.  Weight is 141 pounds which is down 10 pounds from a year ago.  HEENT:  Normal.  MUSCULOSKELETAL:  Normal.  SKIN:  Normal.  NECK:  She has no jugular venous  distention, thyromegaly, or  lymphadenopathy.  RESPIRATORY:  Respiratory effort is normal.  LUNGS:  Clear to auscultation.  There is a non-displaced point of maximal  cardiac impulse.  HEART:  There is a regular rate and rhythm.  There is a prominent click and  a S4, but no S3 or murmur.  Left ventricular impulse is mildly sustained.  ABDOMEN:  Soft, nontender, nondistended.  There is no pulsatile abdominal  mass and no bruit.  There is no hepatosplenomegaly.  EXTREMITIES:  Warm with 1+ edema bilaterally.  Carotid pulses are 2+  bilaterally with a soft bruit on the left.  Femoral pulses are 2+  bilaterally without bruit.  DP pulses are 2+ bilaterally.  She is alert and  oriented x3 with normal affect.  Mild left hand weakness, full strength in  her left leg.  Face is symmetric.   In summary, Ms. Hafen appears to have restenosis within the right renal  stent and may have restenosis within the left renal artery stent.  Her blood  pressure remains labile, but generally has been fairly high.  Most recent  creatinine check was 1.3, which is essentially stable for her.  I concur  with Dr. Eliott Nine recommendation for repeat angiography with an I2 repeat  revascularization if severe restenosis is confirmed.  I discussed this with  the patient's son, Dr. Evlyn Kanner, today.  He concurs as well.  I will schedule  her for angiography next week.  We will pre-treat her with sodium  bicarbonate to minimize the risk of contrast nephropathy.  I was clear with  her that repeat intervention for restenosis carries with it a risk of  recurrent restenosis as high as 40%.  She understands and desires to  proceed.                                   Salvadore Farber, MD   WED/MedQ  DD:  08/29/2005  DT:  08/29/2005  Job #:  [id number]   cc:   Rosalyn Gess. Norins, MD  [facility] Eliott Nine, MD

## 2010-06-03 NOTE — Assessment & Plan Note (Signed)
[facility]                               LIPID CLINIC NOTE   VEGAS, COFFIN                     MRN:          [id number]  DATE:10/11/2007                            DOB:          12-07-1919    [name] is seen back in Lipid Clinic for further evaluation and  medication titration associated with her hyperlipidemia.  Her current  therapies include, Crestor 5 mg daily and TriCor 48 mg daily with food.  She is following a heart healthy diet and continues to have foot pain  which prevents her regular exercise.  She is compliant with her  therapies and tolerating them well.  She has had no tobacco or alcohol  use.   PAST MEDICAL HISTORY:  Pertinent for class III renal insufficiency,  hyperlipidemia.   PHYSICAL EXAMINATION:  Weight today is 134 pounds, blood pressure is  150/50, heart rate is 68.   LABORATORY DATA:  Labs in May reveal total cholesterol 114, triglyceride  222, HDL 15, LDL 64.4, LFTs were within normal limits.   ASSESSMENT:  The patient from an average experience appears to be  following her therapies and doing well.  She has had her labs drawn and  these will be drawn on September 16, 2007.  We will call with followup and  plan as the results were obtained, and the patient was contacted by  telephone.  The September 2 lab draw revealed direct LDL of 76.7, normal  LFTs, and a lipid profile with total cholesterol 183, triglyceride 430,  HDL 9.7.  Based on these labs, I discussed the labs with the patient and  we determined that we would obtain a TSH as well.  The TSH came back  approximately 2 weeks later and I again spoke with the patient on the  phone because we found that those labs were in fact, the TSH was low.  I  faxed these labs at the request of the patient over to Dr. Jenita Seashore office and called to confirm that they are received.  [name]  will make no changes in her lipid lowering therapies at this time and  will follow up in the next 4 weeks with a plan.      Shelby Dubin, PharmD, BCPS, CPP  Electronically Signed      Rollene Rotunda, MD, [facility]  Electronically Signed   MP/MedQ  DD: 10/11/2007  DT: 10/12/2007  Job #: [id number]

## 2010-06-03 NOTE — Op Note (Signed)
NAME:  Elizabeth Wall, Elizabeth Wall                        ACCOUNT NO.:  [id number]   MEDICAL RECORD NO.:  [id number]                   PATIENT TYPE:  OIB   LOCATION:  5735                                 FACILITY:  [facility]   PHYSICIAN:  Salvadore Farber, M.D. [facility]         DATE OF BIRTH:  Dec 25, 1919   DATE OF PROCEDURE:  07/28/2003  DATE OF DISCHARGE:                                 OPERATIVE REPORT   PROCEDURES:  1. Selective bilateral renal angiography.  2. Abdominal aortography.  3. Stenting of bilateral renal arteries.  4. Angioseal closure of the right common femoral arteriotomy.   INDICATIONS:  Ms. Barnfield is an 77 year old lady with longstanding  hypertension, which has recently become uncontrolled despite progressive  intensification of her medical regimen.  It has currently been ranging in  the 180-220 systolic range despite compliance with substantial doses of six  medications.  Creatinine is 1.1.  CT angiogram performed on May 5  demonstrated single right renal arteries bilaterally with an estimated 50-  70% right renal artery stenosis and 70-80% left renal artery stenosis.  Based on her uncontrolled malignant hypertension and renal artery stenosis,  she was referred for angiography with an eye to percutaneous  revascularization.   PROCEDURAL TECHNIQUE:  Informed consent was obtained.  Under 1% lidocaine  local anesthesia a 5 French sheath was placed in the right common femoral  artery using the modified Seldinger technique.  A pigtail catheter was  advanced to the suprarenal abdominal aorta over a J-wire.  Abdominal  aortography was performed by power injection.  Selective bilateral renal  angiography was then performed by hand injection using a 5 [place] SOS Omni  catheter.  This confirmed severe stenosis of both renal arteries as detailed  below.  A decision was thus made to proceed to percutaneous  revascularization.   Anticoagulation was initiated with heparin.  After 5000 units,  the ACT was  237.  An additional 1000 units was administered.  I began with the right  renal artery.  Using no-touch technique, the artery was wired using a  stabilizer wire via a 6 French LIMA guide.  The lesion was then pre-dilated  using a 5.0 mm Aviator at 10 atmospheres.  It was then stented using a 5.0 x  12 mm Genesis on Aviator deployed at 10 atmospheres.  The ostium was further  dilated at 12 atmosphere.  Final angiogram demonstrated no residual stenosis  and a normal nephrogram with normal flow into the kidney.   Attention was then turned to the left renal artery.  I was unable to engage  the LIMA guide in the sharply downgoing renal artery.  It was therefore  engaged using the SOS Omni catheter, which had been advanced in a  telescoping fashion through the guide.  The wire was advanced.  During  manipulations, the wire was removed.  The wire was then repositioned using  the same technique.  The diagnostic catheter was  removed and guide advanced.  The lesion was then pre-dilated using a 5.0 mm Aviator balloon at 10  atmospheres.  During attempts to advance the stent, the wire became  dislodged.  The lesion was then rewired using the same telescoping technique  as initially.  There was no difficulty in rewiring.  The lesion was then  stented using a 5.0 x 15 mm Blue stent deployed at 14 atmospheres.  There  appeared to be damage to the tip of the guide.  Therefore, the guide,  balloon, and wire were removed.  The pigtail catheter was readvanced to the  suprarenal abdominal aorta.  Final angiography was performed by power  injection.  This confirmed no residual stenosis and normal nephrogram in the  left kidney as well.  Both stents were very well-apposed with no evidence of  dissection.   Finally, after imaging of the right femoral artery, the arteriotomy was  closed using a 6 [place] Angioseal device.  Complete hemostasis was obtained.  The patient tolerated the procedure well  and was transferred to the holding  room in stable condition.   COMPLICATIONS:  None.   FINDINGS:  1. Abdominal aorta:  Minimal atherosclerotic plaquing.  Both common iliacs     and proximal portion of the external iliacs are normal.  Both internal     iliacs are patent.  2. Right renal artery:  Single renal artery on this side.  There was a 70%     stenosis associated with a 27 mmHg mean gradient as assessed with a 5     French catheter.  This was stented to no residual stenosis.  3. Left renal artery:  90% stenosis stented and no residual.   IMPRESSION/RECOMMENDATION:  Severe bilateral renal artery stenosis, which  was successfully stented with excellent angiographic result.  Will need very  careful attention to her blood pressure in the hours to come.  Will continue  to hold antihypertensives for the next several hours but will plan on  reinstituting them in a stepwise fashion thereafter, depending upon blood  pressure.                                               Salvadore Farber, M.D. [facility]    WED/MEDQ  D:  07/28/2003  T:  07/28/2003  Job:  [id number]   cc:   Rosalyn Gess. Norins, M.D. [facility]   Peripheral Vascular Lab

## 2010-06-08 ENCOUNTER — Telehealth: Payer: Self-pay | Admitting: *Deleted

## 2010-06-08 NOTE — Telephone Encounter (Signed)
Dr Ewell Poe office called, they will be faxing labs to my attention but would like PCP to be notified of BUN 72 and Creat 2.2

## 2010-06-10 NOTE — Telephone Encounter (Signed)
Labs ordered at hospital encounter. I did not order them. Patinet not seen for a year. No striking abnormalties except dropping Hgb. Copy to dr. Freida Busman [facility] [place].

## 2010-06-14 NOTE — Telephone Encounter (Signed)
Left detailed vm for pt to call office with name of PCP.

## 2010-06-15 NOTE — Telephone Encounter (Signed)
PT called back and left vm - She did confirm that Dr Debby Bud is her PCP. She needs OV per MD for f/u regarding labs. Please scheduled 30 min spot this week or next. THANKS

## 2010-06-20 ENCOUNTER — Ambulatory Visit: Payer: Medicare Other | Admitting: Surgery

## 2010-06-20 ENCOUNTER — Other Ambulatory Visit: Payer: Self-pay | Admitting: *Deleted

## 2010-06-20 ENCOUNTER — Other Ambulatory Visit: Payer: Medicare Other

## 2010-06-20 MED ORDER — AMIODARONE HCL 200 MG PO TABS: 200.0000 mg | ORAL_TABLET | Freq: Every day | ORAL | Status: DC

## 2010-06-23 ENCOUNTER — Encounter: Payer: Self-pay | Admitting: Cardiovascular Disease

## 2010-06-23 ENCOUNTER — Ambulatory Visit (INDEPENDENT_AMBULATORY_CARE_PROVIDER_SITE_OTHER): Payer: Medicare Other | Admitting: Cardiovascular Disease

## 2010-06-23 DIAGNOSIS — I4892 Unspecified atrial flutter: Secondary | ICD-10-CM

## 2010-06-23 DIAGNOSIS — I509 Heart failure, unspecified: Secondary | ICD-10-CM

## 2010-06-23 DIAGNOSIS — I10 Essential (primary) hypertension: Secondary | ICD-10-CM

## 2010-06-23 NOTE — Progress Notes (Signed)
HPI  This is an 77 year old female who is here today for a followup visit. Overall, she has been doing reasonably well. However, she complains of generalized fatigue and having no energy with slight increase in her dyspnea. She denies any chest pain or palpitations. She had a relook angiography to her mesenteric artery and had another stent placed. She had slight worsening in her renal function. She is also anemic with the most recent hemoglobin of 8.4. Her blood pressure has been running lower than usual. She had few readings around 110 systolic but not lower than that. At that blood pressure she does not usually feel well. There has been no syncope or presyncope  Allergies  Allergen Reactions  . Augmentin   . Avelox (Moxifloxacin Hcl In Nacl)   . ZOX:[id number]+XBJYNWGNF+AOZHYQMVHQ Acid+Aspartame   . Moxifloxacin     REACTION: patient developed tremors and vibrato  . Sulfa Antibiotics   . Sulfamethoxazole     REACTION: unspecified  . Sulfonamide Derivatives      Current Outpatient Prescriptions on File Prior to Visit  Medication Sig Dispense Refill  . amLODipine (NORVASC) 10 MG tablet Take 10 mg by mouth daily.        Marland Kitchen aspirin 81 MG tablet Take 81 mg by mouth daily.        . furosemide (LASIX) 40 MG tablet TAKE 1 TABLET BY MOUTH TWICE DAILY  60 tablet  10  . hydrALAZINE (APRESOLINE) 50 MG tablet Take 50 mg by mouth 3 (three) times daily.        Marland Kitchen HYDROcodone-acetaminophen (NORCO) 5-325 MG per tablet Take 1 tablet by mouth at bedtime as needed. Take 1/2 tablet at bedtime as needed       . hydroxychloroquine (PLAQUENIL) 200 MG tablet Take 1 tablet by mouth daily.      Marland Kitchen levothyroxine (SYNTHROID, LEVOTHROID) 75 MCG tablet Take 75 mcg by mouth daily.        Marland Kitchen lisinopril (PRINIVIL,ZESTRIL) 20 MG tablet Take 20 mg by mouth daily.        . metoprolol (LOPRESSOR) 50 MG tablet Take 50 mg by mouth 2 (two) times daily.        . Multiple Vitamin (MULTIVITAMIN) capsule Take 1 capsule by mouth  daily. Take 1 tablet daily       . omeprazole (PRILOSEC) 20 MG capsule Take 20 mg by mouth daily.        Marland Kitchen PLAVIX 75 MG tablet Take 1 tablet by mouth Daily.      . predniSONE (DELTASONE) 5 MG tablet Take 5 mg by mouth daily as needed.        . Probiotic Product (PROBIOTIC PO) Take 1 tablet by mouth daily.        Marland Kitchen DISCONTD: amiodarone (PACERONE) 200 MG tablet Take 1 tablet (200 mg total) by mouth daily.  30 tablet  6     Past Medical History  Diagnosis Date  . Chronic kidney disease     chronic  . CVA (cerebral vascular accident)   . Cardiomyopathy     EF 40%. likely tachycardia induced  . Dyspepsia   . Peripheral vascular disease   . 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 Thyroid disease     hypothyroidism  . Hypertension   . Renovascular hypertension     s/p stenting  . Atrial flutter March 2011    s/p ablation   . CHF (congestive heart failure)     EF  40%     Past Surgical History  Procedure Date  . Cardiac catheterization   . Cholecystectomy     laporascopic  . Abdominal hysterectomy   . Oophorectomy   . Tonsillectomy   . Ras stents 2005    bilateral     No family history on file.   History   Social History  . Marital Status: Widowed    Spouse Name: N/A    Number of Children: N/A  . Years of Education: N/A   Occupational History  . Not on file.   Social History Main Topics  . Smoking status: Never Smoker   . Smokeless tobacco: Not on file  . Alcohol Use: Not on file  . Drug Use: Not on file  . Sexually Active: Not on file   Other Topics Concern  . Not on file   Social History Narrative  . No narrative on file       PHYSICAL EXAM   BP 144/61  Pulse 56  Ht 5\' 3"  (1.6 m)  Wt 131 lb (59.421 kg)  BMI 23.21 kg/m2  SpO2 95%  Constitutional: She is oriented to person, place, and time. She appears well-developed and well-nourished. No distress.  HENT: No nasal discharge.  Head: Normocephalic and  atraumatic.  Eyes: Pupils are equal, round, and reactive to light. Right eye exhibits no discharge. Left eye exhibits no discharge.  Neck: Normal range of motion. Neck supple. No JVD present. No thyromegaly present.  Cardiovascular: Normal rate, regular rhythm, normal heart sounds and intact distal pulses. Exam reveals no gallop and no friction rub.  No murmur heard.  Pulmonary/Chest: Effort normal and breath sounds normal. No stridor. No respiratory distress. She has no wheezes. She has no rales. She exhibits no tenderness.  Abdominal: Soft. Bowel sounds are normal. She exhibits no distension. There is no tenderness. There is no rebound and no guarding.  Musculoskeletal: Normal range of motion. She exhibits +1 edema and no tenderness.  Neurological: She is alert and oriented to person, place, and time. Coordination normal.  Skin: Skin is warm and dry. There is extensive bruising. She is not diaphoretic. No erythema. No pallor.  Psychiatric: She has a normal mood and affect. Her behavior is normal. Judgment and thought content normal.      ASSESSMENT AND PLAN

## 2010-06-23 NOTE — Patient Instructions (Signed)
Your physician recommends that you schedule a follow-up appointment in: 3 months  Your physician has recommended you make the following change in your medication: STOP Amiodarone

## 2010-06-23 NOTE — Assessment & Plan Note (Signed)
Her blood pressure is reasonably controlled. Continue current treatment. She usually does not feel well if her blood pressure is lower than that.

## 2010-06-23 NOTE — Assessment & Plan Note (Signed)
The patient had an atrial flutter that was treated last year by ablation. Since then she had no recurrent atrial arrhythmia. Thus, I will go ahead and stop amiodarone especially that she is mildly bradycardic which might be contributing to some of her symptoms of fatigue. I explained to her that there is a chance of recurrent atrial arrhythmia off amiodarone. However, I think the risks of long-term use of amiodarone outweighed the benefit in this situation given that she had a curative procedure. I would continue dual antiplatelet therapy with aspirin and Plavix. If the patient continues to be significantly anemic, would consider stopping one of them down the Road.

## 2010-06-23 NOTE — Assessment & Plan Note (Signed)
Most recent ejection fraction was 40%. The patient seems to be stable with no significant fluid overload except for slight increase in her lower extremity edema which usually gets better after a few days. I advised the patient to followup with her primary care physician for further evaluation of her anemia as well as chronic kidney disease which is likely contributing to her generalized fatigue and weakness.

## 2010-06-24 ENCOUNTER — Ambulatory Visit: Payer: Medicare Other | Admitting: Internal Medicine

## 2010-06-27 ENCOUNTER — Other Ambulatory Visit: Payer: Medicare Other

## 2010-06-27 ENCOUNTER — Ambulatory Visit: Payer: Medicare Other | Admitting: Surgery

## 2010-06-27 ENCOUNTER — Other Ambulatory Visit (INDEPENDENT_AMBULATORY_CARE_PROVIDER_SITE_OTHER): Payer: Medicare Other

## 2010-06-27 ENCOUNTER — Encounter: Payer: Self-pay | Admitting: Internal Medicine

## 2010-06-27 ENCOUNTER — Ambulatory Visit (INDEPENDENT_AMBULATORY_CARE_PROVIDER_SITE_OTHER): Payer: Medicare Other | Admitting: Internal Medicine

## 2010-06-27 DIAGNOSIS — E039 Hypothyroidism, unspecified: Secondary | ICD-10-CM

## 2010-06-27 DIAGNOSIS — D509 Iron deficiency anemia, unspecified: Secondary | ICD-10-CM

## 2010-06-27 DIAGNOSIS — Z79899 Other long term (current) drug therapy: Secondary | ICD-10-CM

## 2010-06-27 LAB — T4, FREE: Free T4: 1.63 ng/dL — ABNORMAL HIGH (ref 0.60–1.60)

## 2010-06-27 MED ORDER — FERROUS GLUCONATE IRON 246 (28 FE) MG PO TABS: 246.0000 mg | ORAL_TABLET | Freq: Two times a day (BID) | ORAL | Status: DC

## 2010-06-27 NOTE — Progress Notes (Signed)
Subjective:    Patient ID: Elizabeth Wall, female    DOB: 31-Aug-1919, 77 y.o.   MRN: [id number]  HPI Elizabeth Wall has not been seen for some time. She has been followed by Dr. Kirke Corin, cardiology in [place], who has been managing her HTN. She has also seen Dr. Dareen Piano for rheumatology.   In the interval since her last PCP visit she has been doing relatively well. She presents today with malaise and fatigue, no "pep" or "get up and go." She has a history of anemia with iron deficiency in the past with an Fe sat down to 6%. Her most recent lab did reveal a Hgb of 8.4g. She has had no signs of bleeding denying dark or black stools or any overt bleeding. She has had a colonoscopy in the last 10 years. She denies any chest pain, marked shortness of breath and has no GI complaints.  Her lichen planus has been in remission. She does have some residual skin changes but no active skin lesions.   Past Medical History  Diagnosis Date  . Chronic kidney disease     chronic  . CVA (cerebral vascular accident)   . Cardiomyopathy     EF 40%. likely tachycardia induced  . Dyspepsia   . Peripheral vascular disease   . 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 Thyroid disease     hypothyroidism  . Hypertension   . Renovascular hypertension     s/p stenting  . Atrial flutter March 2011    s/p ablation   . CHF (congestive heart failure)     EF 40%   Past Surgical History  Procedure Date  . Cardiac catheterization   . Cholecystectomy     laporascopic  . Abdominal hysterectomy   . Oophorectomy   . Tonsillectomy   . Ras stents 2005    bilateral   No family history on file. History   Social History  . Marital Status: Widowed    Spouse Name: N/A    Number of Children: N/A  . Years of Education: N/A   Occupational History  . Not on file.   Social History Main Topics  . Smoking status: Never Smoker   . Smokeless tobacco: Never Used  . Alcohol Use: No  .  Drug Use: No  . Sexually Active: No   Other Topics Concern  . Not on file   Social History Narrative   lives alone. I-ADL's. married 60 yrs, widowed May 28,'07. 2 miscarriages, 2 sons: '50, '53. 1 daughter: '48. 7 grandchildren       Review of Systems Review of Systems  Constitutional:  Negative for fever, chills, activity change and unexpected weight change.  HEENT:  Negative for hearing loss, ear pain, congestion, neck stiffness and postnasal drip. Negative for sore throat or swallowing problems. Negative for dental complaints.   Eyes: Negative for vision loss or change in visual acuity.  Respiratory: Negative for chest tightness and wheezing.   Cardiovascular: Negative for chest pain and palpitation No decreased exercise tolerance Gastrointestinal: No change in bowel habit. No bloating or gas. No reflux or indigestion Genitourinary: Negative for urgency, frequency, flank pain and difficulty urinating.  Musculoskeletal: Negative for myalgias, back pain, arthralgias and gait problem.  Neurological: Negative for dizziness, tremors, weakness and headaches.  Hematological: Negative for adenopathy.  Psychiatric/Behavioral: Negative for behavioral problems and dysphoric mood.        Objective:   Physical Exam Vitals noted.  Gen'l - a very spry and youthful 77 y/o white woman in no acute distress HEENT - C&S clear, PERRLA Neck- supple :Pul - normal respirations Cor - RRR       Assessment & Plan:

## 2010-06-27 NOTE — Patient Instructions (Signed)
All things heart and blood pressure left to Dr. Kirke Corin  Anemia - reviewed labs back to Feb '11 - you were iron deficient then. Plan - lab today to check on iron level and bone marrow activity. Will start iron supplementation twice a day.   Thyroid - will check thyroid labs today.    Iron Deficiency Anemia There are many types of anemia. Iron deficiency anemia is the most common. Iron deficiency anemia is a decrease in the number of red blood cells caused by too little iron. Without enough iron, your body does not produce enough hemoglobin. Hemoglobin is a substance in red blood cells that carries oxygen to the body's tissues. Iron deficiency anemia may leave you tired and short of breath. CAUSES  Lack of iron in the diet.   This may be seen in infants and children, because there is little iron in milk.   This may be seen in adults who do not eat enough iron-rich foods.   This may be seen in pregnant or breastfeeding women who do not take iron supplements. There is a much higher need for iron intake at these times.   Poor absorption of iron, as seen with intestinal disorders, such as surgical removal of the small intestines or intestinal bypass.   Intestinal bleeding.   Heavy periods.  SYMPTOMS Mild anemia may not be noticeable. Symptoms may include:  Fatigue.   Headache.   Pale skin.   Weakness.   Shortness of breath.   Dizziness.   Cold hands and feet.   Fast or irregular heartbeat.  DIAGNOSIS Diagnosis requires a thorough evaluation and physical exam by your caregiver.  Blood tests are generally used to confirm iron deficiency anemia.   Additional tests may be done to find the underlying cause of your anemia. This may include:   Testing for blood in the stool (fecal occult blood test).   A procedure to see inside the colon and rectum (colonoscopy).   A procedure to see inside the esophagus and stomach (endoscopy).  TREATMENT  Correcting the cause of the iron  deficiency is the first step.   Medicines, such as oral contraceptives, can make heavy menstrual flows lighter.   Antibiotics and other medicines can be used to treat peptic ulcers.   Surgery may be needed to remove a bleeding polyp, tumor, or fibroid.   Often, iron supplements (ferrous sulfate) are taken.   For the best iron absorption, take these supplements with an empty stomach.   You may need to take the supplements with food if you cannot tolerate them on an empty stomach. Vitamin C improves the absorption of iron. Your caregiver might recommend taking your iron tablets with a glass of orange juice or vitamin C supplement.   Milk and antacids should not be taken at the same time as iron supplements. They may interfere with the absorption of iron.   Iron supplements can cause constipation. A stool softener is often recommended.   Pregnant and breastfeeding women will need to take extra iron, because their normal diet usually will not provide the required amount.   Patients who cannot tolerate iron by mouth can take it through a vein (intravenously) or by an injection into the muscle.  HOME CARE INSTRUCTIONS  Ask your dietitian for help with diet questions.   Take iron and vitamins as directed by your caregiver.   Eat a diet rich in iron. Eat liver, lean beef, whole-grain bread, eggs, dried fruit, and dark green, leafy vegetables.  SEEK IMMEDIATE MEDICAL CARE IF:  You have a fainting episode. Do not drive yourself. Call your local emergency services - (911 in [place] if no other help is available.   You have chest pain, nausea, or vomiting.   You develop severe or increased shortness of breath with activities.   You develop weakness or increased thirst.   You have a rapid heartbeat.   You develop unexplained sweating or become lightheaded when getting up from a chair or bed.  MAKE SURE YOU:  Understand these instructions.   Will watch your condition.   Will get help  right away if you are not doing well or get worse.  Document Released: 12/31/1999 Document Re-Released: 06/22/2009 [facility] Patient Information 2011 [place], [place].

## 2010-06-27 NOTE — Assessment & Plan Note (Addendum)
Patient with malaise and weakness which she is concerned may be coming from anemia. History of iron deficiency. No report os signs of active bleeding.   Plan - repeat CBC, Retic count, iron studies.           Will start on ferrous gluconate bid.   Addendum - Fe 37, % sat 6.8, B12 650  Plan - iron supplement twice a day.

## 2010-06-27 NOTE — Assessment & Plan Note (Signed)
Chroinc hypothyroidism. She is having increased malaise and weakness  Plan - check thyroid functions on present dose of medication  Addendum - TSH 1.35, FT4 1.63 - normal values.

## 2010-06-28 ENCOUNTER — Encounter: Payer: Self-pay | Admitting: Internal Medicine

## 2010-07-04 ENCOUNTER — Other Ambulatory Visit: Payer: Self-pay | Admitting: *Deleted

## 2010-07-04 MED ORDER — AMIODARONE HCL 200 MG PO TABS: 200.0000 mg | ORAL_TABLET | Freq: Every day | ORAL | Status: DC

## 2010-07-21 ENCOUNTER — Other Ambulatory Visit: Payer: Self-pay | Admitting: Cardiovascular Disease

## 2010-07-21 NOTE — Telephone Encounter (Signed)
Pt seen by MD.

## 2010-07-25 ENCOUNTER — Other Ambulatory Visit (INDEPENDENT_AMBULATORY_CARE_PROVIDER_SITE_OTHER): Payer: Medicare Other

## 2010-07-25 ENCOUNTER — Ambulatory Visit (INDEPENDENT_AMBULATORY_CARE_PROVIDER_SITE_OTHER): Payer: Medicare Other | Admitting: Surgery

## 2010-07-25 DIAGNOSIS — K551 Chronic vascular disorders of intestine: Secondary | ICD-10-CM

## 2010-07-25 DIAGNOSIS — Z48812 Encounter for surgical aftercare following surgery on the circulatory system: Secondary | ICD-10-CM

## 2010-07-26 NOTE — Assessment & Plan Note (Signed)
OFFICE VISIT  GERMANY, DODGEN DOB:  02/07/1919                                       07/25/2010 [id number]#:[id number]  REASON FOR VISIT:  Followup SMA stent.  HISTORY:  This is an 77 year old female who underwent mesenteric stenting on January 11, with significant improvement in her ability to eat.  Ultrasound detected a stenosis and on April 24 she underwent repeat intervention.  She is back again for followup.  On April 24 she had a repeat stenting using a 6 x 12 Herculink.  Again, the patient is without symptoms.  PHYSICAL EXAM:  Vital signs:  Heart rate 77, blood pressure 187/72, O2 sats 97%.  General:  She is well-appearing, in no distress.  Abdomen: Soft, nontender.  No bruits.  Respirations:  Nonlabored. Cardiovascular:  Regular rate and rhythm.  DIAGNOSTIC STUDIES:  Ultrasound shows elevated velocities within the superior mesenteric artery at greater than 400.  ASSESSMENT AND PLAN:  Mesenteric stenosis status post stenting x2.  I discussed with the patient the findings of the ultrasound which revealed elevated velocities and stenosis within her stents.  Since she has recently undergone stenting, I know that she was left with residual stenosis likely from the significant calcium burden at the origin which I could get opened up with ballooning and restenting.  I feel like there is a good flow channel within her superior mesenteric artery and as long as she remains asymptomatic I am not planning on chasing down a number on ultrasound.  I did tell her that if she does become symptomatic for a day or two to contact me as we would urgently need to proceed with repeat angiogram.  She understands this and we will keep her on our surveillance protocol.  The next study will be in 3 months.    Jorge Ny, MD Electronically Signed  VWB/MEDQ  D:  07/25/2010  T:  07/26/2010  Job:  [id number]

## 2010-08-09 NOTE — Procedures (Unsigned)
MESENTERIC ARTERIAL DUPLEX EVALUATION  INDICATION:  Chronic intestinal insufficiency, history of SMA stent placement.  HISTORY: Diabetes:  No. Cardiac:  Yes, CAD. Hypertension:  Yes. Smoking:  No.  Mesenteric Duplex Findings: Aorta - Proximal                            121 cm/s Aorta - Mid                                 280 cm/s Aorta - Distal                              248 cm/s  Celiac Trunk - Proximal                     449 cm/s Celiac Trunk - Distal                       213 cm/s  Hepatic Artery                              230 cm/s Splenic Artery                              174 cm/s  Superior Mesenteric Artery-Origin           428 cm/s Superior Mesenteric Artery-Proximal         418 cm/s x 56 cm/s Superior Mesenteric Artery-Mid              435 cm/s Superior Mesenteric Artery-Distal           307 cm/s  Inferior Mesenteric Artery-Proximal         81 cm/s     IMPRESSION: 1. A greater than 75% stenosis was present involving the celiac access     by peak systolic velocity criteria. 2. A greater than 75% stenosis was present involving the superior     mesenteric artery proximal by peak systolic velocity and end     diastolic velocity criteria. 3. The inferior mesenteric artery is patent with normal velocity     present. 4. The splenic and hepatic arteries are patent with antegrade flow. 5. Dense calcified plaque is noted throughout the aorta with elevated     velocities present suggestive of stenosis in the mid segment with a     peak systolic velocity of 280 cm/s. 6. Increase in the disease process since previous study on 04/11/2010.     Superior mesenteric artery stent is not adequately visualized due     to acoustic shadowing and calcification.  ___________________________________________ V. Charlena Cross, MD  SH/MEDQ  D:  07/25/2010  T:  07/25/2010  Job:  [id number]

## 2010-08-22 ENCOUNTER — Other Ambulatory Visit: Payer: Self-pay | Admitting: *Deleted

## 2010-08-22 MED ORDER — METOPROLOL TARTRATE 50 MG PO TABS: 50.0000 mg | ORAL_TABLET | Freq: Two times a day (BID) | ORAL | Status: DC

## 2010-09-26 ENCOUNTER — Encounter: Payer: Self-pay | Admitting: Cardiovascular Disease

## 2010-09-28 ENCOUNTER — Encounter: Payer: Self-pay | Admitting: Surgery

## 2010-09-29 ENCOUNTER — Encounter: Payer: Self-pay | Admitting: Cardiovascular Disease

## 2010-09-29 ENCOUNTER — Ambulatory Visit (INDEPENDENT_AMBULATORY_CARE_PROVIDER_SITE_OTHER): Payer: Medicare Other | Admitting: Cardiovascular Disease

## 2010-09-29 VITALS — BP 183/73 | HR 67 | Ht 62.0 in | Wt 129.0 lb

## 2010-09-29 DIAGNOSIS — I509 Heart failure, unspecified: Secondary | ICD-10-CM

## 2010-09-29 DIAGNOSIS — I4892 Unspecified atrial flutter: Secondary | ICD-10-CM

## 2010-09-29 NOTE — Progress Notes (Signed)
HPI  This is an 77 year old female who is here today for a followup visit. She has history of atrial flutter status post ablation last year. During her most recent visit, I stopped the amiodarone given that she has been in a sinus rhythm with no recurrent arrhythmia and due to the fact that the she had an ablation procedure done. She has no previous history of atrial fibrillation. She also has congestive heart failure with mildly reduced LV systolic function. Overall, she has been doing reasonably well. She denies any chest pain, dyspnea or palpitations. She has been having problems with her balance recently and fell in August with a bruising in her back and her arm. She has been having issues also with chronic kidney disease as well as anemia. She was started on iron replacement recently by Dr. Debby Bud.   Allergies  Allergen Reactions  . Augmentin   . Avelox (Moxifloxacin Hcl In Nacl)   . Clonidine Derivatives   . NWG:NFAOZHYQMVH+QIONGEXBM+WUXLKGMWNU Acid+Aspartame   . Moxifloxacin     REACTION: patient developed tremors and vibrato  . Sulfa Antibiotics   . Sulfamethoxazole     REACTION: unspecified  . Sulfonamide Derivatives      Current Outpatient Prescriptions on File Prior to Visit  Medication Sig Dispense Refill  . amLODipine (NORVASC) 10 MG tablet TAKE 1 TABLET BY MOUTH EVERY DAY  30 tablet  6  . aspirin 81 MG tablet Take 81 mg by mouth daily.        . ferrous gluconate (FERGON) 246 (28 FE) MG tablet Take 1 tablet (246 mg total) by mouth 2 (two) times daily after a meal.  60 tablet  12  . hydrALAZINE (APRESOLINE) 50 MG tablet TAKE 1 TABLET BY MOUTH 3 TIMES A DAY  90 tablet  6  . HYDROcodone-acetaminophen (NORCO) 5-325 MG per tablet Take 1 tablet by mouth at bedtime as needed. Take 1/2 tablet at bedtime as needed       . hydroxychloroquine (PLAQUENIL) 200 MG tablet Take 1 tablet by mouth daily.      Marland Kitchen levothyroxine (SYNTHROID, LEVOTHROID) 75 MCG tablet Take 75 mcg by mouth daily.         Marland Kitchen lisinopril (PRINIVIL,ZESTRIL) 20 MG tablet Take 20 mg by mouth daily.        . metoprolol (LOPRESSOR) 50 MG tablet Take 1 tablet (50 mg total) by mouth 2 (two) times daily.  60 tablet  5  . Multiple Vitamin (MULTIVITAMIN) capsule Take 1 capsule by mouth 3 (three) times daily.       Marland Kitchen omeprazole (PRILOSEC) 20 MG capsule Take 20 mg by mouth daily.        Marland Kitchen PLAVIX 75 MG tablet Take 1 tablet by mouth Daily.      . predniSONE (DELTASONE) 5 MG tablet Take 5 mg by mouth daily as needed.        . Probiotic Product (PROBIOTIC PO) Take 1 tablet by mouth daily.        Marland Kitchen ULORIC 40 MG tablet Take 1 tablet by mouth daily.         Past Medical History  Diagnosis Date  . Chronic kidney disease     chronic  . Cardiomyopathy     EF 40%. likely tachycardia induced  . Dyspepsia   . Peripheral vascular disease   . 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 Thyroid disease     hypothyroidism  . Hypertension   . Renovascular  hypertension     s/p stenting  . CHF (congestive heart failure)     EF 40%  . Arthritis   . CVA (cerebral vascular accident) 06/2005  . Mitral regurgitation   . Hiatal hernia   . Atrial flutter March 2011    s/p ablation      Past Surgical History  Procedure Date  . Cardiac catheterization   . Cholecystectomy     laporascopic  . Abdominal hysterectomy   . Oophorectomy   . Tonsillectomy   . Ras stents 2005    bilateral     Family History  Problem Relation Age of Onset  . Heart disease Mother      History   Social History  . Marital Status: Widowed    Spouse Name: N/A    Number of Children: N/A  . Years of Education: N/A   Occupational History  . Not on file.   Social History Main Topics  . Smoking status: Never Smoker   . Smokeless tobacco: Never Used  . Alcohol Use: No  . Drug Use: No  . Sexually Active: No   Other Topics Concern  . Not on file   Social History Narrative   lives alone. I-ADL's. married 60  yrs, widowed May 28,'07. 2 miscarriages, 2 sons: '50, '53. 1 daughter: '48. 7 grandchildren      PHYSICAL EXAM   BP 183/73  Pulse 67  Ht 5\' 2"  (1.575 m)  Wt 129 lb (58.514 kg)  BMI 23.59 kg/m2  SpO2 98%  Constitutional: She is oriented to person, place, and time. She appears well-developed and well-nourished. No distress.  HENT: No nasal discharge.  Head: Normocephalic and atraumatic.  Eyes: Pupils are equal, round, and reactive to light. Right eye exhibits no discharge. Left eye exhibits no discharge.  Neck: Normal range of motion. Neck supple. No JVD present. No thyromegaly present.  Cardiovascular: Normal rate, regular rhythm, normal heart sounds. Exam reveals no gallop and no friction rub.  No murmur heard.  Pulmonary/Chest: Effort normal and breath sounds normal. No stridor. No respiratory distress. She has no wheezes. She has no rales. She exhibits no tenderness.  Abdominal: Soft. Bowel sounds are normal. She exhibits no distension. There is no tenderness. There is no rebound and no guarding.  Musculoskeletal: Normal range of motion. She exhibits +1 edema worse on the left side and no tenderness.  Neurological: She is alert and oriented to person, place, and time. Coordination normal.  Skin: Skin is warm and dry. No rash noted. She is not diaphoretic. No erythema. No pallor.  Psychiatric: She has a normal mood and affect. Her behavior is normal. Judgment and thought content normal.    EKG: Normal sinus rhythm with premature atrial contractions. Incomplete right bundle branch block with nonspecific ST and T wave changes.   ASSESSMENT AND PLAN

## 2010-09-29 NOTE — Assessment & Plan Note (Signed)
She is status post an ablation procedure in 2011 without recurrent arrhythmia. I stopped amiodarone during her last visit and there has been been no evidence of recurrent atrial flutter or fibrillation. The patient has been having problems with anemia recently as well as balance problems. She actually had a fall last month. Given that she already had an ablation procedure done for atrial flutter and she does not have documented history of atrial fibrillation, I believe that the risk of long-term anticoagulation outweighs the benefit in her situation. Due to that I don't recommend resuming anticoagulation at this time. I will also stop Plavix and keep her on aspirin 81 mg once daily for the same reason.

## 2010-09-29 NOTE — Patient Instructions (Signed)
Your physician recommends that you schedule a follow-up appointment in: 4 months  

## 2010-09-29 NOTE — Assessment & Plan Note (Signed)
Her most recent ejection fraction was 40%. Continue medical therapy. She is already on a beta blocker and an ACE inhibitor. The dose of furosemide was increased recently by her nephrologist to 80 mg twice daily. Her kidney function has been stable.

## 2010-10-06 ENCOUNTER — Encounter: Payer: Self-pay | Admitting: Cardiovascular Disease

## 2010-10-26 ENCOUNTER — Other Ambulatory Visit: Payer: Self-pay | Admitting: Internal Medicine

## 2010-10-31 ENCOUNTER — Ambulatory Visit: Payer: Medicare Other | Admitting: Surgery

## 2010-10-31 ENCOUNTER — Other Ambulatory Visit: Payer: Medicare Other

## 2010-11-14 ENCOUNTER — Other Ambulatory Visit: Payer: Medicare Other

## 2010-11-14 ENCOUNTER — Ambulatory Visit: Payer: Medicare Other | Admitting: Surgery

## 2010-11-18 ENCOUNTER — Encounter: Payer: Self-pay | Admitting: Surgery

## 2010-11-21 ENCOUNTER — Ambulatory Visit: Payer: Medicare Other | Admitting: Vascular Surgery

## 2010-11-21 ENCOUNTER — Encounter: Payer: Self-pay | Admitting: Surgery

## 2010-11-21 ENCOUNTER — Other Ambulatory Visit (INDEPENDENT_AMBULATORY_CARE_PROVIDER_SITE_OTHER): Payer: Medicare Other | Admitting: Vascular Surgery

## 2010-11-21 ENCOUNTER — Ambulatory Visit (INDEPENDENT_AMBULATORY_CARE_PROVIDER_SITE_OTHER): Payer: Medicare Other | Admitting: Surgery

## 2010-11-21 VITALS — BP 202/74 | HR 63 | Ht 63.0 in | Wt 129.0 lb

## 2010-11-21 DIAGNOSIS — I701 Atherosclerosis of renal artery: Secondary | ICD-10-CM

## 2010-11-21 DIAGNOSIS — K55059 Acute (reversible) ischemia of intestine, part and extent unspecified: Secondary | ICD-10-CM

## 2010-11-21 DIAGNOSIS — K551 Chronic vascular disorders of intestine: Secondary | ICD-10-CM

## 2010-11-21 DIAGNOSIS — R109 Unspecified abdominal pain: Secondary | ICD-10-CM

## 2010-11-21 NOTE — Progress Notes (Signed)
[facility]   Patient name: Elizabeth Wall MRN: [id number] DOB: 08/03/1919 Sex: female     Chief Complaint  Patient presents with  . Mesenteric Insufficiency    3 month follow up, Pt c/o of heartburn pain.    HISTORY OF PRESENT ILLNESS: The patient is back today for continued followup of her chronic mesenteric ischemia. She underwent superior mesenteric artery stenting on 01/26/2010 with a dramatic improvement in her ability to tolerate food. On her followup surveillance ultrasound she was found to have recurrent stenosis. Therefore on April 24 she went back for repeat procedure. At this time she was found to have recurrent in-stent stenosis. I attempted balloon angioplasty however I did not get the desired results and therefore the lesion was resuspended this time using a 6 x 12 Herculink stent that was deployed approximately 2 mm more proximal to her previous stent. She was closed using a proglide. Her ultrasound 3 months ago showed elevated velocities again within her superior mesenteric artery. Because she was asymptomatic at that time I elected to continue with surveillance. She is back today for repeat imaging. She states that for the past week she is now having mild symptoms of heartburn with eating. She is taking her Beano again.  Past Medical History  Diagnosis Date  . Chronic kidney disease     chronic  . Cardiomyopathy     EF 40%. likely tachycardia induced  . Dyspepsia   . Peripheral vascular disease   . 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 Thyroid disease     hypothyroidism  . Hypertension   . Renovascular hypertension     s/p stenting  . CHF (congestive heart failure)     EF 40%  . Arthritis   . CVA (cerebral vascular accident) 06/2005  . Mitral regurgitation   . Hiatal hernia   . Atrial flutter March 2011    s/p ablation     Past Surgical History  Procedure Date  . Cardiac catheterization   .  Cholecystectomy     laporascopic  . Abdominal hysterectomy   . Oophorectomy   . Tonsillectomy   . Ras stents 2005    bilateral    History   Social History  . Marital Status: Widowed    Spouse Name: N/A    Number of Children: N/A  . Years of Education: N/A   Occupational History  . Not on file.   Social History Main Topics  . Smoking status: Never Smoker   . Smokeless tobacco: Never Used  . Alcohol Use: No  . Drug Use: No  . Sexually Active: No   Other Topics Concern  . Not on file   Social History Narrative   lives alone. I-ADL's. married 60 yrs, widowed May 28,'07. 2 miscarriages, 2 sons: '50, '53. 1 daughter: '48. 7 grandchildren    Family History  Problem Relation Age of Onset  . Heart disease Mother     Allergies as of 11/21/2010 - Review Complete 11/21/2010  Allergen Reaction Noted  . Augmentin  04/26/2010  . Avelox (moxifloxacin hcl in nacl)  04/26/2010  . Clonidine derivatives  09/28/2010  . Kdc:amoxicillin+saccharin+clavulanic acid+aspartame    . Moxifloxacin  04/06/2009  . Sulfa antibiotics  04/26/2010  . Sulfamethoxazole  08/07/2006  . Sulfonamide derivatives  07/28/2006    Current Outpatient Prescriptions on File Prior to Visit  Medication Sig Dispense Refill  . aspirin 81 MG tablet Take   81 mg by mouth daily.        . ferrous gluconate (FERGON) 246 (28 FE) MG tablet Take 1 tablet (246 mg total) by mouth 2 (two) times daily after a meal.  60 tablet  12  . fish oil-omega-3 fatty acids 1000 MG capsule Take 1 g by mouth 2 (two) times daily.       . furosemide (LASIX) 80 MG tablet Take 80 mg by mouth 2 (two) times daily.        . hydrALAZINE (APRESOLINE) 50 MG tablet TAKE 1 TABLET BY MOUTH 3 TIMES A DAY  90 tablet  6  . HYDROcodone-acetaminophen (NORCO) 5-325 MG per tablet Take 1 tablet by mouth at bedtime as needed. Take 1/2 tablet at bedtime as needed       . hydroxychloroquine (PLAQUENIL) 200 MG tablet Take 1 tablet by mouth daily.      .  levothyroxine (SYNTHROID, LEVOTHROID) 75 MCG tablet TAKE 1 TABLET BY MOUTH EVERY DAY  90 tablet  1  . lisinopril (PRINIVIL,ZESTRIL) 20 MG tablet Take 20 mg by mouth daily.        . metoprolol (LOPRESSOR) 50 MG tablet Take 1 tablet (50 mg total) by mouth 2 (two) times daily.  60 tablet  5  . Multiple Vitamin (MULTIVITAMIN) capsule Take 1 capsule by mouth 3 (three) times daily.       . omeprazole (PRILOSEC) 20 MG capsule Take 20 mg by mouth daily.        . predniSONE (DELTASONE) 5 MG tablet Take 5 mg by mouth daily as needed.        . Probiotic Product (PROBIOTIC PO) Take 1 tablet by mouth daily.        . ULORIC 40 MG tablet Take 1 tablet by mouth daily.      . amLODipine (NORVASC) 10 MG tablet TAKE 1 TABLET BY MOUTH EVERY DAY  30 tablet  6  . PLAVIX 75 MG tablet Take 1 tablet by mouth Daily.         REVIEW OF SYSTEMS: Positive for heartburn negative for chest pain or shortness of breath  PHYSICAL EXAMINATION: General: The patient appears their stated age.  Vital signs are BP 202/74  Pulse 63  Ht 5' 3" (1.6 m)  Wt 129 lb (58.514 kg)  BMI 22.85 kg/m2  SpO2 100% Pulmonary: There is a good air exchange bilaterally without wheezing or rales. Abdomen: Soft and non-tender Musculoskeletal: There are no major deformities.  There is no significant extremity pain. Neurologic: No focal weakness or paresthesias are detected, Skin: There are no ulcer or rashes noted. Psychiatric: The patient has normal affect. Cardiovascular: There is a regular rate and rhythm without significant murmur appreciated.   Diagnostic Studies Ultrasound was ordered and reviewed today this shows progressive stenosis within her superior mesenteric artery velocities in the proximal portion or 535/62. In the mid artery they are 259/32. There is a possible stenosis within the proximal right renal artery however this could not be very well evaluated  Assessment: Chronic mesenteric ischemia Plan: I discussed the findings  of the ultrasound with the patient today. I am somewhat concerned that she has again developed recurrent stenosis. I do feel with her having return of her symptoms that this needs to be addressed. I discussed proceeding with a covered stent at this time since she has not had good results with the regular bare-metal stenting. At schedule this procedure for next Tuesday, November 13. At that time I will likely restart   her Plavix.  V. Wells Brabham IV, M.D. [facility] Office: [phone]   

## 2010-11-22 ENCOUNTER — Other Ambulatory Visit: Payer: Self-pay | Admitting: *Deleted

## 2010-11-24 ENCOUNTER — Encounter (HOSPITAL_COMMUNITY): Payer: Self-pay | Admitting: Pharmacy Technician

## 2010-11-29 ENCOUNTER — Observation Stay (HOSPITAL_COMMUNITY)
Admission: RE | Admit: 2010-11-29 | Discharge: 2010-12-01 | DRG: 357 | Disposition: A | Discharge status: Home / Self Care | Payer: Medicare Other | Source: Ambulatory Visit | Attending: Surgery | Admitting: Surgery

## 2010-11-29 ENCOUNTER — Other Ambulatory Visit: Payer: Self-pay | Admitting: Surgery

## 2010-11-29 DIAGNOSIS — Y831 Surgical operation with implant of artificial internal device as the cause of abnormal reaction of the patient, or of later complication, without mention of misadventure at the time of the procedure: Secondary | ICD-10-CM | POA: Insufficient documentation

## 2010-11-29 DIAGNOSIS — I129 Hypertensive chronic kidney disease with stage 1 through stage 4 chronic kidney disease, or unspecified chronic kidney disease: Secondary | ICD-10-CM | POA: Insufficient documentation

## 2010-11-29 DIAGNOSIS — R1033 Periumbilical pain: Secondary | ICD-10-CM

## 2010-11-29 DIAGNOSIS — K551 Chronic vascular disorders of intestine: Secondary | ICD-10-CM

## 2010-11-29 DIAGNOSIS — I059 Rheumatic mitral valve disease, unspecified: Secondary | ICD-10-CM | POA: Insufficient documentation

## 2010-11-29 DIAGNOSIS — N189 Chronic kidney disease, unspecified: Secondary | ICD-10-CM | POA: Insufficient documentation

## 2010-11-29 DIAGNOSIS — I428 Other cardiomyopathies: Secondary | ICD-10-CM | POA: Insufficient documentation

## 2010-11-29 DIAGNOSIS — Z8673 Personal history of transient ischemic attack (TIA), and cerebral infarction without residual deficits: Secondary | ICD-10-CM | POA: Insufficient documentation

## 2010-11-29 DIAGNOSIS — E039 Hypothyroidism, unspecified: Secondary | ICD-10-CM | POA: Insufficient documentation

## 2010-11-29 DIAGNOSIS — I509 Heart failure, unspecified: Secondary | ICD-10-CM | POA: Insufficient documentation

## 2010-11-29 DIAGNOSIS — T82898A Other specified complication of vascular prosthetic devices, implants and grafts, initial encounter: Secondary | ICD-10-CM | POA: Insufficient documentation

## 2010-11-29 MED ORDER — METOPROLOL TARTRATE 1 MG/ML IV SOLN: 2.0000 mg | INTRAVENOUS | Status: DC | PRN

## 2010-11-29 MED ORDER — DOCUSATE SODIUM 100 MG PO CAPS
200.0000 mg | ORAL_CAPSULE | Freq: Every day | ORAL | Status: DC
  Administered 2010-11-30 – 2010-12-01 (×2): 200 mg via ORAL
  Filled 2010-11-29 (×2): qty 2

## 2010-11-29 MED ORDER — ONDANSETRON HCL 4 MG/2ML IJ SOLN: 4.0000 mg | Freq: Four times a day (QID) | INTRAMUSCULAR | Status: DC | PRN

## 2010-11-29 MED ORDER — SODIUM CHLORIDE 0.9 % IV SOLN: INTRAVENOUS | Status: DC

## 2010-11-29 MED ORDER — POLYETHYLENE GLYCOL 3350 17 G PO PACK
17.0000 g | PACK | Freq: Every day | ORAL | Status: DC | PRN
  Filled 2010-11-29: qty 1

## 2010-11-29 MED ORDER — POTASSIUM CHLORIDE CRYS ER 20 MEQ PO TBCR
20.0000 meq | EXTENDED_RELEASE_TABLET | Freq: Once | ORAL | Status: AC
  Administered 2010-11-30: 20 meq via ORAL

## 2010-11-29 MED ORDER — BISACODYL 10 MG RE SUPP: 10.0000 mg | Freq: Every day | RECTAL | Status: DC | PRN

## 2010-11-29 MED ORDER — SODIUM BICARBONATE 8.4 % IV SOLN
INTRAVENOUS | Status: AC
  Administered 2010-11-30: 11:00:00 via INTRAVENOUS
  Filled 2010-11-29: qty 1000

## 2010-11-29 MED ORDER — SODIUM BICARBONATE 8.4 % IV SOLN
INTRAVENOUS | Status: AC
  Administered 2010-11-30: 12:00:00 via INTRAVENOUS
  Filled 2010-11-29: qty 1000

## 2010-11-29 MED ORDER — SODIUM CHLORIDE 0.9 % IV SOLN
INTRAVENOUS | Status: DC
  Administered 2010-11-29 – 2010-11-30 (×2): via INTRAVENOUS

## 2010-11-29 MED ORDER — PHENOL 1.4 % MT LIQD
1.0000 | OROMUCOSAL | Status: DC | PRN
  Filled 2010-11-29: qty 177

## 2010-11-29 MED ORDER — MAGNESIUM HYDROXIDE 400 MG/5ML PO SUSP: 30.0000 mL | ORAL | Status: DC | PRN

## 2010-11-29 MED ORDER — POTASSIUM CHLORIDE CRYS ER 20 MEQ PO TBCR: 20.0000 meq | EXTENDED_RELEASE_TABLET | Freq: Once | ORAL | Status: DC

## 2010-11-29 MED ORDER — FAMOTIDINE IN NACL 20-0.9 MG/50ML-% IV SOLN
20.0000 mg | Freq: Two times a day (BID) | INTRAVENOUS | Status: DC
  Administered 2010-11-29 – 2010-11-30 (×2): 20 mg via INTRAVENOUS
  Filled 2010-11-29 (×5): qty 50

## 2010-11-29 MED ORDER — ALUM & MAG HYDROXIDE-SIMETH 200-200-20 MG/5ML PO SUSP: 15.0000 mL | ORAL | Status: DC | PRN

## 2010-11-29 MED ORDER — GUAIFENESIN-DM 100-10 MG/5ML PO SYRP: 15.0000 mL | ORAL_SOLUTION | ORAL | Status: DC | PRN

## 2010-11-29 MED ORDER — HYDRALAZINE HCL 20 MG/ML IJ SOLN
10.0000 mg | INTRAMUSCULAR | Status: DC | PRN
  Filled 2010-11-29: qty 0.5

## 2010-11-29 MED ORDER — LABETALOL HCL 5 MG/ML IV SOLN
10.0000 mg | INTRAVENOUS | Status: DC | PRN
  Filled 2010-11-29: qty 4

## 2010-11-29 NOTE — Procedures (Unsigned)
RENAL ARTERY DUPLEX EVALUATION  INDICATION:  Renal artery stenosis and abdominal pain.  HISTORY: Diabetes:  No. Cardiac:  Yes, CAD. Hypertension:  Yes. Smoking:  No.  RENAL ARTERY DUPLEX FINDINGS: Aorta-Proximal:  127 cm/s Aorta-Mid:  228 cm/s Aorta-Distal:  177 cm/s Celiac Artery Origin:  310 cm/s SMA Origin:  394/51 cm/s                                   RIGHT               LEFT Renal Artery Origin:             193 cm/s            205 cm/s Renal Artery Proximal:           281 cm/s            342 cm/s Renal Artery Mid:                130 cm/s            120 Renal Artery Distal:             193 cm/s            87 cm/s Hilar Acceleration Time (AT): Renal-Aortic Ratio (RAR):        Unreliable          Unreliable Kidney Size:                     11.7 cm             9.4 cm End Diastolic Ratio (EDR): Resistive Index (RI):            0.77/0.84           0.87/0.78  IMPRESSION: 1. Dense calcified abdominal aorta with velocities over 100 cm/s,     making renal aortic ratio unreliable. 2. Bilateral renal artery resistive indices are >0.75 and considered     abnormal. 3. Right renal artery proximally presents with a peak systolic     velocity of 281 cm/s, suggestive of stenosis; however, unable to     adequately visualize the renal artery stent. 4. Left renal artery proximal/mid segment presents with a peak     systolic velocity of 342 cm/s, suggestive of stenosis.  Also unable     to visualize renal artery stent.  ___________________________________________ V. Charlena Cross, MD  SH/MEDQ  D:  11/21/2010  T:  11/21/2010  Job:  [id number]

## 2010-11-29 NOTE — Procedures (Unsigned)
MESENTERIC ARTERIAL DUPLEX EVALUATION  INDICATION:  Abdominal pain.  HISTORY: Diabetes:  No. Cardiac:  Yes, CAD. Hypertension:  Yes. Smoking:  No.  Mesenteric Duplex Findings: Aorta - Proximal                            127 Aorta - Mid Aorta - Distal  Celiac Trunk - Proximal                     310 Celiac Trunk - Distal  Hepatic Artery                              91 Splenic Artery                              194  Superior Mesenteric Artery-Origin           394/51 Superior Mesenteric Artery-Proximal         535/62 Superior Mesenteric Artery-Mid              259/32 Superior Mesenteric Artery-Distal  Inferior Mesenteric Artery-Proximal         520/108  IMPRESSION: 1. Dense calcified abdominal aorta with velocities over 100 cm/s,     making ratios unreliable. 2. Superior mesenteric artery stenosis >75% in the proximal segment     with a peak systolic velocity of 535 cm/s and end diastole of 62     cm/s. 3. Inferior mesenteric artery stenosis >75% at the origin with a peak     systolic velocity of 520 cm/s and an end diastolic of 108 cm/s. 4. Celiac access proximally demonstrates a >75% stenosis with a peak     systolic velocity of 310 cm/s.  ___________________________________________ V. Charlena Cross, MD  SH/MEDQ  D:  11/21/2010  T:  11/21/2010  Job:  [id number]

## 2010-11-29 NOTE — Interval H&P Note (Signed)
History and Physical Interval Note:   11/29/2010   8:58 AM   Elizabeth Wall  has presented today for surgery, with the diagnosis of Mesenteric stenosis  The various methods of treatment have been discussed with the patient and family. After consideration of risks, benefits and other options for treatment, the patient has consented to  Procedure(s): VISCERAL ANGIOGRAM with possible intervention as a surgical intervention .  [name] history has been reviewed, patient examined, no change in status, stable for surgery.  I have reviewed the patients' chart and labs.  Questions were answered to the patient's satisfaction.     Jorge Ny  MD

## 2010-11-29 NOTE — H&P (View-Only) (Signed)
[facility]   Patient name: Elizabeth Wall MRN: [id number] DOB: 09-21-1919 Sex: female     Chief Complaint  Patient presents with  . Mesenteric Insufficiency    3 month follow up, Pt c/o of heartburn pain.    HISTORY OF PRESENT ILLNESS: The patient is back today for continued followup of her chronic mesenteric ischemia. She underwent superior mesenteric artery stenting on 01/26/2010 with a dramatic improvement in her ability to tolerate food. On her followup surveillance ultrasound she was found to have recurrent stenosis. Therefore on April 24 she went back for repeat procedure. At this time she was found to have recurrent in-stent stenosis. I attempted balloon angioplasty however I did not get the desired results and therefore the lesion was resuspended this time using a 6 x 12 Herculink stent that was deployed approximately 2 mm more proximal to her previous stent. She was closed using a proglide. Her ultrasound 3 months ago showed elevated velocities again within her superior mesenteric artery. Because she was asymptomatic at that time I elected to continue with surveillance. She is back today for repeat imaging. She states that for the past week she is now having mild symptoms of heartburn with eating. She is taking her Beano again.  Past Medical History  Diagnosis Date  . Chronic kidney disease     chronic  . Cardiomyopathy     EF 40%. likely tachycardia induced  . Dyspepsia   . Peripheral vascular disease   . 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 Thyroid disease     hypothyroidism  . Hypertension   . Renovascular hypertension     s/p stenting  . CHF (congestive heart failure)     EF 40%  . Arthritis   . CVA (cerebral vascular accident) 06/2005  . Mitral regurgitation   . Hiatal hernia   . Atrial flutter March 2011    s/p ablation     Past Surgical History  Procedure Date  . Cardiac catheterization   .  Cholecystectomy     laporascopic  . Abdominal hysterectomy   . Oophorectomy   . Tonsillectomy   . Ras stents 2005    bilateral    History   Social History  . Marital Status: Widowed    Spouse Name: N/A    Number of Children: N/A  . Years of Education: N/A   Occupational History  . Not on file.   Social History Main Topics  . Smoking status: Never Smoker   . Smokeless tobacco: Never Used  . Alcohol Use: No  . Drug Use: No  . Sexually Active: No   Other Topics Concern  . Not on file   Social History Narrative   lives alone. I-ADL's. married 60 yrs, widowed May 28,'07. 2 miscarriages, 2 sons: '50, '53. 1 daughter: '48. 7 grandchildren    Family History  Problem Relation Age of Onset  . Heart disease Mother     Allergies as of 11/21/2010 - Review Complete 11/21/2010  Allergen Reaction Noted  . Augmentin  04/26/2010  . Avelox (moxifloxacin hcl in nacl)  04/26/2010  . Clonidine derivatives  09/28/2010  . UUV:[id number]+QQVZDGLOV+FIEPPIRJJO acid+aspartame    . Moxifloxacin  04/06/2009  . Sulfa antibiotics  04/26/2010  . Sulfamethoxazole  08/07/2006  . Sulfonamide derivatives  07/28/2006    Current Outpatient Prescriptions on File Prior to Visit  Medication Sig Dispense Refill  . aspirin 81 MG tablet Take  81 mg by mouth daily.        . ferrous gluconate (FERGON) 246 (28 FE) MG tablet Take 1 tablet (246 mg total) by mouth 2 (two) times daily after a meal.  60 tablet  12  . fish oil-omega-3 fatty acids 1000 MG capsule Take 1 g by mouth 2 (two) times daily.       . furosemide (LASIX) 80 MG tablet Take 80 mg by mouth 2 (two) times daily.        . hydrALAZINE (APRESOLINE) 50 MG tablet TAKE 1 TABLET BY MOUTH 3 TIMES A DAY  90 tablet  6  . HYDROcodone-acetaminophen (NORCO) 5-325 MG per tablet Take 1 tablet by mouth at bedtime as needed. Take 1/2 tablet at bedtime as needed       . hydroxychloroquine (PLAQUENIL) 200 MG tablet Take 1 tablet by mouth daily.      Marland Kitchen  levothyroxine (SYNTHROID, LEVOTHROID) 75 MCG tablet TAKE 1 TABLET BY MOUTH EVERY DAY  90 tablet  1  . lisinopril (PRINIVIL,ZESTRIL) 20 MG tablet Take 20 mg by mouth daily.        . metoprolol (LOPRESSOR) 50 MG tablet Take 1 tablet (50 mg total) by mouth 2 (two) times daily.  60 tablet  5  . Multiple Vitamin (MULTIVITAMIN) capsule Take 1 capsule by mouth 3 (three) times daily.       Marland Kitchen omeprazole (PRILOSEC) 20 MG capsule Take 20 mg by mouth daily.        . predniSONE (DELTASONE) 5 MG tablet Take 5 mg by mouth daily as needed.        . Probiotic Product (PROBIOTIC PO) Take 1 tablet by mouth daily.        Marland Kitchen ULORIC 40 MG tablet Take 1 tablet by mouth daily.      Marland Kitchen amLODipine (NORVASC) 10 MG tablet TAKE 1 TABLET BY MOUTH EVERY DAY  30 tablet  6  . PLAVIX 75 MG tablet Take 1 tablet by mouth Daily.         REVIEW OF SYSTEMS: Positive for heartburn negative for chest pain or shortness of breath  PHYSICAL EXAMINATION: General: The patient appears their stated age.  Vital signs are BP 202/74  Pulse 63  Ht 5\' 3"  (1.6 m)  Wt 129 lb (58.514 kg)  BMI 22.85 kg/m2  SpO2 100% Pulmonary: There is a good air exchange bilaterally without wheezing or rales. Abdomen: Soft and non-tender Musculoskeletal: There are no major deformities.  There is no significant extremity pain. Neurologic: No focal weakness or paresthesias are detected, Skin: There are no ulcer or rashes noted. Psychiatric: The patient has normal affect. Cardiovascular: There is a regular rate and rhythm without significant murmur appreciated.   Diagnostic Studies Ultrasound was ordered and reviewed today this shows progressive stenosis within her superior mesenteric artery velocities in the proximal portion or 535/62. In the mid artery they are 259/32. There is a possible stenosis within the proximal right renal artery however this could not be very well evaluated  Assessment: Chronic mesenteric ischemia Plan: I discussed the findings  of the ultrasound with the patient today. I am somewhat concerned that she has again developed recurrent stenosis. I do feel with her having return of her symptoms that this needs to be addressed. I discussed proceeding with a covered stent at this time since she has not had good results with the regular bare-metal stenting. At schedule this procedure for next Tuesday, November 13. At that time I will likely restart  her Plavix.  Jorge Ny, M.D. [facility] Office: [phone]

## 2010-11-29 NOTE — H&P (Signed)
Patient name: Elizabeth Wall MRN: [id number] DOB: [birth year] Sex: female  Chief Complaint   Patient presents with   .  Mesenteric Insufficiency     3 month follow up, Pt c/o of heartburn pain.    HISTORY OF PRESENT ILLNESS:  The patient is back today for continued followup of her chronic mesenteric ischemia. She underwent superior mesenteric artery stenting on 01/26/2010 with a dramatic improvement in her ability to tolerate food. On her followup surveillance ultrasound she was found to have recurrent stenosis. Therefore on April 24 she went back for repeat procedure. At this time she was found to have recurrent in-stent stenosis. I attempted balloon angioplasty however I did not get the desired results and therefore the lesion was resuspended this time using a 6 x 12 Herculink stent that was deployed approximately 2 mm more proximal to her previous stent. She was closed using a proglide. Her ultrasound 3 months ago showed elevated velocities again within her superior mesenteric artery. Because she was asymptomatic at that time I elected to continue with surveillance. She is back today for repeat imaging. She states that for the past week she is now having mild symptoms of heartburn with eating. She is taking her Beano again.  Past Medical History   Diagnosis  Date   .  Chronic kidney disease      chronic   .  Cardiomyopathy      EF 40%. likely tachycardia induced   .  Dyspepsia    .  Peripheral vascular disease    .  Mesenteric ischemia      due to stenosis of superior mesenteric artery   .  Thyroid disease      hypothyroidism   .  Hypertension    .  Renovascular hypertension      s/p stenting   .  CHF (congestive heart failure)      EF 40%   .  Arthritis    .  CVA (cerebral vascular accident)  06/2005   .  Mitral regurgitation    .  Hiatal hernia    .  Atrial flutter  March 2011     s/p ablation    Past Surgical History   Procedure  Date   .  Cardiac catheterization    .   Cholecystectomy      laporascopic   .  Abdominal hysterectomy    .  Oophorectomy    .  Tonsillectomy    .  Ras stents  2005     bilateral    History    Social History   .  Marital Status:  Widowed     Spouse Name:  N/A     Number of Children:  N/A   .  Years of Education:  N/A    Occupational History   .  Not on file.    Social History Main Topics   .  Smoking status:  Never Smoker   .  Smokeless tobacco:  Never Used   .  Alcohol Use:  No   .  Drug Use:  No   .  Sexually Active:  No    Other Topics  Concern   .  Not on file    Social History Narrative    lives alone. I-ADL's. married 60 yrs, widowed May 28,'07. 2 miscarriages, 2 sons: '50, '53. 1 daughter: '48. 7 grandchildren    Family History   Problem  Relation  Age of Onset   .  Heart  disease  Mother     Allergies as of 11/21/2010 - Review Complete 11/21/2010   Allergen  Reaction  Noted   .  Augmentin   04/26/2010   .  Avelox (moxifloxacin hcl in nacl)   04/26/2010   .  Clonidine derivatives   09/28/2010   .  ZOX:WRUEAVWUJWJ+XBJYNWGNF+AOZHYQMVHQ acid+aspartame     .  Moxifloxacin   04/06/2009   .  Sulfa antibiotics   04/26/2010   .  Sulfamethoxazole   08/07/2006   .  Sulfonamide derivatives   07/28/2006    Current Outpatient Prescriptions on File Prior to Visit   Medication  Sig  Dispense  Refill   .  aspirin 81 MG tablet  Take 81 mg by mouth daily.     .  ferrous gluconate (FERGON) 246 (28 FE) MG tablet  Take 1 tablet (246 mg total) by mouth 2 (two) times daily after a meal.  60 tablet  12   .  fish oil-omega-3 fatty acids 1000 MG capsule  Take 1 g by mouth 2 (two) times daily.     .  furosemide (LASIX) 80 MG tablet  Take 80 mg by mouth 2 (two) times daily.     .  hydrALAZINE (APRESOLINE) 50 MG tablet  TAKE 1 TABLET BY MOUTH 3 TIMES A DAY  90 tablet  6   .  HYDROcodone-acetaminophen (NORCO) 5-325 MG per tablet  Take 1 tablet by mouth at bedtime as needed. Take 1/2 tablet at bedtime as needed     .   hydroxychloroquine (PLAQUENIL) 200 MG tablet  Take 1 tablet by mouth daily.     Marland Kitchen  levothyroxine (SYNTHROID, LEVOTHROID) 75 MCG tablet  TAKE 1 TABLET BY MOUTH EVERY DAY  90 tablet  1   .  lisinopril (PRINIVIL,ZESTRIL) 20 MG tablet  Take 20 mg by mouth daily.     .  metoprolol (LOPRESSOR) 50 MG tablet  Take 1 tablet (50 mg total) by mouth 2 (two) times daily.  60 tablet  5   .  Multiple Vitamin (MULTIVITAMIN) capsule  Take 1 capsule by mouth 3 (three) times daily.     Marland Kitchen  omeprazole (PRILOSEC) 20 MG capsule  Take 20 mg by mouth daily.     .  predniSONE (DELTASONE) 5 MG tablet  Take 5 mg by mouth daily as needed.     .  Probiotic Product (PROBIOTIC PO)  Take 1 tablet by mouth daily.     Marland Kitchen  ULORIC 40 MG tablet  Take 1 tablet by mouth daily.     Marland Kitchen  amLODipine (NORVASC) 10 MG tablet  TAKE 1 TABLET BY MOUTH EVERY DAY  30 tablet  6   .  PLAVIX 75 MG tablet  Take 1 tablet by mouth Daily.      REVIEW OF SYSTEMS:  Positive for heartburn negative for chest pain or shortness of breath  PHYSICAL EXAMINATION:  General: The patient appears their stated age. Vital signs are BP 202/74  Pulse 63  Ht 5\' 3"  (1.6 m)  Wt 129 lb (58.514 kg)  BMI 22.85 kg/m2  SpO2 100%  Pulmonary: There is a good air exchange bilaterally without wheezing or rales.  Abdomen: Soft and non-tender  Musculoskeletal: There are no major deformities. There is no significant extremity pain.  Neurologic: No focal weakness or paresthesias are detected,  Skin: There are no ulcer or rashes noted.  Psychiatric: The patient has normal affect.  Cardiovascular: There is a regular rate and rhythm without  significant murmur appreciated.  Diagnostic Studies  Ultrasound was ordered and reviewed today this shows progressive stenosis within her superior mesenteric artery velocities in the proximal portion or 535/62. In the mid artery they are 259/32. There is a possible stenosis within the proximal right renal artery however this could not be very  well evaluated  Assessment:  Chronic mesenteric ischemia  Plan:  I discussed the findings of the ultrasound with the patient today. I am somewhat concerned that she has again developed recurrent stenosis. I do feel with her having return of her symptoms that this needs to be addressed. I discussed proceeding with a covered stent at this time since she has not had good results with the regular bare-metal stenting. At schedule this procedure for next Tuesday, November 13. At that time I will likely restart her Plavix.   Pt's creatinine is 2.3 today.  Her procedure is cancelled.  We will admit to the hospital and hydrate the pt.  We will use the bicarb protocol for her procedure.  Newton Pigg, PA-C for V. Charlena Cross, M.D.  [facility]  Office: [phone]  I agree with the above note and findings.  Case discussed via telephone with her son.

## 2010-11-30 ENCOUNTER — Other Ambulatory Visit: Payer: Self-pay | Admitting: Vascular Surgery

## 2010-11-30 ENCOUNTER — Other Ambulatory Visit: Payer: Self-pay

## 2010-11-30 ENCOUNTER — Inpatient Hospital Stay (HOSPITAL_COMMUNITY)
Admission: RE | Disposition: A | Discharge status: Home / Self Care | Payer: Self-pay | Source: Ambulatory Visit | Attending: Surgery

## 2010-11-30 HISTORY — PX: VISCERAL ANGIOGRAM: SHX5515

## 2010-11-30 HISTORY — PX: ABDOMINAL ANGIOGRAM: SHX5499

## 2010-11-30 LAB — COMPREHENSIVE METABOLIC PANEL
ALT: 10 U/L (ref 0–35)
Alkaline Phosphatase: 54 U/L (ref 39–117)
BUN: 81 mg/dL — ABNORMAL HIGH (ref 6–23)
CO2: 23 mEq/L (ref 19–32)
Chloride: 106 mEq/L (ref 96–112)
GFR calc Af Amer: 29 mL/min — ABNORMAL LOW (ref >90)
GFR calc non Af Amer: 25 mL/min — ABNORMAL LOW (ref >90)
Glucose, Bld: 104 mg/dL — ABNORMAL HIGH (ref 70–99)
Potassium: 3.7 mEq/L (ref 3.5–5.1)
Sodium: 140 mEq/L (ref 135–145)
Total Bilirubin: 0.2 mg/dL — ABNORMAL LOW (ref 0.3–1.2)

## 2010-11-30 LAB — POCT I-STAT, CHEM 8
BUN: 93 mg/dL — ABNORMAL HIGH (ref 6–23)
Calcium, Ion: 1.11 mmol/L — ABNORMAL LOW (ref 1.12–1.32)
HCT: 41 % (ref 36.0–46.0)
Hemoglobin: 13.9 g/dL (ref 12.0–15.0)
Sodium: 138 mEq/L (ref 135–145)
TCO2: 22 mmol/L (ref 0–100)

## 2010-11-30 LAB — CBC
HCT: 35 % — ABNORMAL LOW (ref 36.0–46.0)
Hemoglobin: 11.6 g/dL — ABNORMAL LOW (ref 12.0–15.0)
RBC: 3.86 MIL/uL — ABNORMAL LOW (ref 3.87–5.11)

## 2010-11-30 SURGERY — VISCERAL ANGIOGRAM
Anesthesia: LOCAL

## 2010-11-30 MED ORDER — LIDOCAINE HCL (PF) 1 % IJ SOLN
INTRAMUSCULAR | Status: AC
  Filled 2010-11-30: qty 30

## 2010-11-30 MED ORDER — LEVOTHYROXINE SODIUM 75 MCG PO TABS
75.0000 ug | ORAL_TABLET | Freq: Every day | ORAL | Status: DC
  Administered 2010-11-30 – 2010-12-01 (×2): 75 ug via ORAL
  Filled 2010-11-30 (×3): qty 1

## 2010-11-30 MED ORDER — CLOPIDOGREL BISULFATE 75 MG PO TABS
75.0000 mg | ORAL_TABLET | Freq: Every day | ORAL | Status: DC
  Administered 2010-12-01: 75 mg via ORAL
  Filled 2010-11-30 (×2): qty 1

## 2010-11-30 MED ORDER — OMEGA-3-ACID ETHYL ESTERS 1 G PO CAPS
1.0000 g | ORAL_CAPSULE | Freq: Two times a day (BID) | ORAL | Status: DC
  Administered 2010-11-30 – 2010-12-01 (×3): 1 g via ORAL
  Filled 2010-11-30 (×5): qty 1

## 2010-11-30 MED ORDER — HYDROCODONE-ACETAMINOPHEN 5-325 MG PO TABS
1.0000 | ORAL_TABLET | Freq: Four times a day (QID) | ORAL | Status: DC | PRN
  Administered 2010-11-30 (×2): 1 via ORAL
  Filled 2010-11-30 (×2): qty 1

## 2010-11-30 MED ORDER — ASPIRIN 81 MG PO CHEW
81.0000 mg | CHEWABLE_TABLET | Freq: Every day | ORAL | Status: DC
  Administered 2010-11-30 – 2010-12-01 (×2): 81 mg via ORAL
  Filled 2010-11-30 (×2): qty 1

## 2010-11-30 MED ORDER — HYDRALAZINE HCL 10 MG PO TABS
10.0000 mg | ORAL_TABLET | Freq: Three times a day (TID) | ORAL | Status: DC
  Administered 2010-11-30 (×3): 10 mg via ORAL
  Filled 2010-11-30 (×8): qty 1

## 2010-11-30 MED ORDER — FERROUS GLUCONATE IRON 246 (28 FE) MG PO TABS: 246.0000 mg | ORAL_TABLET | Freq: Two times a day (BID) | ORAL | Status: DC

## 2010-11-30 MED ORDER — HEPARIN (PORCINE) IN NACL 2-0.9 UNIT/ML-% IJ SOLN
INTRAMUSCULAR | Status: AC
  Filled 2010-11-30: qty 1000

## 2010-11-30 MED ORDER — ASPIRIN 81 MG PO TABS: 81.0000 mg | ORAL_TABLET | Freq: Every day | ORAL | Status: DC

## 2010-11-30 MED ORDER — HEPARIN SODIUM (PORCINE) 1000 UNIT/ML IJ SOLN
INTRAMUSCULAR | Status: AC
  Filled 2010-11-30: qty 1

## 2010-11-30 MED ORDER — THERA M PLUS PO TABS
1.0000 | ORAL_TABLET | Freq: Every day | ORAL | Status: DC
  Administered 2010-11-30 – 2010-12-01 (×2): 1 via ORAL
  Filled 2010-11-30 (×2): qty 1

## 2010-11-30 MED ORDER — OMEGA-3 FATTY ACIDS 1000 MG PO CAPS: 1.0000 g | ORAL_CAPSULE | Freq: Two times a day (BID) | ORAL | Status: DC

## 2010-11-30 MED ORDER — HYDROXYCHLOROQUINE SULFATE 200 MG PO TABS
200.0000 mg | ORAL_TABLET | Freq: Every day | ORAL | Status: DC
Start: 2010-11-30 — End: 2010-12-01
  Administered 2010-11-30 – 2010-12-01 (×2): 200 mg via ORAL
  Filled 2010-11-30 (×2): qty 1

## 2010-11-30 MED ORDER — FERROUS GLUCONATE 324 (38 FE) MG PO TABS
324.0000 mg | ORAL_TABLET | Freq: Two times a day (BID) | ORAL | Status: DC
  Administered 2010-11-30 – 2010-12-01 (×3): 324 mg via ORAL
  Filled 2010-11-30 (×5): qty 1

## 2010-11-30 MED ORDER — MULTIVITAMINS PO CAPS: 1.0000 | ORAL_CAPSULE | Freq: Every day | ORAL | Status: DC

## 2010-11-30 MED ORDER — METOPROLOL TARTRATE 50 MG PO TABS
50.0000 mg | ORAL_TABLET | Freq: Two times a day (BID) | ORAL | Status: DC
  Administered 2010-11-30 – 2010-12-01 (×4): 50 mg via ORAL
  Filled 2010-11-30 (×5): qty 1

## 2010-11-30 MED ORDER — PANTOPRAZOLE SODIUM 40 MG PO TBEC: 40.0000 mg | DELAYED_RELEASE_TABLET | Freq: Every day | ORAL | Status: DC

## 2010-11-30 MED ORDER — AMLODIPINE BESYLATE 10 MG PO TABS
10.0000 mg | ORAL_TABLET | Freq: Every day | ORAL | Status: DC
  Administered 2010-11-30 – 2010-12-01 (×2): 10 mg via ORAL
  Filled 2010-11-30 (×2): qty 1

## 2010-11-30 MED ORDER — FEBUXOSTAT 40 MG PO TABS
40.0000 mg | ORAL_TABLET | Freq: Every day | ORAL | Status: DC
  Administered 2010-11-30 – 2010-12-01 (×2): 40 mg via ORAL
  Filled 2010-11-30 (×2): qty 1

## 2010-11-30 MED ORDER — SODIUM BICARBONATE 8.4 % IV SOLN
INTRAVENOUS | Status: DC
  Filled 2010-11-30: qty 1000

## 2010-11-30 MED ORDER — ACETAMINOPHEN 325 MG PO TABS: 650.0000 mg | ORAL_TABLET | ORAL | Status: DC | PRN

## 2010-11-30 NOTE — Progress Notes (Signed)
The patient was scheduled to undergo imaging and possible re\re stenting of her superior mesenteric artery due to her symptoms of worsening chronic mesenteric ischemia. However, on arrival yesterday her creatinine was found to be elevated at 2.3 therefore she was admitted for hydration. I did discuss this with the patient and her son. Her creatinine is down to 1.7 today and therefore I believe we can proceed with her angiogram later on this morning. She will be monitored after her procedure overnight to ensure that there are no changes in her renal profile.

## 2010-11-30 NOTE — Op Note (Signed)
[facility]  Patient name: AYIANA WINSLETT MRN: [id number] DOB: [birth year] Sex: female  11/29/2010 - 11/30/2010 Pre-operative Diagnosis: Chronic mesenteric ischemia, in stent stenosis Post-operative diagnosis:  Same Surgeon:  Jorge Ny Procedure Performed:  1.  ultrasound access right common femoral artery  2.  first order catheterization (superior mesenteric artery  3.  superior mesenteric artery angiogram  4.  stent, superior mesenteric artery (atrium a 6 x 22)     Indications:  The patient has a history of chronic mesenteric ischemia. She initially underwent stenting in January which alleviated her symptoms. She was found to have a early recurrence of in-stent stenosis and has undergone dilation however this was without satisfactory result and a second stent was placed. Over the past month the patient has had recurrent symptoms. Ultrasound confirmed elevated velocities within her stent. She comes in today for further evaluation and possible intervention with plans for a covered stent.  Procedure:  The patient was identified in the holding area and taken to room 8.  The patient was then placed supine on the table and prepped and draped in the usual sterile fashion.  A time out was called.  Ultrasound was used to evaluate the right common femoral artery.  It was patent .  A digital ultrasound image was acquired. An 18-gauge needle was used to access the right common femoral artery under ultrasound guidance.  An 035 wire was advanced without resistance and a 7 French antral 1 sheath was placed.    An omniflush catheter was advanced over the wire to the level of L-1.  An abdominal angiogram was obtained in the lateral position.  Next using the Omni flush catheter and a Benson wire the superior mesenteric artery stent was selected. A wire exchange was performed and a Rosen wire was placed. Next the dilator was reinserted into the sheath and the sheath was  advanced into the stent. Superior mesenteric artery and skin was performed with the sheath in the superior mesenteric artery stent. It is at this point that I heparinized the patient.   Findings:   Aortogram:  The celiac artery appears to be patent with moderate stenosis. The superior mesentery artery stent is patent. There are filling defects within the stent. The supraceliac aorta is widely patent. Bilateral renal stents are patent. Images were not performed to evaluate there percent stenosis.   Intervention:  At this point the decision was made to intervene. I selected a atrium 6 x 22 balloon expandable covered stent. This is apparently back table. The balloon expandable stent was then advanced through the sheath into the previously deployed stent. It was taken to 8 atmospheres. Followup study was performed which revealed some residual narrowing within the midportion of the stent. I therefore selected a 6 mm balloon and repeated balloon angioplasty of the stent. Completion study was performed which showed a widely patent superior mesenteric artery with its associated stent. This point the decision was made to terminate the procedure. Catheters and wires were removed the right groin was closed using a prophylactic device. Patient tolerated the procedure well there were no complications  Impression:  #1  successful stenting of the superior mesenteric artery using a 6 x 22 atrium stent    Myra Gianotti IV, Lala Lund [facility] Office: [phone] Pager:  [phone]

## 2010-11-30 NOTE — Interval H&P Note (Signed)
History and Physical Interval Note:   11/30/2010   7:50 AM   Elizabeth Wall  has presented today for surgery, with the diagnosis of Mesenteric stenosis  The various methods of treatment have been discussed with the patient and family. After consideration of risks, benefits and other options for treatment, the patient has consented to  Procedure(s): VISCERAL ANGIOGRAM as a surgical intervention .  [name] history has been reviewed, patient examined, no change in status, stable for surgery.  I have reviewed the patients' chart and labs.  Questions were answered to the patient's satisfaction.     Jorge Ny  MD  The patient was kept overnight for hydration her creatinine is down to 1.7 today we will proceed with her mesenteric angiogram and possible intervention today.

## 2010-12-01 ENCOUNTER — Encounter (HOSPITAL_COMMUNITY): Payer: Self-pay

## 2010-12-01 LAB — BASIC METABOLIC PANEL
BUN: 52 mg/dL — ABNORMAL HIGH (ref 6–23)
CO2: 26 mEq/L (ref 19–32)
Calcium: 8 mg/dL — ABNORMAL LOW (ref 8.4–10.5)
Chloride: 111 mEq/L (ref 96–112)
Creatinine, Ser: 1.2 mg/dL — ABNORMAL HIGH (ref 0.50–1.10)
Glucose, Bld: 96 mg/dL (ref 70–99)

## 2010-12-01 LAB — CBC
HCT: 33.5 % — ABNORMAL LOW (ref 36.0–46.0)
MCH: 30 pg (ref 26.0–34.0)
MCV: 91.3 fL (ref 78.0–100.0)
Platelets: 278 10*3/uL (ref 150–400)
RDW: 14 % (ref 11.5–15.5)

## 2010-12-01 MED ORDER — FUROSEMIDE 80 MG PO TABS: 80.0000 mg | ORAL_TABLET | Freq: Two times a day (BID) | ORAL | Status: DC

## 2010-12-01 MED ORDER — PREDNISONE 5 MG PO TABS: 5.0000 mg | ORAL_TABLET | Freq: Every day | ORAL | Status: DC | PRN

## 2010-12-01 MED ORDER — CLOPIDOGREL BISULFATE 75 MG PO TABS: 75.0000 mg | ORAL_TABLET | Freq: Every day | ORAL | Status: AC

## 2010-12-01 MED ORDER — LISINOPRIL 20 MG PO TABS: 20.0000 mg | ORAL_TABLET | Freq: Every day | ORAL | Status: DC

## 2010-12-01 NOTE — Progress Notes (Signed)
Utilization review completed. Won Kreuzer, RN, BSN. 12/01/10  

## 2010-12-01 NOTE — Discharge Summary (Signed)
Physician Discharge Summary  Patient ID: Elizabeth Wall MRN: [id number] DOB/AGE: [birth year] 75 y.o.  Admit date: 11/29/2010 Discharge date: 12/01/2010  Admission Diagnosis: Mesenteric stenosis Discharge Diagnoses:  same  Secondary Diagnoses: Active Problems:  * No active hospital problems. *    Procedures: Angiogram with SMA stent  Discharged Condition: stable  Hospital Course:  Admitted for elevated creatinine which responded to hydration.  She underwent stenting of her SMA.  She was kept afterwards for hydration.  Her Creatinine was 1.2 on the day of discharge.  She was able to eat with out pain.  She was started on plavix. Consults:    none Significant Diagnostic Studies: CBC    Component Value Date/Time   WBC 4.8 12/01/2010 0515   RBC 3.67* 12/01/2010 0515   HGB 11.0* 12/01/2010 0515   HCT 33.5* 12/01/2010 0515   PLT 278 12/01/2010 0515   MCV 91.3 12/01/2010 0515   MCH 30.0 12/01/2010 0515   MCHC 32.8 12/01/2010 0515   RDW 14.0 12/01/2010 0515   LYMPHSABS 1.1 03/13/2009 1258   MONOABS 0.5 03/13/2009 1258   EOSABS 0.0 03/13/2009 1258   BASOSABS 0.2* 03/13/2009 1258    BMET    Component Value Date/Time   NA 144 12/01/2010 0515   K 3.9 12/01/2010 0515   CL 111 12/01/2010 0515   CO2 26 12/01/2010 0515   GLUCOSE 96 12/01/2010 0515   BUN 52* 12/01/2010 0515   CREATININE 1.20* 12/01/2010 0515   CALCIUM 8.0* 12/01/2010 0515   GFRNONAA 39* 12/01/2010 0515   GFRAA 45* 12/01/2010 0515    COAG Lab Results  Component Value Date   INR 1.20 02/08/2010   INR 2.6 01/24/2010   INR 1.9 01/21/2010   No results found for this basename: PTT    Disposition: Home or Self Care  Discharge Orders    Future Orders Please Complete By Expires   Resume previous diet      Lifting restrictions      Comments:   No lifting for 1 weeks   Call MD for:  temperature >100.5      Call MD for:  redness, tenderness, or signs of infection (pain, swelling, bleeding, redness, odor or  green/yellow discharge around incision site)      Call MD for:  severe or increased pain, loss or decreased feeling  in affected limb(s)      Activity as tolerated - No restrictions      May shower       May walk up steps      Increase activity slowly      Comments:   Walk with assistance use walker or cane as needed     Current Discharge Medication List    START taking these medications   Details  clopidogrel (PLAVIX) 75 MG tablet Take 1 tablet (75 mg total) by mouth daily. Qty: 30 tablet, Refills: 11      CONTINUE these medications which have CHANGED   Details  furosemide (LASIX) 80 MG tablet Take 1 tablet (80 mg total) by mouth 2 (two) times daily.    lisinopril (PRINIVIL,ZESTRIL) 20 MG tablet Take 1 tablet (20 mg total) by mouth daily.    predniSONE (DELTASONE) 5 MG tablet Take 1 tablet (5 mg total) by mouth daily as needed. Gout..tapered dosage      CONTINUE these medications which have NOT CHANGED   Details  amLODipine (NORVASC) 10 MG tablet TAKE 1 TABLET BY MOUTH EVERY DAY Qty: 30 tablet, Refills: 6  aspirin 81 MG tablet Take 81 mg by mouth daily.      ferrous gluconate (FERGON) 246 (28 FE) MG tablet Take 246 mg by mouth 2 (two) times daily after a meal.      fish oil-omega-3 fatty acids 1000 MG capsule Take 1 g by mouth 2 (two) times daily.     hydrALAZINE (APRESOLINE) 50 MG tablet      HYDROcodone-acetaminophen (NORCO) 5-325 MG per tablet Take 1 tablet by mouth 2 (two) times daily as needed. For pain    hydroxychloroquine (PLAQUENIL) 200 MG tablet Take 1 tablet by mouth daily.    levothyroxine (SYNTHROID, LEVOTHROID) 75 MCG tablet TAKE 1 TABLET BY MOUTH EVERY DAY Qty: 90 tablet, Refills: 1    metoprolol (LOPRESSOR) 50 MG tablet Take 1 tablet (50 mg total) by mouth 2 (two) times daily. Qty: 60 tablet, Refills: 5    Multiple Vitamin (MULTIVITAMIN) capsule Take 1 capsule by mouth daily.     omeprazole (PRILOSEC) 20 MG capsule Take 20 mg by mouth daily.       ULORIC 40 MG tablet Take 1 tablet by mouth daily.      STOP taking these medications     Probiotic Product (PROBIOTIC PO)          Signed: Emonnie Cannady IV, V. WELLS 12/01/2010, 10:46 AM

## 2010-12-13 ENCOUNTER — Other Ambulatory Visit: Payer: Self-pay | Admitting: Internal Medicine

## 2010-12-13 DIAGNOSIS — Z1231 Encounter for screening mammogram for malignant neoplasm of breast: Secondary | ICD-10-CM

## 2010-12-22 ENCOUNTER — Other Ambulatory Visit (INDEPENDENT_AMBULATORY_CARE_PROVIDER_SITE_OTHER): Payer: Medicare Other

## 2010-12-22 ENCOUNTER — Ambulatory Visit (INDEPENDENT_AMBULATORY_CARE_PROVIDER_SITE_OTHER): Payer: Medicare Other | Admitting: Internal Medicine

## 2010-12-22 ENCOUNTER — Encounter: Payer: Self-pay | Admitting: Internal Medicine

## 2010-12-22 VITALS — BP 140/82 | HR 65 | Temp 97.6°F | Wt 131.0 lb

## 2010-12-22 DIAGNOSIS — R29810 Facial weakness: Secondary | ICD-10-CM

## 2010-12-22 DIAGNOSIS — D509 Iron deficiency anemia, unspecified: Secondary | ICD-10-CM

## 2010-12-22 DIAGNOSIS — N63 Unspecified lump in unspecified breast: Secondary | ICD-10-CM

## 2010-12-22 DIAGNOSIS — D649 Anemia, unspecified: Secondary | ICD-10-CM

## 2010-12-22 LAB — IBC PANEL
Iron: 101 ug/dL (ref 42–145)
Transferrin: 270.1 mg/dL (ref 212.0–360.0)

## 2010-12-22 LAB — CBC WITH DIFFERENTIAL/PLATELET
Eosinophils Relative: 0.8 % (ref 0.0–5.0)
HCT: 39.3 % (ref 36.0–46.0)
Hemoglobin: 13.3 g/dL (ref 12.0–15.0)
Lymphs Abs: 1.5 10*3/uL (ref 0.7–4.0)
MCV: 92.9 fl (ref 78.0–100.0)
Monocytes Absolute: 0.6 10*3/uL (ref 0.1–1.0)
Neutro Abs: 6.5 10*3/uL (ref 1.4–7.7)
Platelets: 339 10*3/uL (ref 150.0–400.0)
RDW: 14.4 % (ref 11.5–14.6)

## 2010-12-22 LAB — SEDIMENTATION RATE: Sed Rate: 16 mm/hr (ref 0–22)

## 2010-12-22 LAB — CK: Total CK: 42 U/L (ref 7–177)

## 2010-12-22 NOTE — Patient Instructions (Signed)
Pain in the left chest wall - normal exam: no mass or lump or tender area. Breast exam is normal. No further testing needed.  Muscle weakness effecting muscles of mastication and swallow. Plan - lab to evaluate muscle inflammation or muscle related disease.  Anemia - mild anemia at the time of hospital discharge for which you are taking iron. Plan - lab to check blood count and iron level.

## 2010-12-22 NOTE — Progress Notes (Signed)
Subjective:    Patient ID: PATICIA MOSTER, female    DOB: Nov 01, 1919, 77 y.o.   MRN: [id number]  HPI Mrs. Cancel presents with a c/o pain on the left chest wall at her bra line. She has not had a mammogram for several years.  She c/o that her muscles of mastication and swallow seem to fatigue quickly making eating difficult. She does admit to having drooping eyelids at time.  She has recently had stenting of the SMA by Dr. Dahlia Byes. During her hospital stay Hrb went from 12.3 to 11 g. On 12/01/10. She is due for follow-up lab.   I have reviewed the patient's medical history in detail and updated the computerized patient record.  Past Medical History  Diagnosis Date  . Chronic kidney disease     chronic  . Cardiomyopathy     EF 40%. likely tachycardia induced  . Dyspepsia   . Peripheral vascular disease   . 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 Thyroid disease     hypothyroidism  . Hypertension   . Renovascular hypertension     s/p stenting  . CHF (congestive heart failure)     EF 40%  . Arthritis   . CVA (cerebral vascular accident) 06/2005  . Mitral regurgitation   . Hiatal hernia   . Atrial flutter March 2011    s/p ablation    Past Surgical History  Procedure Date  . Cardiac catheterization   . Cholecystectomy     laporascopic  . Abdominal hysterectomy   . Oophorectomy   . Tonsillectomy   . Ras stents 2005    bilateral  . Sma stenosis     NOv '12 stented   Family History  Problem Relation Age of Onset  . Heart disease Mother    History   Social History  . Marital Status: Widowed    Spouse Name: N/A    Number of Children: N/A  . Years of Education: N/A   Occupational History  . Not on file.   Social History Main Topics  . Smoking status: Never Smoker   . Smokeless tobacco: Never Used  . Alcohol Use: No  . Drug Use: No  . Sexually Active: No   Other Topics Concern  . Not on file   Social History Narrative   lives alone. I-ADL's. married 60 yrs, widowed May 28,'07. 2 miscarriages, 2 sons: '50, '53. 1 daughter: '48. 7 grandchildren       Review of Systems System review is negative for any constitutional, cardiac, pulmonary, GI or neuro symptoms or complaints other than as described in the HPI.     Objective:   Physical Exam Vitals noted -stable Gen'l - older white woman in no distress Pulm - normal respirations Cor- 2+ radial , RRR Breast exam - skin normal, nipples without discharge, no fixed mass or lesions, no axillary adenopathy. There was no palpable mass or nodules on the left chest wall back to the posterior axillary line Neuro - A&O x 3, CN II-XII normal, MS - normal facial muscle movement but eye lids do droop, masseters normal to palpation without tenderness or mass, normal jaw movement.       Assessment & Plan:  1. Breast tenderness/lump - negative exam  Plan - watchful waiting           Mammogram at patient's descretion, although of probably small yield in the face of a normal exam.  2. Muscle weakness -  reports easily fatigability of facial muscle. Remote possibility of Myasthenia Gravis  Plan - antibody titres for MG

## 2010-12-25 ENCOUNTER — Encounter: Payer: Self-pay | Admitting: Internal Medicine

## 2010-12-28 ENCOUNTER — Ambulatory Visit (INDEPENDENT_AMBULATORY_CARE_PROVIDER_SITE_OTHER): Payer: Medicare Other | Admitting: *Deleted

## 2010-12-28 ENCOUNTER — Other Ambulatory Visit (INDEPENDENT_AMBULATORY_CARE_PROVIDER_SITE_OTHER): Payer: Medicare Other | Admitting: *Deleted

## 2010-12-28 DIAGNOSIS — K551 Chronic vascular disorders of intestine: Secondary | ICD-10-CM

## 2010-12-28 DIAGNOSIS — Z48812 Encounter for surgical aftercare following surgery on the circulatory system: Secondary | ICD-10-CM

## 2010-12-30 ENCOUNTER — Encounter: Payer: Self-pay | Admitting: Surgery

## 2011-01-02 ENCOUNTER — Ambulatory Visit (INDEPENDENT_AMBULATORY_CARE_PROVIDER_SITE_OTHER): Payer: Medicare Other | Admitting: *Deleted

## 2011-01-02 ENCOUNTER — Ambulatory Visit (INDEPENDENT_AMBULATORY_CARE_PROVIDER_SITE_OTHER): Payer: Medicare Other | Admitting: Surgery

## 2011-01-02 ENCOUNTER — Encounter: Payer: Self-pay | Admitting: Surgery

## 2011-01-02 VITALS — BP 170/72 | HR 69 | Resp 16 | Ht 63.0 in | Wt 129.0 lb

## 2011-01-02 DIAGNOSIS — K551 Chronic vascular disorders of intestine: Secondary | ICD-10-CM

## 2011-01-02 DIAGNOSIS — R944 Abnormal results of kidney function studies: Secondary | ICD-10-CM

## 2011-01-02 DIAGNOSIS — I701 Atherosclerosis of renal artery: Secondary | ICD-10-CM

## 2011-01-02 NOTE — Progress Notes (Signed)
[facility] [place]   Patient name: Elizabeth Wall MRN: [id number] DOB: 1919/09/26 Sex: female     Chief Complaint  Patient presents with  . Routine Post Op    SMA Stenting    HISTORY OF PRESENT ILLNESS: The patient comes back today for followup of her chronic mesenteric ischemia. She initially underwent superior mesenteric artery stenting in January of 2012 with a dramatic improvement in her ability to tolerate food. On April 24 she is found to have in-stent stenosis and underwent restenting of this area. Again she was found to have recurrent stenosis and most recently on November 14 she underwent stenting using a covered stent (atrium). She had to be admitted preprocedure for hydration as she had an elevated creatinine. This was felt to be due to her inability to tolerate by mouth. Since her stenting she has had significant improvement in her appetite and has no complaints at this time.  Past Medical History  Diagnosis Date  . Chronic kidney disease     chronic  . Cardiomyopathy     EF 40%. likely tachycardia induced  . Dyspepsia   . Peripheral vascular disease   . 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 Thyroid disease     hypothyroidism  . Hypertension   . Renovascular hypertension     s/p stenting  . CHF (congestive heart failure)     EF 40%  . Arthritis   . CVA (cerebral vascular accident) 06/2005  . Mitral regurgitation   . Hiatal hernia   . Atrial flutter March 2011    s/p ablation   . Anemia     Past Surgical History  Procedure Date  . Cardiac catheterization   . Cholecystectomy     laporascopic  . Abdominal hysterectomy   . Oophorectomy   . Tonsillectomy   . Ras stents 2005    bilateral  . Sma stenosis     NOv '12 stented  . Eye surgery     History   Social History  . Marital Status: Widowed    Spouse Name: N/A    Number of Children: N/A  . Years of Education: N/A   Occupational History    . Not on file.   Social History Main Topics  . Smoking status: Never Smoker   . Smokeless tobacco: Never Used  . Alcohol Use: No  . Drug Use: No  . Sexually Active: No   Other Topics Concern  . Not on file   Social History Narrative   lives alone. I-ADL's. married 60 yrs, widowed May 28,'07. 2 miscarriages, 2 sons: '50, '53. 1 daughter: '48. 7 grandchildren    Family History  Problem Relation Age of Onset  . Heart disease Mother   . Hypertension Father   . Diabetes Brother     Allergies as of 01/02/2011 - Review Complete 01/02/2011  Allergen Reaction Noted  . Augmentin Diarrhea 04/26/2010  . Clonidine derivatives Other (See Comments) 09/28/2010  . FAO:ZHYQMVHQION+GEXBMWUXL+KGMWNUUVOZ acid+aspartame Other (See Comments)   . Moxifloxacin Other (See Comments) 04/06/2009  . Sulfa antibiotics Nausea Only 04/26/2010  . Sulfamethoxazole Other (See Comments) 08/07/2006  . Sulfonamide derivatives Nausea Only 07/28/2006    Current Outpatient Prescriptions on File Prior to Visit  Medication Sig Dispense Refill  . amLODipine (NORVASC) 10 MG tablet TAKE 1 TABLET BY MOUTH EVERY DAY  30 tablet  6  . aspirin 81 MG tablet Take 81 mg by mouth daily.        Marland Kitchen  clopidogrel (PLAVIX) 75 MG tablet Take 1 tablet (75 mg total) by mouth daily.  30 tablet  11  . furosemide (LASIX) 80 MG tablet Take 1 tablet (80 mg total) by mouth 2 (two) times daily.      . hydrALAZINE (APRESOLINE) 50 MG tablet        . HYDROcodone-acetaminophen (NORCO) 5-325 MG per tablet Take 1 tablet by mouth 2 (two) times daily as needed. For pain      . hydroxychloroquine (PLAQUENIL) 200 MG tablet Take 1 tablet by mouth daily.      Marland Kitchen levothyroxine (SYNTHROID, LEVOTHROID) 75 MCG tablet TAKE 1 TABLET BY MOUTH EVERY DAY  90 tablet  1  . lisinopril (PRINIVIL,ZESTRIL) 20 MG tablet Take 1 tablet (20 mg total) by mouth daily.      . metoprolol (LOPRESSOR) 50 MG tablet Take 1 tablet (50 mg total) by mouth 2 (two) times daily.  60  tablet  5  . Multiple Vitamin (MULTIVITAMIN) capsule Take 1 capsule by mouth daily.       Marland Kitchen omeprazole (PRILOSEC) 20 MG capsule Take 20 mg by mouth daily.        . predniSONE (DELTASONE) 5 MG tablet Take 1 tablet (5 mg total) by mouth daily as needed. Gout..tapered dosage      . ULORIC 40 MG tablet Take 1 tablet by mouth daily.      . ferrous gluconate (FERGON) 246 (28 FE) MG tablet Take 246 mg by mouth 2 (two) times daily after a meal.        . fish oil-omega-3 fatty acids 1000 MG capsule Take 1 g by mouth 2 (two) times daily.          REVIEW OF SYSTEMS: Cardiovascular: No chest pain, chest pressure, palpitations, orthopnea, or dyspnea on exertion. No claudication or rest pain,  No history of DVT or phlebitis. Pulmonary: No productive cough, asthma or wheezing. Neurologic: No weakness, paresthesias, aphasia, or amaurosis. No dizziness. Hematologic: No bleeding problems or clotting disorders. Musculoskeletal: No joint pain or joint swelling. Gastrointestinal: No blood in stool or hematemesis Genitourinary: No dysuria or hematuria. Psychiatric:: No history of major depression. Integumentary: No rashes or ulcers. Constitutional: No fever or chills.  PHYSICAL EXAMINATION:   Vital signs are BP 170/72  Pulse 69  Resp 16  Ht 5\' 3"  (1.6 m)  Wt 129 lb (58.514 kg)  BMI 22.85 kg/m2  SpO2 97% General: The patient appears their stated age. HEENT:  No gross abnormalities Pulmonary:  Non labored breathing Abdomen: Soft and non-tender positive bowel sounds. No bruits. Musculoskeletal: There are no major deformities. Neurologic: No focal weakness or paresthesias are detected, Skin: There are no ulcer or rashes noted. Psychiatric: The patient has normal affect. Cardiovascular: Palpable right femoral pulse without bruit or thrill   Diagnostic Studies Duplex today shows patent bilateral renal arteries without elevated velocities. Her assisted indices are elevated. There 0.86 bilaterally.  No  hemodynamically significant stenosis was identified in the celiac and superior mesenteric artery however the velocity profile is slightly abnormal. Peak velocity on the superior mesenteric artery is 317.  Assessment: Chronic mesenteric ischemia status post stenting Plan: After her most recent intervention she is now asymptomatic. I will continue her on her Plavix. We did discuss that if she has trouble with this in regards to recurrent stenosis we may need to entertain operative revascularization. Hopefully it will not come to this will see how she does in the immediate future. I have scheduled her to come back for  repeat ultrasound in 6 months  V. Charlena Cross, M.D. [facility] [place] Office: [phone] Pager:  [phone]

## 2011-01-23 NOTE — Procedures (Unsigned)
MESENTERIC ARTERIAL DUPLEX EVALUATION  INDICATION:  Chronic mesenteric ischemia, status post multiple SMA stents.  HISTORY: Diabetes:  No. Cardiac:  Yes. Hypertension:  Yes. Smoking:  No.  Mesenteric Duplex Findings: Aorta - Proximal                            84 Aorta - Mid                                 182 Aorta - Distal                              144  Celiac Trunk - Proximal                     320 Celiac Trunk - Distal                       NV  Hepatic Artery                              86 Splenic Artery                              116  Superior Mesenteric Artery-Origin           264 Superior Mesenteric Artery-Proximal         317 Superior Mesenteric Artery-Mid              207 Superior Mesenteric Artery-Distal           139  Inferior Mesenteric Artery-Proximal         219  IMPRESSION:  Patent superior mesenteric artery stent with elevated velocities.  Difficult visualization due to calcification and stent, unable to rule out restenosis versus stent rigidity.  Distal waveforms appear only mildly turbulent.        ___________________________________________ V. Charlena Cross, MD  LT/MEDQ  D:  12/28/2010  T:  12/28/2010  Job:  [id number]

## 2011-01-23 NOTE — Procedures (Unsigned)
RENAL ARTERY DUPLEX EVALUATION  INDICATION:  Abnormal renal function.  HISTORY: Diabetes:  No. Cardiac:  Yes. Hypertension:  Yes. Smoking:  No.  RENAL ARTERY DUPLEX FINDINGS: Aorta-Proximal:  95 cm/s Aorta-Mid:  120 cm/s Aorta-Distal:  114 cm/s Celiac Artery Origin:  298/23 cm/s SMA Origin:  313/35 cm/s                                   RIGHT               LEFT Renal Artery Origin:             122 cm/s            Not visualized Renal Artery Proximal:           103 cm/s            75 cm/s Renal Artery Mid:                48 cm/s             101 cm/s Renal Artery Distal:             68 cm/s             111 cm/s Hilar Acceleration Time (AT): Renal-Aortic Ratio (RAR):        1.0                 0.9 Kidney Size:                     10.3 cm             11.3 cm End Diastolic Ratio (EDR): Resistive Index (RI):            0.86                0.86  IMPRESSION: 1. Patent bilateral renal arteries with no elevated velocities noted,     based on limited visualization.  Unable to determine an accurate     renal-aortic ratio due to atherosclerosis of the abdominal aorta. 2. Abnormal bilateral intrarenal resistive indices noted. 3. The bilateral kidney length measurements are within normal limits     with multiple cystic structures noted in the left kidney. 4. Elevated velocities noted in the celiac and superior mesenteric     arteries; however, by criteria, they are not hemodynamically     significant.  No significant change in the celiac and superior     mesenteric artery velocities when compared to the previous     examination on 12/28/2010. 5. Doppler velocities of the bilateral renal arteries appear less than     previously recorded on examination from 11/21/2010 based on limited     visualization due to overlying bowel gas patterns and acoustic     shadowing.  ___________________________________________ V. Charlena Cross, MD  CH/MEDQ  D:  01/03/2011  T:   01/03/2011  Job:  [id number]

## 2011-01-30 ENCOUNTER — Other Ambulatory Visit: Payer: Self-pay | Admitting: Cardiology

## 2011-02-20 ENCOUNTER — Encounter: Payer: Self-pay | Admitting: Cardiovascular Disease

## 2011-02-22 ENCOUNTER — Ambulatory Visit (INDEPENDENT_AMBULATORY_CARE_PROVIDER_SITE_OTHER): Payer: Medicare Other | Admitting: Internal Medicine

## 2011-02-22 ENCOUNTER — Encounter: Payer: Self-pay | Admitting: Internal Medicine

## 2011-02-22 VITALS — BP 138/64 | HR 79 | Resp 18 | Ht 62.0 in | Wt 131.0 lb

## 2011-02-22 DIAGNOSIS — I10 Essential (primary) hypertension: Secondary | ICD-10-CM

## 2011-02-22 DIAGNOSIS — K551 Chronic vascular disorders of intestine: Secondary | ICD-10-CM

## 2011-02-22 DIAGNOSIS — I4892 Unspecified atrial flutter: Secondary | ICD-10-CM

## 2011-02-22 DIAGNOSIS — E785 Hyperlipidemia, unspecified: Secondary | ICD-10-CM

## 2011-02-22 NOTE — Progress Notes (Signed)
PCP:  Illene Regulus, MD, MD Referring:  Dr Myra Gianotti   The patient presents today for routine cardiology followup.  Since last being seen by me, the patient reports doing reasonably well.  She continues to follow closely with Dr Myra Gianotti for mesenteric ischemia.  She remains reasonably active for her age.  She reports bruising with plavix.  She has occasional edema.    Today, she denies symptoms of palpitations, chest pain, shortness of breath, orthopnea, PND,dizziness, presyncope, syncope, or neurologic sequela.  The patient feels that she is tolerating medications without difficulties and is otherwise without complaint today.   Past Medical History  Diagnosis Date  . Chronic kidney disease     chronic  . Cardiomyopathy     EF 40%.    Marland Kitchen Dyspepsia   . Peripheral vascular disease   . 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 Thyroid disease     hypothyroidism  . Hypertension   . Renovascular hypertension     s/p stenting by Dr Samule Ohm  . Chronic systolic dysfunction of left ventricle   . Arthritis   . CVA (cerebral vascular accident) 06/2005  . Mitral regurgitation   . Hiatal hernia   . Atrial flutter March 2011    s/p CTI ablation by Dr Ladona Ridgel  . Anemia    Past Surgical History  Procedure Date  . Cardiac catheterization   . Cholecystectomy     laporascopic  . Abdominal hysterectomy   . Oophorectomy   . Tonsillectomy   . Ras stents 2005    bilateral  . Sma stenosis     NOv '12 stented  . Eye surgery     Current Outpatient Prescriptions  Medication Sig Dispense Refill  . amLODipine (NORVASC) 10 MG tablet TAKE 1 TABLET BY MOUTH EVERY DAY  30 tablet  6  . aspirin 81 MG tablet Take 81 mg by mouth daily.        . clopidogrel (PLAVIX) 75 MG tablet Take 1 tablet (75 mg total) by mouth daily.  30 tablet  11  . fish oil-omega-3 fatty acids 1000 MG capsule Take 1 g by mouth 2 (two) times daily.       . furosemide  (LASIX) 80 MG tablet Take 1 tablet (80 mg total) by mouth 2 (two) times daily.      . hydrALAZINE (APRESOLINE) 50 MG tablet        . HYDROcodone-acetaminophen (NORCO) 5-325 MG per tablet Take 1 tablet by mouth 2 (two) times daily as needed. For pain      . hydroxychloroquine (PLAQUENIL) 200 MG tablet Take 1 tablet by mouth daily.      Marland Kitchen levothyroxine (SYNTHROID, LEVOTHROID) 75 MCG tablet TAKE 1 TABLET BY MOUTH EVERY DAY  90 tablet  1  . lisinopril (PRINIVIL,ZESTRIL) 20 MG tablet TAKE 1 TABLET EVERY DAY  30 tablet  11  . metoprolol (LOPRESSOR) 50 MG tablet Take 1 tablet (50 mg total) by mouth 2 (two) times daily.  60 tablet  5  . Multiple Vitamin (MULTIVITAMIN) capsule Take 1 capsule by mouth daily.       Marland Kitchen omeprazole (PRILOSEC) 20 MG capsule Take 20 mg by mouth daily.        . predniSONE (DELTASONE) 5 MG tablet Take 1 tablet (5 mg total) by mouth daily as needed. Gout..tapered dosage      . ULORIC 40 MG tablet Take 1 tablet by mouth daily.  Allergies  Allergen Reactions  . Augmentin Diarrhea  . Clonidine Derivatives Other (See Comments)    Blood pressure dropped too low  . FAO:ZHYQMVHQION+GEXBMWUXL+KGMWNUUVOZ Acid+Aspartame Other (See Comments)     unknown  . Moxifloxacin Other (See Comments)    REACTION: patient developed tremors and vibrato  . Sulfa Antibiotics Nausea Only  . Sulfamethoxazole Other (See Comments)    REACTION: unspecified  . Sulfonamide Derivatives Nausea Only    History   Social History  . Marital Status: Widowed    Spouse Name: N/A    Number of Children: N/A  . Years of Education: N/A   Occupational History  . Not on file.   Social History Main Topics  . Smoking status: Never Smoker   . Smokeless tobacco: Never Used  . Alcohol Use: No  . Drug Use: No  . Sexually Active: No   Other Topics Concern  . Not on file   Social History Narrative   lives alone. I-ADL's. married 60 yrs, widowed May 28,'07. 2 miscarriages, 2 sons: '50, '53. 1 daughter:  '48. 7 grandchildren    Family History  Problem Relation Age of Onset  . Heart disease Mother   . Hypertension Father   . Diabetes Brother    Physical Exam: Filed Vitals:   02/22/11 1152  BP: 138/64  Pulse: 79  Resp: 18  Height: 5\' 2"  (1.575 m)  Weight: 131 lb (59.421 kg)    GEN- The patient is elderly appearing, alert and oriented x 3 today.   Head- normocephalic, atraumatic Eyes-  Sclera clear, conjunctiva pink Ears- hearing intact Oropharynx- clear Neck- supple, no JVP Lymph- no cervical lymphadenopathy Lungs- Clear to ausculation bilaterally, normal work of breathing Heart- Regular rate and rhythm,  GI- soft, NT, ND, + BS Extremities- no clubbing, cyanosis, trace edema MS- changes in wrists of rheumatoid arthritis Skin- ecchymosis over both hands Psych- euthymic mood, full affect Neuro- strength and sensation are intact  ekg today reveals sinus rhythm with PVCs, no ischemic changes  Assessment and Plan:

## 2011-02-22 NOTE — Patient Instructions (Signed)
Your physician wants you to follow-up in: 6 months with Dr Jacquiline Doe will receive a reminder letter in the mail two months in advance. If you don't receive a letter, please call our office to schedule the follow-up appointment.   Your physician recommends that you return for lab work at [facility] and liver panel  Patient given order

## 2011-02-22 NOTE — Assessment & Plan Note (Signed)
Stable Per Dr Myra Gianotti

## 2011-02-22 NOTE — Assessment & Plan Note (Signed)
Reasonably well controlled No changes today

## 2011-02-22 NOTE — Assessment & Plan Note (Signed)
Resolved s/p ablation I am not aware of any history of afib.  She therefore does not have an indication for coumadin at this time.

## 2011-02-22 NOTE — Assessment & Plan Note (Signed)
Though I am typically reluctant to treat with statins in patients of her age, given her h/o diffuse atherosclerosis (renal artery stenosis/ carotid stenosis/ and mesenteric ischemia) she may benefit from crestor. I will check fasting lipids and LFTs and consider crestor based on results.

## 2011-02-24 ENCOUNTER — Other Ambulatory Visit: Payer: Self-pay

## 2011-02-24 MED ORDER — HYDRALAZINE HCL 50 MG PO TABS: 50.0000 mg | ORAL_TABLET | Freq: Three times a day (TID) | ORAL | Status: DC

## 2011-02-24 MED ORDER — METOPROLOL TARTRATE 50 MG PO TABS: 50.0000 mg | ORAL_TABLET | Freq: Two times a day (BID) | ORAL | Status: DC

## 2011-02-24 MED ORDER — AMLODIPINE BESYLATE 10 MG PO TABS: 10.0000 mg | ORAL_TABLET | Freq: Every day | ORAL | Status: DC

## 2011-02-24 NOTE — Telephone Encounter (Signed)
.   Requested Prescriptions   Signed Prescriptions Disp Refills  . metoprolol (LOPRESSOR) 50 MG tablet 60 tablet 5    Sig: Take 1 tablet (50 mg total) by mouth 2 (two) times daily.    Authorizing Provider: Hillis Range D    Ordering User: Lacie Scotts

## 2011-02-24 NOTE — Telephone Encounter (Signed)
.   Requested Prescriptions   Signed Prescriptions Disp Refills  . amLODipine (NORVASC) 10 MG tablet 30 tablet 6    Sig: Take 1 tablet (10 mg total) by mouth daily.    Authorizing Provider: Hillis Range D    Ordering User: Lacie Scotts hydrALAZINE (APRESOLINE) 50 MG tablet 90 tablet 6    Sig: Take 1 tablet (50 mg total) by mouth 3 (three) times daily.    Authorizing Provider: Hillis Range D    Ordering User: Lacie Scotts

## 2011-02-27 ENCOUNTER — Encounter: Payer: Self-pay | Admitting: Internal Medicine

## 2011-04-28 ENCOUNTER — Other Ambulatory Visit: Payer: Self-pay | Admitting: Internal Medicine

## 2011-04-28 MED ORDER — FUROSEMIDE 80 MG PO TABS: 80.0000 mg | ORAL_TABLET | Freq: Two times a day (BID) | ORAL | Status: DC

## 2011-05-17 ENCOUNTER — Other Ambulatory Visit: Payer: Self-pay | Admitting: Internal Medicine

## 2011-06-05 ENCOUNTER — Telehealth: Payer: Self-pay | Admitting: Internal Medicine

## 2011-06-05 NOTE — Telephone Encounter (Signed)
Pt taking plavix and having a  lot of  bruising , pls advise

## 2011-06-05 NOTE — Telephone Encounter (Signed)
Will call Dr Estanislado Spire office to discuss.  I have talked with patient and let her know she needs to follow up with his office

## 2011-06-26 ENCOUNTER — Encounter: Payer: Self-pay | Admitting: Internal Medicine

## 2011-06-26 ENCOUNTER — Ambulatory Visit (INDEPENDENT_AMBULATORY_CARE_PROVIDER_SITE_OTHER): Payer: Medicare Other | Admitting: Internal Medicine

## 2011-06-26 ENCOUNTER — Other Ambulatory Visit (INDEPENDENT_AMBULATORY_CARE_PROVIDER_SITE_OTHER): Payer: Medicare Other

## 2011-06-26 VITALS — BP 154/70 | HR 56 | Temp 97.6°F | Resp 16 | Wt 128.0 lb

## 2011-06-26 DIAGNOSIS — I10 Essential (primary) hypertension: Secondary | ICD-10-CM

## 2011-06-26 DIAGNOSIS — I2510 Atherosclerotic heart disease of native coronary artery without angina pectoris: Secondary | ICD-10-CM

## 2011-06-26 DIAGNOSIS — K551 Chronic vascular disorders of intestine: Secondary | ICD-10-CM

## 2011-06-26 DIAGNOSIS — E785 Hyperlipidemia, unspecified: Secondary | ICD-10-CM

## 2011-06-26 DIAGNOSIS — I701 Atherosclerosis of renal artery: Secondary | ICD-10-CM

## 2011-06-26 DIAGNOSIS — E039 Hypothyroidism, unspecified: Secondary | ICD-10-CM

## 2011-06-26 DIAGNOSIS — N189 Chronic kidney disease, unspecified: Secondary | ICD-10-CM

## 2011-06-26 LAB — COMPREHENSIVE METABOLIC PANEL
Albumin: 4.2 g/dL (ref 3.5–5.2)
BUN: 78 mg/dL — ABNORMAL HIGH (ref 6–23)
Calcium: 9.2 mg/dL (ref 8.4–10.5)
Chloride: 100 mEq/L (ref 96–112)
Glucose, Bld: 104 mg/dL — ABNORMAL HIGH (ref 70–99)
Potassium: 4.3 mEq/L (ref 3.5–5.1)

## 2011-06-26 LAB — T4, FREE: Free T4: 1.49 ng/dL (ref 0.60–1.60)

## 2011-06-26 LAB — LIPID PANEL
Cholesterol: 184 mg/dL (ref 0–200)
Triglycerides: 180 mg/dL — ABNORMAL HIGH (ref 0.0–149.0)
VLDL: 36 mg/dL (ref 0.0–40.0)

## 2011-06-26 LAB — HEPATIC FUNCTION PANEL
Bilirubin, Direct: 0.1 mg/dL (ref 0.0–0.3)
Total Bilirubin: 0.7 mg/dL (ref 0.3–1.2)

## 2011-06-26 NOTE — Progress Notes (Signed)
Subjective:    Patient ID: Elizabeth Wall, female    DOB: July 24, 1919, 78 y.o.   MRN: [id number]  HPI Elizabeth Wall presents today for follow-up of thyroid disease. She is feeling that she is symptomatic: thinning hair, spit nails, cold all the time, no energy. She reports she never received letter from Dec 9, '12 with lab results. She is informed that her sed rate was normal, iron and reticulocyte, acetycholine antibody for myesthenia Gravis was negative. She continue to have periodic difficulty with tightness in the muscles of mastication and swallow.   She is due to see Dr. Myra Gianotti  for follow up of mesenteric stenosis with stent placement in November '12. She has been doing well.  She was seeing Dr. Samuella Cota but most recently saw Dr. Johney Frame. She would like to see Dr. Samuella Cota if possible.   Past Medical History  Diagnosis Date  . Chronic kidney disease     chronic  . Cardiomyopathy     EF 40%.    Marland Kitchen Dyspepsia   . Peripheral vascular disease   . 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 Thyroid disease     hypothyroidism  . Hypertension   . Renovascular hypertension     s/p stenting by Dr Samule Ohm  . Chronic systolic dysfunction of left ventricle   . Arthritis   . CVA (cerebral vascular accident) 06/2005  . Mitral regurgitation   . Hiatal hernia   . Atrial flutter March 2011    s/p CTI ablation by Dr Ladona Ridgel  . Anemia    Past Surgical History  Procedure Date  . Cardiac catheterization   . Cholecystectomy     laporascopic  . Abdominal hysterectomy   . Oophorectomy   . Tonsillectomy   . Ras stents 2005    bilateral  . Sma stenosis     NOv '12 stented  . Eye surgery    Family History  Problem Relation Age of Onset  . Heart disease Mother   . Hypertension Father   . Diabetes Brother    History   Social History  . Marital Status: Widowed    Spouse Name: N/A    Number of Children: N/A  . Years of Education:  N/A   Occupational History  . Not on file.   Social History Main Topics  . Smoking status: Never Smoker   . Smokeless tobacco: Never Used  . Alcohol Use: No  . Drug Use: No  . Sexually Active: No   Other Topics Concern  . Not on file   Social History Narrative   lives alone. I-ADL's. married 60 yrs, widowed May 28,'07. 2 miscarriages, 2 sons: '50, '53. 1 daughter: '48. 7 grandchildren      Review of Systems System review is negative for any constitutional, cardiac, pulmonary, GI or neuro symptoms or complaints other than as described in the HPI.     Objective:   Physical Exam Filed Vitals:   06/26/11 1101  BP: 154/70  Pulse: 56  Temp: 97.6 F (36.4 C)  Resp: 16   Wt Readings from Last 3 Encounters:  06/26/11 128 lb (58.06 kg)  02/22/11 131 lb (59.421 kg)  01/02/11 129 lb (58.514 kg)   Gen'l- well preserved nonogenarian in no acute distress HEENT  - C&S clear  Neck - w/o thyromegaly or tenderness Nodes - Negative Cor - IRIR rate controlled PUlm - normal respirations, lungs CTAP  Lab Results  Component Value Date   WBC 8.7 12/22/2010   HGB 13.3 12/22/2010   HCT 39.3 12/22/2010   PLT 339.0 12/22/2010   GLUCOSE 104* 06/26/2011   CHOL 184 06/26/2011   TRIG 180.0* 06/26/2011   HDL 37.00* 06/26/2011   LDLDIRECT 76.7 09/18/2007   LDLCALC 111* 06/26/2011        ALT 15 06/26/2011   AST 19 06/26/2011        NA 139 06/26/2011   K 4.3 06/26/2011   CL 100 06/26/2011   CREATININE 2.1* 06/26/2011   BUN 78* 06/26/2011   CO2 26 06/26/2011   TSH 0.40 06/26/2011   INR 1.20 02/08/2010       Free T4                  1.49 (normal range)      Assessment & Plan:

## 2011-06-27 ENCOUNTER — Telehealth: Payer: Self-pay | Admitting: *Deleted

## 2011-06-27 ENCOUNTER — Encounter: Payer: Self-pay | Admitting: Internal Medicine

## 2011-06-27 NOTE — Assessment & Plan Note (Signed)
Stable w/o symptoms. She would like to resume care with Dr. Samuella Cota if he is available

## 2011-06-27 NOTE — Telephone Encounter (Signed)
Left message for patient to call back concerning lab studies per Dr. Debby Bud

## 2011-06-27 NOTE — Assessment & Plan Note (Signed)
Lab Results  Component Value Date   CHOL 184 06/26/2011   HDL 37.00* 06/26/2011   LDLCALC 111* 06/26/2011   LDLDIRECT 76.7 09/18/2007   TRIG 180.0* 06/26/2011   CHOLHDL 5 06/26/2011   Stable

## 2011-06-27 NOTE — Assessment & Plan Note (Signed)
Stable without abdominal pain or post-prandial pain.  Plan  Follow-up with [facility] as scheduled.

## 2011-06-27 NOTE — Telephone Encounter (Signed)
Message copied by Elnora Morrison on Tue Jun 27, 2011  8:29 AM ------      Message from: Jacques Navy      Created: Tue Jun 27, 2011  4:55 AM       Call patient:      1. Thyroid function is normal      2. Cholesterol is controlled      3. Stop lisinopril - due to poor kidney function      4. Come to lab for materials to do a 24 hour urine study to quantify kidney function      5. Return for lab in 7-10 days: blood test to monitor kidney function off lisinopril

## 2011-06-27 NOTE — Telephone Encounter (Signed)
Patient notified of need to have test done ASAP instead of waiting till next week. Patient understand and her son will pick up 24 hour materials tomorrow for her to start the test. And will return to have lab test on the June 20th when she comes to [facility] to see her nephrologist.

## 2011-06-27 NOTE — Assessment & Plan Note (Signed)
BP Readings from Last 3 Encounters:  06/26/11 154/70  02/22/11 138/64  01/02/11 170/72   She is on a multi-drug regimen. Will need to stop ACE-I in regard to renal function.  Plan - will defer to cardiology for assistance with difficult to manage patient

## 2011-06-27 NOTE — Telephone Encounter (Signed)
Message copied by Elnora Morrison on Tue Jun 27, 2011 11:49 AM ------      Message from: Jacques Navy      Created: Tue Jun 27, 2011  4:55 AM       Call patient:      1. Thyroid function is normal      2. Cholesterol is controlled      3. Stop lisinopril - due to poor kidney function      4. Come to lab for materials to do a 24 hour urine study to quantify kidney function      5. Return for lab in 7-10 days: blood test to monitor kidney function off lisinopril

## 2011-06-27 NOTE — Telephone Encounter (Signed)
Message copied by Elnora Morrison on Tue Jun 27, 2011  5:03 PM ------      Message from: Jacques Navy      Created: Tue Jun 27, 2011  4:55 AM       Call patient:      1. Thyroid function is normal      2. Cholesterol is controlled      3. Stop lisinopril - due to poor kidney function      4. Come to lab for materials to do a 24 hour urine study to quantify kidney function      5. Return for lab in 7-10 days: blood test to monitor kidney function off lisinopril

## 2011-06-27 NOTE — Telephone Encounter (Signed)
Patient notified of lab results and other directions from Dr,Norins. Patient states has a appt. With nephrologist [facility] Dr. Eliott Nine; on June 20th. And usually has lab test done when sees her. Does she still need these test done if going to see her Dr. On the 20th? Please advise.

## 2011-06-27 NOTE — Telephone Encounter (Signed)
Noted that patient declines advice preferring to see Dr. Eliott Nine who will make decisions re: nephrology issues

## 2011-06-27 NOTE — Assessment & Plan Note (Signed)
Patient with progressive renal insufficiency over the past two years. She is not a candidate for Hemodialysis.  Plan - Risk modification: control of BP, vascular disease management  D/c ACE-I (lisinopril) with repeat metabolic panel in 7-10 days.  Quantify function: 24 urine for total protein and creatinine clearance

## 2011-06-27 NOTE — Assessment & Plan Note (Signed)
Lab Results  Component Value Date   TSH 0.40 06/26/2011   Free T4 normal  Plan - continue present medical regimen

## 2011-06-27 NOTE — Assessment & Plan Note (Signed)
Patient w/o abdominal pain or report of pain with eating.  Plan - follow up with [facility] as scheduled.

## 2011-06-30 ENCOUNTER — Telehealth: Payer: Self-pay | Admitting: *Deleted

## 2011-06-30 NOTE — Telephone Encounter (Signed)
Wanted to inform md power went off yesterday & didn't come back on until this am. Will have to bring 24 hour urine in on Tues.... 06/30/11@9 :35am/LMB

## 2011-06-30 NOTE — Telephone Encounter (Signed)
k

## 2011-07-03 ENCOUNTER — Other Ambulatory Visit: Payer: Medicare Other

## 2011-07-04 ENCOUNTER — Other Ambulatory Visit: Payer: Medicare Other

## 2011-07-04 DIAGNOSIS — N189 Chronic kidney disease, unspecified: Secondary | ICD-10-CM

## 2011-07-05 LAB — PROTEIN, URINE, 24 HOUR
Protein, 24H Urine: 39 mg/d — ABNORMAL LOW (ref 50–100)
Protein, Urine: 3 mg/dL

## 2011-07-06 LAB — CREATININE CLEARANCE, URINE, 24 HOUR
Creatinine Clearance: 18 mL/min — ABNORMAL LOW (ref 75–115)
Creatinine, 24H Ur: 550 mg/d — ABNORMAL LOW (ref 700–1800)
Creatinine, Urine: 42.3 mg/dL
Creatinine: 2.1 mg/dL — ABNORMAL HIGH (ref 0.50–1.10)

## 2011-07-09 ENCOUNTER — Encounter: Payer: Self-pay | Admitting: Internal Medicine

## 2011-07-12 ENCOUNTER — Ambulatory Visit (INDEPENDENT_AMBULATORY_CARE_PROVIDER_SITE_OTHER): Payer: Medicare Other | Admitting: Cardiovascular Disease

## 2011-07-12 ENCOUNTER — Encounter: Payer: Self-pay | Admitting: Cardiovascular Disease

## 2011-07-12 VITALS — BP 138/70 | HR 84 | Ht 62.0 in | Wt 129.0 lb

## 2011-07-12 DIAGNOSIS — I509 Heart failure, unspecified: Secondary | ICD-10-CM

## 2011-07-12 DIAGNOSIS — I10 Essential (primary) hypertension: Secondary | ICD-10-CM

## 2011-07-12 DIAGNOSIS — I4892 Unspecified atrial flutter: Secondary | ICD-10-CM

## 2011-07-12 DIAGNOSIS — K551 Chronic vascular disorders of intestine: Secondary | ICD-10-CM

## 2011-07-12 MED ORDER — HYDRALAZINE HCL 50 MG PO TABS: 50.0000 mg | ORAL_TABLET | Freq: Two times a day (BID) | ORAL | Status: DC

## 2011-07-12 NOTE — Patient Instructions (Addendum)
Your physician has recommended you make the following change in your medication: Decrease Hydralazine to 50mg  1 tablet twice a day.  Your physician wants you to follow-up in: 6 months with Dr. Kirke Corin. You will receive a reminder letter in the mail two months in advance. If you  don't receive a letter, please call our office to schedule the follow-up appointment.  Per Dr. Kirke Corin, we will cancel your upcoming appointment with Dr. Johney Frame.

## 2011-07-14 ENCOUNTER — Encounter: Payer: Self-pay | Admitting: Cardiovascular Disease

## 2011-07-14 NOTE — Progress Notes (Signed)
HPI  This is an 78 year old female who is here today for a followup visit. She has history of atrial flutter status post ablation in 2011. She also has congestive heart failure with mildly reduced LV systolic function. Overall, she has been doing reasonably well. She denies any chest pain, dyspnea or palpitations. She has been having problems with her balance recently and fell in August with a bruising in her back and her arm.  She was taken off anticoagulation after she maintained sinus rhythm. She has recurrent problems with mesenteric ischemia which is being managed by Dr. Myra Gianotti. Most recently she had a covered stent placement in size to previously placed stents due to recurrent restenosis. She seems to be doing reasonably well. The biggest issue at this time seems extensive bruising in the upper and lower extremities. She is on aspirin and Plavix. She also complains of slightly low blood pressure during the day. It does not go below 110. However, she feels tired when it's in that range.  Allergies  Allergen Reactions  . Amoxicillin-Pot Clavulanate Other (See Comments)     unknown  . Amoxicillin-Pot Clavulanate Diarrhea  . Clonidine Derivatives Other (See Comments)    Blood pressure dropped too low  . Moxifloxacin Other (See Comments)    REACTION: patient developed tremors and vibrato  . Sulfa Antibiotics Nausea Only  . Sulfamethoxazole Other (See Comments)    REACTION: unspecified  . Sulfonamide Derivatives Nausea Only     Current Outpatient Prescriptions on File Prior to Visit  Medication Sig Dispense Refill  . amLODipine (NORVASC) 10 MG tablet Take 10 mg by mouth daily.      Marland Kitchen aspirin 81 MG tablet Take 81 mg by mouth daily.        . clopidogrel (PLAVIX) 75 MG tablet Take 1 tablet (75 mg total) by mouth daily.  30 tablet  11  . furosemide (LASIX) 80 MG tablet Take 80 mg by mouth daily. Take 80mg  in AM and 40mg  in afternoon      . hydrALAZINE (APRESOLINE) 50 MG tablet Take 1  tablet (50 mg total) by mouth 2 (two) times daily.  90 tablet  6  . HYDROcodone-acetaminophen (NORCO) 5-325 MG per tablet Take 1 tablet by mouth 1 day or 1 dose. For pain      . hydroxychloroquine (PLAQUENIL) 200 MG tablet Take 1 tablet by mouth daily.      Marland Kitchen levothyroxine (SYNTHROID, LEVOTHROID) 75 MCG tablet TAKE 1 TABLET BY MOUTH EVERY DAY  90 tablet  1  . methotrexate 2.5 MG tablet Take 2.5 mg by mouth once a week. Takes 4 tablets  weekly      . metoprolol (LOPRESSOR) 50 MG tablet Take 1 tablet (50 mg total) by mouth 2 (two) times daily.  60 tablet  5  . Multiple Vitamin (MULTIVITAMIN) capsule Take 1 capsule by mouth daily. Taking geritol      . omeprazole (PRILOSEC) 20 MG capsule Take 20 mg by mouth daily.        . predniSONE (DELTASONE) 5 MG tablet Take 1 tablet (5 mg total) by mouth daily as needed. Gout..tapered dosage      . ULORIC 40 MG tablet Take 80 mg by mouth daily.          Past Medical History  Diagnosis Date  . Chronic kidney disease     chronic  . Cardiomyopathy     EF 40%.    Marland Kitchen Dyspepsia   . Peripheral vascular disease   .  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 Thyroid disease     hypothyroidism  . Hypertension   . Renovascular hypertension     s/p stenting by Dr Samule Ohm  . Chronic systolic dysfunction of left ventricle   . Arthritis   . CVA (cerebral vascular accident) 06/2005  . Mitral regurgitation   . Hiatal hernia   . Atrial flutter March 2011    s/p CTI ablation by Dr Ladona Ridgel  . Anemia      Past Surgical History  Procedure Date  . Cardiac catheterization   . Cholecystectomy     laporascopic  . Abdominal hysterectomy   . Oophorectomy   . Tonsillectomy   . Ras stents 2005    bilateral  . Sma stenosis     NOv '12 stented  . Eye surgery      Family History  Problem Relation Age of Onset  . Heart disease Mother   . Hypertension Father   . Diabetes Brother      History    Social History  . Marital Status: Widowed    Spouse Name: N/A    Number of Children: N/A  . Years of Education: N/A   Occupational History  . Not on file.   Social History Main Topics  . Smoking status: Never Smoker   . Smokeless tobacco: Never Used  . Alcohol Use: No  . Drug Use: No  . Sexually Active: No   Other Topics Concern  . Not on file   Social History Narrative   lives alone. I-ADL's. married 60 yrs, widowed May 28,'07. 2 miscarriages, 2 sons: '50, '53. 1 daughter: '48. 7 grandchildren     PHYSICAL EXAM   BP 138/70  Pulse 84  Ht 5\' 2"  (1.575 m)  Wt 129 lb (58.514 kg)  BMI 23.59 kg/m2  Constitutional: She is oriented to person, place, and time. She appears well-developed and well-nourished. No distress.  HENT: No nasal discharge.  Head: Normocephalic and atraumatic.  Eyes: Pupils are equal and round. Right eye exhibits no discharge. Left eye exhibits no discharge.  Neck: Normal range of motion. Neck supple. No JVD present. No thyromegaly present.  Cardiovascular: Normal rate, regular rhythm with frequent premature beats, normal heart sounds. Exam reveals no gallop and no friction rub. No murmur heard.  Pulmonary/Chest: Effort normal and breath sounds normal. No stridor. No respiratory distress. She has no wheezes. She has no rales. She exhibits no tenderness.  Abdominal: Soft. Bowel sounds are normal. She exhibits no distension. There is no tenderness. There is no rebound and no guarding.  Musculoskeletal: Normal range of motion. She exhibits no edema and no tenderness.  Neurological: She is alert and oriented to person, place, and time. Coordination normal.  Skin: Skin is warm and dry. Extensive bruising in the upper and lower extremities. She is not diaphoretic. No erythema. No pallor.  Psychiatric: She has a normal mood and affect. Her behavior is normal. Judgment and thought content normal.      ASSESSMENT AND PLAN

## 2011-07-14 NOTE — Assessment & Plan Note (Signed)
Her most recent ejection fraction was 40%. Continue medical therapy. She is already on a beta blocker and an ACE inhibitor. Her chronic kidney disease appears to be stable.

## 2011-07-14 NOTE — Assessment & Plan Note (Signed)
Status post endovascular repair of the SMA stenosis by Dr. Myra Gianotti. Most recently treated with a covered stent. The patient asked if she can come off Plavix. I asked her to discuss that with Dr. Myra Gianotti. She might need to stay on this indefinitely given her multiple stents in the SMA.

## 2011-07-14 NOTE — Assessment & Plan Note (Signed)
She has been having slightly low blood pressure readings during the day. I asked her to change hydralazine to twice a day instead of 3 times a day.

## 2011-07-14 NOTE — Assessment & Plan Note (Signed)
She is status post an ablation procedure in 2011 without recurrent arrhythmia. She still has frequent premature beats. She is off amiodarone. Continue medical therapy. No anticoagulation is indicated.

## 2011-07-17 ENCOUNTER — Other Ambulatory Visit: Payer: Medicare Other

## 2011-07-17 ENCOUNTER — Ambulatory Visit: Payer: Medicare Other | Admitting: Surgery

## 2011-07-21 ENCOUNTER — Encounter: Payer: Self-pay | Admitting: Neurosurgery

## 2011-07-24 ENCOUNTER — Ambulatory Visit (INDEPENDENT_AMBULATORY_CARE_PROVIDER_SITE_OTHER): Payer: Medicare Other | Admitting: Neurosurgery

## 2011-07-24 ENCOUNTER — Other Ambulatory Visit (INDEPENDENT_AMBULATORY_CARE_PROVIDER_SITE_OTHER): Payer: Medicare Other | Admitting: *Deleted

## 2011-07-24 ENCOUNTER — Encounter: Payer: Self-pay | Admitting: Neurosurgery

## 2011-07-24 VITALS — BP 175/75 | HR 75 | Resp 14 | Ht 62.0 in | Wt 129.7 lb

## 2011-07-24 DIAGNOSIS — K551 Chronic vascular disorders of intestine: Secondary | ICD-10-CM

## 2011-07-24 DIAGNOSIS — Z48812 Encounter for surgical aftercare following surgery on the circulatory system: Secondary | ICD-10-CM

## 2011-07-24 NOTE — Progress Notes (Signed)
[facility] Office Note  CC: Six-month mesenteric arterial duplex Referring Physician: Brabham  History of Present Illness: 78 year old female patient with a history of mesenteric standing 01/26/2010. The patient reports no abdominal pain no food fear and no problems with bowel or bladder function. The patient is ambulating independently and still lives alone and is able to function and take care of her ADLs.  Past Medical History  Diagnosis Date  . Chronic kidney disease     chronic  . Cardiomyopathy     EF 40%.    Marland Kitchen Dyspepsia   . Peripheral vascular disease   . 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 Thyroid disease     hypothyroidism  . Hypertension   . Renovascular hypertension     s/p stenting by Dr Samule Ohm  . Chronic systolic dysfunction of left ventricle   . Arthritis   . CVA (cerebral vascular accident) 06/2005  . Mitral regurgitation   . Hiatal hernia   . Atrial flutter March 2011    s/p CTI ablation by Dr Ladona Ridgel  . Anemia     ROS: [x]  Positive   [ ]  Denies    General: [ ]  Weight loss, [ ]  Fever, [ ]  chills Neurologic: [ ]  Dizziness, [ ]  Blackouts, [ ]  Seizure [ ]  Stroke, [ ]  "Mini stroke", [ ]  Slurred speech, [ ]  Temporary blindness; [ ]  weakness in arms or legs, [ ]  Hoarseness Cardiac: [ ]  Chest pain/pressure, [ ]  Shortness of breath at rest [ ]  Shortness of breath with exertion, [ ]  Atrial fibrillation or irregular heartbeat Vascular: [ ]  Pain in legs with walking, [ ]  Pain in legs at rest, [ ]  Pain in legs at night,  [ ]  Non-healing ulcer, [ ]  Blood clot in vein/DVT,   Pulmonary: [ ]  Home oxygen, [ ]  Productive cough, [ ]  Coughing up blood, [ ]  Asthma,  [ ]  Wheezing Musculoskeletal:  [ ]  Arthritis, [ ]  Low back pain, [ ]  Joint pain Hematologic: [ ]  Easy Bruising, [ ]  Anemia; [ ]  Hepatitis Gastrointestinal: [ ]  Blood in stool, [ ]  Gastroesophageal  Reflux/heartburn, [ ]  Trouble swallowing Urinary: [ ]  chronic Kidney disease, [ ]  on HD - [ ]  MWF or [ ]  TTHS, [ ]  Burning with urination, [ ]  Difficulty urinating Skin: [ ]  Rashes, [ ]  Wounds Psychological: [ ]  Anxiety, [ ]  Depression   Social History History  Substance Use Topics  . Smoking status: Never Smoker   . Smokeless tobacco: Never Used  . Alcohol Use: No    Family History Family History  Problem Relation Age of Onset  . Heart disease Mother   . Hypertension Father   . Diabetes Brother     Allergies  Allergen Reactions  . Amoxicillin-Pot Clavulanate Other (See Comments)     unknown  . Amoxicillin-Pot Clavulanate Diarrhea  . Clonidine Derivatives Other (See Comments)    Blood pressure dropped too low  . Moxifloxacin Other (See Comments)    REACTION: patient developed tremors and vibrato  . Sulfa Antibiotics Nausea Only  . Sulfamethoxazole Other (See Comments)    REACTION: unspecified  . Sulfonamide Derivatives Nausea Only    Current Outpatient Prescriptions  Medication Sig Dispense Refill  . amLODipine (NORVASC) 10 MG tablet Take 10 mg by mouth daily.      Marland Kitchen aspirin 81 MG tablet Take 81 mg by mouth daily.        Marland Kitchen  Cholecalciferol (VITAMIN D3) 3000 UNITS TABS Take 100 mg by mouth daily.      . clopidogrel (PLAVIX) 75 MG tablet Take 1 tablet (75 mg total) by mouth daily.  30 tablet  11  . folic acid (FOLVITE) 1 MG tablet Take 1 mg by mouth daily.      . furosemide (LASIX) 80 MG tablet Take 80 mg by mouth daily. Take 80mg  in AM and 40mg  in afternoon      . hydrALAZINE (APRESOLINE) 50 MG tablet Take 1 tablet (50 mg total) by mouth 2 (two) times daily.  90 tablet  6  . HYDROcodone-acetaminophen (NORCO) 5-325 MG per tablet Take 1 tablet by mouth 1 day or 1 dose. For pain      . hydroxychloroquine (PLAQUENIL) 200 MG tablet Take 1 tablet by mouth daily.      Marland Kitchen levothyroxine (SYNTHROID, LEVOTHROID) 75 MCG tablet TAKE 1 TABLET BY MOUTH EVERY DAY  90 tablet  1  .  methotrexate 2.5 MG tablet Take 2.5 mg by mouth once a week. Takes 4 tablets  weekly      . metoprolol (LOPRESSOR) 50 MG tablet Take 1 tablet (50 mg total) by mouth 2 (two) times daily.  60 tablet  5  . Multiple Vitamin (MULTIVITAMIN) capsule Take 1 capsule by mouth daily. Taking geritol      . omeprazole (PRILOSEC) 20 MG capsule Take 20 mg by mouth daily.        . predniSONE (DELTASONE) 5 MG tablet Take 1 tablet (5 mg total) by mouth daily as needed. Gout..tapered dosage      . ULORIC 40 MG tablet Take 80 mg by mouth daily.         Physical Examination  Filed Vitals:   07/24/11 0940  BP: 175/75  Pulse: 75  Resp: 14    Body mass index is 23.72 kg/(m^2).  General:  WDWN in NAD Gait: Normal HEENT: WNL Eyes: Pupils equal Pulmonary: normal non-labored breathing , without Rales, rhonchi,  wheezing Cardiac: RRR, without  Murmurs, rubs or gallops; No carotid bruits Abdomen: soft, NT, no masses Skin: no rashes, ulcers noted Vascular Exam/Pulses: Lower extremity pulses heard with Doppler only, femoral pulses are palpable but faint  Extremities without ischemic changes, no Gangrene , no cellulitis; no open wounds;  Musculoskeletal: no muscle wasting or atrophy  Neurologic: A&O X 3; Appropriate Affect ; SENSATION: normal; MOTOR FUNCTION:  moving all extremities equally. Speech is fluent/normal  Non-Invasive Vascular Imaging: Mesenteric arterial duplex shows some elevations in the superior mesenteric artery and this was reviewed with Dr. Myra Gianotti who states the patient should have repeat studies in 6 months.  ASSESSMENT/PLAN: Asymptomatic patient with history of mesenteric artery serial stenting, SMA  April 2012. The patient will followup in 6 months with repeat mesenteric arterial duplex. Her questions were encouraged and answered.  Lauree Chandler ANP  Clinic M.D.: Myra Gianotti

## 2011-07-31 ENCOUNTER — Ambulatory Visit: Payer: Medicare Other | Admitting: Internal Medicine

## 2011-07-31 NOTE — Procedures (Unsigned)
MESENTERIC ARTERIAL DUPLEX EVALUATION  INDICATION:  SMA stent placed 05/10/2010  HISTORY: Diabetes:  No Cardiac:  Yes Hypertension:  Yes Smoking:  No  Mesenteric Duplex Findings: Aorta - Proximal                            102 Aorta - Mid                                 140 Aorta - Distal                              126  Celiac Trunk - Proximal                     273 Celiac Trunk - Distal                       155  Hepatic Artery                              114 Splenic Artery                              99  Superior Mesenteric Artery-Origin           339 Superior Mesenteric Artery-Proximal         328 Superior Mesenteric Artery-Mid              188 Superior Mesenteric Artery-Distal           136  Inferior Mesenteric Artery-Proximal  IMPRESSION: 1. Elevated velocities are observed in the aorta with known     atherosclerosis. 2. Elevated velocities are present in the celiac trunk with     poststenotic turbulence observed. 3. Elevated velocities are present in the SMA stent without     significant turbulence distally.  Increased velocities may be due     to stent rigidity; however, disease cannot be ruled out due to     limited 2D visualization.  Evaluation was performed using mainly     color flow imaging.  ___________________________________________ V. Charlena Cross, MD  LT/MEDQ  D:  07/24/2011  T:  07/24/2011  Job:  [id number]

## 2011-08-03 ENCOUNTER — Other Ambulatory Visit: Payer: Self-pay | Admitting: *Deleted

## 2011-08-03 MED ORDER — METOPROLOL TARTRATE 50 MG PO TABS: 50.0000 mg | ORAL_TABLET | Freq: Two times a day (BID) | ORAL | Status: DC

## 2011-09-12 ENCOUNTER — Ambulatory Visit: Payer: Medicare Other | Admitting: Cardiovascular Disease

## 2011-09-19 ENCOUNTER — Ambulatory Visit (INDEPENDENT_AMBULATORY_CARE_PROVIDER_SITE_OTHER): Payer: Medicare Other | Admitting: Cardiovascular Disease

## 2011-09-19 ENCOUNTER — Encounter: Payer: Self-pay | Admitting: Cardiovascular Disease

## 2011-09-19 VITALS — BP 140/62 | HR 66 | Ht 62.0 in | Wt 125.2 lb

## 2011-09-19 DIAGNOSIS — W19XXXA Unspecified fall, initial encounter: Secondary | ICD-10-CM

## 2011-09-19 DIAGNOSIS — R0602 Shortness of breath: Secondary | ICD-10-CM

## 2011-09-19 DIAGNOSIS — I509 Heart failure, unspecified: Secondary | ICD-10-CM

## 2011-09-19 DIAGNOSIS — I4892 Unspecified atrial flutter: Secondary | ICD-10-CM

## 2011-09-19 NOTE — Patient Instructions (Addendum)
Use support stockings: knee high/Medium pressure.  Continue same medications.  I will check with Dr. Myra Gianotti if Aspirin can be stopped.  Follow up in 3 months.

## 2011-09-21 DIAGNOSIS — W19XXXA Unspecified fall, initial encounter: Secondary | ICD-10-CM | POA: Insufficient documentation

## 2011-09-21 DIAGNOSIS — R296 Repeated falls: Secondary | ICD-10-CM | POA: Insufficient documentation

## 2011-09-21 NOTE — Progress Notes (Signed)
HPI  This is an 78 year old female who is here today for a followup visit. She has history of atrial flutter status post ablation in 2011. She also has congestive heart failure with mildly reduced LV systolic function. Overall, she has been doing reasonably well. She complains of mild increase in dyspnea with increased lower extremity edema which is worse at the end of the day. She has been having problems with her balance. She fell 3 times in the last few months significant bruising.  She was taken off anticoagulation after she maintained sinus rhythm. She has recurrent problems with mesenteric ischemia which is being managed by Dr. Myra Gianotti.   Allergies  Allergen Reactions  . Amoxicillin-Pot Clavulanate Other (See Comments)     unknown  . Amoxicillin-Pot Clavulanate Diarrhea  . Clonidine Derivatives Other (See Comments)    Blood pressure dropped too low  . Moxifloxacin Other (See Comments)    REACTION: patient developed tremors and vibrato  . Sulfa Antibiotics Nausea Only  . Sulfamethoxazole Other (See Comments)    REACTION: unspecified  . Sulfonamide Derivatives Nausea Only     Current Outpatient Prescriptions on File Prior to Visit  Medication Sig Dispense Refill  . amLODipine (NORVASC) 10 MG tablet Take 10 mg by mouth daily.      Marland Kitchen aspirin 81 MG tablet Take 81 mg by mouth daily.        . Cholecalciferol (VITAMIN D3) 3000 UNITS TABS Take 100 mg by mouth daily.      . clopidogrel (PLAVIX) 75 MG tablet Take 1 tablet (75 mg total) by mouth daily.  30 tablet  11  . folic acid (FOLVITE) 1 MG tablet Take 1 mg by mouth daily.      . furosemide (LASIX) 80 MG tablet Take 80 mg by mouth daily. Take 80mg  in AM and 40mg  in afternoon      . hydrALAZINE (APRESOLINE) 50 MG tablet Take 1 tablet (50 mg total) by mouth 2 (two) times daily.  90 tablet  6  . hydroxychloroquine (PLAQUENIL) 200 MG tablet Take 1 tablet by mouth daily.      Marland Kitchen levothyroxine (SYNTHROID, LEVOTHROID) 75 MCG tablet TAKE 1  TABLET BY MOUTH EVERY DAY  90 tablet  1  . methotrexate 2.5 MG tablet Take 2.5 mg by mouth once a week. Takes 4 tablets  weekly      . metoprolol (LOPRESSOR) 50 MG tablet Take 1 tablet (50 mg total) by mouth 2 (two) times daily.  60 tablet  9  . Multiple Vitamin (MULTIVITAMIN) capsule Take 1 capsule by mouth daily. Taking geritol      . omeprazole (PRILOSEC) 20 MG capsule Take 20 mg by mouth daily.        . predniSONE (DELTASONE) 5 MG tablet Take 1 tablet (5 mg total) by mouth daily as needed. Gout..tapered dosage      . ULORIC 40 MG tablet Take 80 mg by mouth daily.          Past Medical History  Diagnosis Date  . Chronic kidney disease     chronic  . Cardiomyopathy     EF 40%.    Marland Kitchen Dyspepsia   . Peripheral vascular disease   . 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 Thyroid disease     hypothyroidism  . Hypertension   . Renovascular hypertension     s/p stenting by Dr Samule Ohm  . Chronic systolic  dysfunction of left ventricle   . Arthritis   . CVA (cerebral vascular accident) 06/2005  . Mitral regurgitation   . Hiatal hernia   . Atrial flutter March 2011    s/p CTI ablation by Dr Ladona Ridgel  . Anemia      Past Surgical History  Procedure Date  . Cardiac catheterization   . Cholecystectomy     laporascopic  . Abdominal hysterectomy   . Oophorectomy   . Tonsillectomy   . Ras stents 2005    bilateral  . Sma stenosis     NOv '12 stented  . Eye surgery      Family History  Problem Relation Age of Onset  . Heart disease Mother   . Hypertension Father   . Diabetes Brother      History   Social History  . Marital Status: Widowed    Spouse Name: N/A    Number of Children: N/A  . Years of Education: N/A   Occupational History  . Not on file.   Social History Main Topics  . Smoking status: Never Smoker   . Smokeless tobacco: Never Used  . Alcohol Use: No  . Drug Use: No  . Sexually Active: No     Other Topics Concern  . Not on file   Social History Narrative   lives alone. I-ADL's. married 60 yrs, widowed May 28,'07. 2 miscarriages, 2 sons: '50, '53. 1 daughter: '48. 7 grandchildren     PHYSICAL EXAM   BP 140/62  Pulse 66  Ht 5\' 2"  (1.575 m)  Wt 125 lb 4 oz (56.813 kg)  BMI 22.91 kg/m2  Constitutional: She is oriented to person, place, and time. She appears well-developed and well-nourished. No distress.  HENT: No nasal discharge.  Head: Normocephalic and atraumatic.  Eyes: Pupils are equal and round. Right eye exhibits no discharge. Left eye exhibits no discharge.  Neck: Normal range of motion. Neck supple. No JVD present. No thyromegaly present.  Cardiovascular: Normal rate, regular rhythm with premature beats, normal heart sounds. Exam reveals no gallop and no friction rub. No murmur heard.  Pulmonary/Chest: Effort normal and breath sounds normal. No stridor. No respiratory distress. She has no wheezes. She has no rales. She exhibits no tenderness.  Abdominal: Soft. Bowel sounds are normal. She exhibits no distension. There is no tenderness. There is no rebound and no guarding.  Musculoskeletal: Normal range of motion. She exhibits no edema and no tenderness.  Neurological: She is alert and oriented to person, place, and time. Coordination normal.  Skin: Skin is warm and dry. No rash noted but with extensive bruising. She is not diaphoretic. No erythema. No pallor.  Psychiatric: She has a normal mood and affect. Her behavior is normal. Judgment and thought content normal.    EKG: Sinus  Rhythm  -With rate variation  cv = 10. -RSR(V1) -nondiagnostic.   PROBABLY NORMAL   ASSESSMENT AND PLAN

## 2011-09-21 NOTE — Assessment & Plan Note (Signed)
The patient reports frequent falls over the last few months without syncope. I will check with Dr. Myra Gianotti to see if aspirin can be stopped with continued Plavix. I am concerned about the extensive bruising that she has now especially with her recurrent falls recently.

## 2011-09-21 NOTE — Assessment & Plan Note (Signed)
She appears to be slightly fluid overloaded but I am hesitant to increase her diuretic dose. Most of her symptoms seems to be increased lower extremity edema. I advised her to perform leg elevation frequently during the day and start wearing support stockings.

## 2011-09-21 NOTE — Assessment & Plan Note (Signed)
Status post ablation. No recurrent arrhythmia. Continue treatment with metoprolol.

## 2011-09-29 ENCOUNTER — Telehealth: Payer: Self-pay | Admitting: Internal Medicine

## 2011-09-29 NOTE — Telephone Encounter (Signed)
Pt fell on Aug 22.  She had x-rays at an urgent care in [place] a week later.  No fracture showed in the x-ray.  She is still having pain in the right of coccyx.  She would like to be worked in on [employer] or Wed of next week.

## 2011-09-30 NOTE — Telephone Encounter (Signed)
K

## 2011-10-02 ENCOUNTER — Telehealth: Payer: Self-pay | Admitting: Internal Medicine

## 2011-10-02 NOTE — Telephone Encounter (Signed)
Pt is coming Tues Sept 17 in the AM.

## 2011-10-02 NOTE — Telephone Encounter (Signed)
Caller: [name]/Patient; Patient Name: Elizabeth Wall; PCP: Illene Regulus (Adults only); Best Callback Phone Number: [phone]. Called for appointment for follow up to fall 09/07/11.  Sat down hard on floor in kitchen after tripping on rug.  Seen at [facility]; xrays showed no pelvic or sacral fractures.  Continues to have right pelvic pain with movement.  BP not taken yet today; was 120/70 10/01/11. Afebrile/tactile.  Reports this fall is 3rd time in 2013.  Advised to see MD within 4 hours for new onset of falling and frail elderly person per Falls guideline. Unable to get ride to office until 09/02/11. Verified appointment already scheduled for 09/02/11 at 1000 with Dr Debby Bud.

## 2011-10-03 ENCOUNTER — Ambulatory Visit (INDEPENDENT_AMBULATORY_CARE_PROVIDER_SITE_OTHER): Payer: Medicare Other | Admitting: Internal Medicine

## 2011-10-03 ENCOUNTER — Encounter: Payer: Self-pay | Admitting: Internal Medicine

## 2011-10-03 VITALS — BP 162/70 | HR 79 | Temp 98.5°F | Resp 16 | Wt 126.0 lb

## 2011-10-03 DIAGNOSIS — Z23 Encounter for immunization: Secondary | ICD-10-CM

## 2011-10-03 DIAGNOSIS — R42 Dizziness and giddiness: Secondary | ICD-10-CM

## 2011-10-03 DIAGNOSIS — W19XXXA Unspecified fall, initial encounter: Secondary | ICD-10-CM

## 2011-10-03 NOTE — Patient Instructions (Addendum)
1. Pain your groin and hip - you sprained a muscle in your groin and hip. Use topical analgesics such as icy-hot or Beng[place] over the painful areas. We would like you to try aleve to manage the pain instead of the hydrocodone. This will get better in time.  2. Falling - You have positional vertigo. It is a very common problem. You need to be aware of this condition when you are getting up and walking. When you get up give yourself 20 seconds of standing in place before walking forward. I attached an article about this for you to read at your leisure.  Benign Positional Vertigo Vertigo means you feel like you or your surroundings are moving when they are not. Benign positional vertigo is the most common form of vertigo. Benign means that the cause of your condition is not serious. Benign positional vertigo is more common in older adults. CAUSES   Benign positional vertigo is the result of an upset in the labyrinth system. This is an area in the middle ear that helps control your balance. This may be caused by a viral infection, head injury, or repetitive motion. However, often no specific cause is found. SYMPTOMS   Symptoms of benign positional vertigo occur when you move your head or eyes in different directions. Some of the symptoms may include:  Loss of balance and falls.   Vomiting.   Blurred vision.   Dizziness.   Nausea.   Involuntary eye movements (nystagmus).  DIAGNOSIS   Benign positional vertigo is usually diagnosed by physical exam. If the specific cause of your benign positional vertigo is unknown, your caregiver may perform imaging tests, such as magnetic resonance imaging (MRI) or computed tomography (CT). TREATMENT   Your caregiver may recommend movements or procedures to correct the benign positional vertigo. Medicines such as meclizine, benzodiazepines, and medicines for nausea may be used to treat your symptoms. In rare cases, if your symptoms are caused by certain conditions  that affect the inner ear, you may need surgery. HOME CARE INSTRUCTIONS    Follow your caregiver's instructions.   Move slowly. Do not make sudden body or head movements.   Avoid driving.   Avoid operating heavy machinery.   Avoid performing any tasks that would be dangerous to you or others during a vertigo episode.   Drink enough fluids to keep your urine clear or pale yellow.  SEEK IMMEDIATE MEDICAL CARE IF:    You develop problems with walking, weakness, numbness, or using your arms, hands, or legs.   You have difficulty speaking.   You develop severe headaches.   Your nausea or vomiting continues or gets worse.   You develop visual changes.   Your family or friends notice any behavioral changes.   Your condition gets worse.   You have a fever.   You develop a stiff neck or sensitivity to light.  MAKE SURE YOU:    Understand these instructions.   Will watch your condition.   Will get help right away if you are not doing well or get worse.  Document Released: 10/10/2005 Document Revised: 12/22/2010 Document Reviewed: 09/22/2010 [facility] Patient Information 2012 [place], [place].

## 2011-10-03 NOTE — Progress Notes (Signed)
Subjective:     Patient ID: [place], female   DOB: 1919/04/29, 78 y.o.   MRN: [id number]  HPI Comments: [place] Wall is an 78 yo female who has multiple medical problems and has fallen three times in the past year who has come in for evaluation of pain in her groin. She reports that she fell on 09/07/2011 while standing on a rug with her back to a sink inside her home. Her head hit a cabinet, and she fell on her right side. The pain began at her tailbone and moved to the right anterior groin. The pain began at the same time as the fall and has been the same since she fell. It is a tearing pain that is an 8/10 andpresent only when she stands up or bends over. She  She takes hydrocodone for the pain which provides her relief, but makes her drowsy. She has been seen by urgent care on 09/17/2011 and received x-rays of her lumbar spine which she reports were negative for fracture. Her fall was not proceeded by any noticeable changes in heart rate or lightheadedness. She reports that her foot snagged the rug she was standing on which made her fall. She states that she has no changes in vision or hearing.  Past Medical History  Diagnosis Date  . Chronic kidney disease     chronic  . Cardiomyopathy     EF 40%.    Marland Kitchen Dyspepsia   . Peripheral vascular disease   . 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 Thyroid disease     hypothyroidism  . Hypertension   . Renovascular hypertension     s/p stenting by Dr Samule Ohm  . Chronic systolic dysfunction of left ventricle   . Arthritis   . CVA (cerebral vascular accident) 06/2005  . Mitral regurgitation   . Hiatal hernia   . Atrial flutter March 2011    s/p CTI ablation by Dr Ladona Ridgel  . Anemia    Past Surgical History  Procedure Date  . Cardiac catheterization   . Cholecystectomy     laporascopic  . Abdominal hysterectomy   . Oophorectomy   . Tonsillectomy   . Ras stents 2005   bilateral  . Sma stenosis     NOv '12 stented  . Eye surgery    Family History  Problem Relation Age of Onset  . Heart disease Mother   . Hypertension Father   . Diabetes Brother    History   Social History  . Marital Status: Widowed    Spouse Name: N/A    Number of Children: N/A  . Years of Education: N/A   Occupational History  . Not on file.   Social History Main Topics  . Smoking status: Never Smoker   . Smokeless tobacco: Never Used  . Alcohol Use: No  . Drug Use: No  . Sexually Active: No   Other Topics Concern  . Not on file   Social History Narrative   lives alone. I-ADL's. married 60 yrs, widowed May 28,'07. 2 miscarriages, 2 sons: '50, '53. 1 daughter: '48. 7 grandchildren. Patient is able to perform all activities of daily living which include dressing, feeding, hygiene, toileting, shopping, and managing her finances without assistance.    Current Outpatient Prescriptions on File Prior to Visit  Medication Sig Dispense Refill  . amLODipine (NORVASC) 10 MG tablet Take 10 mg by mouth daily.      Marland Kitchen  aspirin 81 MG tablet Take 81 mg by mouth daily.        . Cholecalciferol (VITAMIN D3) 3000 UNITS TABS Take 100 mg by mouth daily.      . clopidogrel (PLAVIX) 75 MG tablet Take 1 tablet (75 mg total) by mouth daily.  30 tablet  11  . folic acid (FOLVITE) 1 MG tablet Take 1 mg by mouth daily.      . furosemide (LASIX) 80 MG tablet Take 80 mg by mouth daily. Take 80mg  in AM and 40mg  in afternoon      . hydrALAZINE (APRESOLINE) 50 MG tablet Take 1 tablet (50 mg total) by mouth 2 (two) times daily.  90 tablet  6  . HYDROcodone-acetaminophen (VICODIN) 5-500 MG per tablet Take 1 tablet by mouth every 6 (six) hours as needed.      . hydroxychloroquine (PLAQUENIL) 200 MG tablet Take 1 tablet by mouth daily.      Marland Kitchen levothyroxine (SYNTHROID, LEVOTHROID) 75 MCG tablet TAKE 1 TABLET BY MOUTH EVERY DAY  90 tablet  1  . methotrexate 2.5 MG tablet Take 2.5 mg by mouth once a week.  Takes 4 tablets  weekly      . metoprolol (LOPRESSOR) 50 MG tablet Take 1 tablet (50 mg total) by mouth 2 (two) times daily.  60 tablet  9  . Multiple Vitamin (MULTIVITAMIN) capsule Take 1 capsule by mouth daily. Taking geritol      . omeprazole (PRILOSEC) 20 MG capsule Take 20 mg by mouth daily.        . predniSONE (DELTASONE) 5 MG tablet Take 1 tablet (5 mg total) by mouth daily as needed. Gout..tapered dosage      . ULORIC 40 MG tablet Take 80 mg by mouth daily.          Review of Systems  All other systems reviewed and are negative.       Objective:   Physical Exam  Nursing note and vitals reviewed. Constitutional: She is oriented to person, place, and time. No distress.       Patient had a difficult time using the step to sit on the examination table.  HENT:  Head: Atraumatic.  Eyes: Pupils are equal, round, and reactive to light.  Neck: Normal range of motion. Neck supple. No thyromegaly present.  Cardiovascular: Normal rate, normal heart sounds and intact distal pulses.  Exam reveals no gallop and no friction rub.   No murmur heard.      Irregular rhythm.   Pulmonary/Chest: Effort normal and breath sounds normal. No respiratory distress. She has no wheezes. She has no rales.  Abdominal: Soft. Bowel sounds are normal. She exhibits no distension and no mass. There is no tenderness. There is no guarding.  Musculoskeletal:       Abduction of right arm limited to 90 degrees. All muscle strength 5/5 except with leg flexion which is a 4/5. 1+ pitting edema in ankles bl.  Spine is non-tender to palpation with no bony anomalies. Patient is able to get out of chair using hands. No pain on passive leg raise, hip abduction, or rotation. Patient unable to complete a leg raise on right.  Tender to palpation on right hip superior to greater trochanter.  Neurological: She is alert and oriented to person, place, and time. No cranial nerve deficit (CN 2-12 intact.).       Romberg test was  negative. Cerebellar testing negative. Unable to perform tandem gait. Regular gait favored right side.  Patellar  reflexes intact bl.  Skin: She is not diaphoretic.       Multiple ecchymosis on arms.  Psychiatric: She has a normal mood and affect. Her behavior is normal. Judgment and thought content normal.   Filed Vitals:   10/03/11 1006  BP: 162/70  Pulse: 79  Temp: 98.5 F (36.9 C)  Resp: 16        Assessment:     1. Muscle Sprain in Groin and Hip - Patient sprained muscles in her groin and hip.  PLAN - This will heal on its own. For the pain the patient should use NSAIDs such as aleve for  pain management and avoid hydrocodone. 2. Falls - Likely cause by positional vertigo given lack of neurological and cardiac findings.  PLAN - This is a benign condition. The patient was informed of the nature of this condition and  was told to wait before walking after standing up.    Attending note: patient interviewed and examined. Agree with Mr. Areta Haber assessment and plan.

## 2011-10-06 ENCOUNTER — Other Ambulatory Visit: Payer: Self-pay | Admitting: Cardiovascular Disease

## 2011-10-06 MED ORDER — AMLODIPINE BESYLATE 10 MG PO TABS: 10.0000 mg | ORAL_TABLET | Freq: Every day | ORAL | Status: DC

## 2011-10-06 NOTE — Telephone Encounter (Signed)
90 day

## 2011-10-07 NOTE — Assessment & Plan Note (Signed)
Has had recent falls. Fortunately no major injury. Current visit with right groin strain but no orthopedic injury to right hip after carefull exam.  Plan - patient to exercise all possible caution to prevent further falls.

## 2011-10-07 NOTE — Assessment & Plan Note (Signed)
History and evaluation are c/w positional vertigo.  Plan - patient is instructed in "the rule of 20."

## 2011-10-09 ENCOUNTER — Other Ambulatory Visit: Payer: Self-pay

## 2011-10-09 MED ORDER — AMLODIPINE BESYLATE 10 MG PO TABS: 10.0000 mg | ORAL_TABLET | Freq: Every day | ORAL | Status: DC

## 2011-10-09 NOTE — Telephone Encounter (Signed)
Refill sent for amlodipine 10 mg   

## 2011-10-26 ENCOUNTER — Telehealth: Payer: Self-pay | Admitting: Cardiovascular Disease

## 2011-10-26 ENCOUNTER — Other Ambulatory Visit: Payer: Self-pay

## 2011-10-26 DIAGNOSIS — I509 Heart failure, unspecified: Secondary | ICD-10-CM

## 2011-10-26 DIAGNOSIS — Z8679 Personal history of other diseases of the circulatory system: Secondary | ICD-10-CM

## 2011-10-26 MED ORDER — HYDRALAZINE HCL 50 MG PO TABS: 50.0000 mg | ORAL_TABLET | Freq: Two times a day (BID) | ORAL | Status: DC

## 2011-10-26 NOTE — Telephone Encounter (Signed)
Pt wants to know if she needs her primary to write a script for a rolling walker or if Kirke Corin can do it?? She said she would call Dr Debby Bud also about this

## 2011-10-26 NOTE — Telephone Encounter (Signed)
Ok please help generate DME Rx

## 2011-10-26 NOTE — Telephone Encounter (Signed)
Dr Kirke Corin is out of the office this week.  Pt wondering if Dr Debby Bud can write the rx for her?

## 2011-10-27 ENCOUNTER — Other Ambulatory Visit: Payer: Self-pay | Admitting: *Deleted

## 2011-10-27 MED ORDER — HYDRALAZINE HCL 50 MG PO TABS: 50.0000 mg | ORAL_TABLET | Freq: Two times a day (BID) | ORAL | Status: DC

## 2011-10-27 NOTE — Telephone Encounter (Signed)
Fax Received. Refill Completed. Bela Bonaparte Chowoe (R.M.A)   

## 2011-10-27 NOTE — Telephone Encounter (Signed)
Printed and placed on MD's desk for signature

## 2011-11-10 ENCOUNTER — Other Ambulatory Visit: Payer: Self-pay | Admitting: Internal Medicine

## 2011-11-30 ENCOUNTER — Other Ambulatory Visit: Payer: Self-pay

## 2011-12-19 ENCOUNTER — Ambulatory Visit (INDEPENDENT_AMBULATORY_CARE_PROVIDER_SITE_OTHER): Payer: Medicare Other | Admitting: Cardiovascular Disease

## 2011-12-19 ENCOUNTER — Encounter: Payer: Self-pay | Admitting: Cardiovascular Disease

## 2011-12-19 VITALS — BP 124/68 | HR 76 | Ht 62.0 in | Wt 129.5 lb

## 2011-12-19 DIAGNOSIS — R0602 Shortness of breath: Secondary | ICD-10-CM

## 2011-12-19 DIAGNOSIS — I509 Heart failure, unspecified: Secondary | ICD-10-CM

## 2011-12-19 DIAGNOSIS — R000 Tachycardia, unspecified: Secondary | ICD-10-CM

## 2011-12-19 DIAGNOSIS — I4892 Unspecified atrial flutter: Secondary | ICD-10-CM

## 2011-12-19 MED ORDER — CLOPIDOGREL BISULFATE 75 MG PO TABS: 75.0000 mg | ORAL_TABLET | Freq: Every day | ORAL | Status: DC

## 2011-12-19 NOTE — Assessment & Plan Note (Signed)
No recurrent episodes since ablation. EKG shows PACs. Continue treatment with metoprolol.

## 2011-12-19 NOTE — Patient Instructions (Addendum)
Continue same medications.  Follow up in 6 months.  

## 2011-12-19 NOTE — Assessment & Plan Note (Signed)
She appears to be mildly fluid overloaded mostly with lower extremity edema. She has chronic kidney disease and has been stable on current dose of furosemide. No changes will be made.

## 2011-12-19 NOTE — Progress Notes (Signed)
HPI  This is an 78 year old female who is here today for a followup visit. She has history of atrial flutter status post ablation in 2011. She also has congestive heart failure with mildly reduced LV systolic function. Overall, she has been doing reasonably well. Chronic exertional dyspnea seems to be stable. She has chronic lower extremity edema as well. She had frequent falls about 3-4 months ago. She has not had any falls since the last visit after reconstructing her driveway.  She was taken off anticoagulation after she maintained sinus rhythm. She has recurrent problems with mesenteric ischemia which is being managed by Dr. Myra Gianotti.   Allergies  Allergen Reactions  . Amoxicillin-Pot Clavulanate Other (See Comments)     unknown  . Amoxicillin-Pot Clavulanate Diarrhea  . Clonidine Derivatives Other (See Comments)    Blood pressure dropped too low  . Moxifloxacin Other (See Comments)    REACTION: patient developed tremors and vibrato  . Sulfa Antibiotics Nausea Only  . Sulfamethoxazole Other (See Comments)    REACTION: unspecified  . Sulfonamide Derivatives Nausea Only     Current Outpatient Prescriptions on File Prior to Visit  Medication Sig Dispense Refill  . amLODipine (NORVASC) 10 MG tablet Take 1 tablet (10 mg total) by mouth daily.  90 tablet  3  . aspirin 81 MG tablet Take 81 mg by mouth daily.        . Cholecalciferol (VITAMIN D3) 3000 UNITS TABS Take 100 mg by mouth daily.      . folic acid (FOLVITE) 1 MG tablet Take 1 mg by mouth daily.      . furosemide (LASIX) 80 MG tablet Take 80 mg by mouth daily. Take 80mg  in AM and 40mg  in afternoon      . hydrALAZINE (APRESOLINE) 50 MG tablet Take 1 tablet (50 mg total) by mouth 2 (two) times daily.  90 tablet  4  . HYDROcodone-acetaminophen (VICODIN) 5-500 MG per tablet Take 1 tablet by mouth at bedtime.       . hydroxychloroquine (PLAQUENIL) 200 MG tablet Take 1 tablet by mouth daily.      Marland Kitchen levothyroxine (SYNTHROID,  LEVOTHROID) 75 MCG tablet TAKE 1 TABLET BY MOUTH EVERY DAY  90 tablet  3  . Loratadine (CLARITIN PO) Take by mouth as needed.      . methotrexate 2.5 MG tablet Take 2.5 mg by mouth once a week. Takes 6 tablets  weekly      . metoprolol (LOPRESSOR) 50 MG tablet Take 1 tablet (50 mg total) by mouth 2 (two) times daily.  60 tablet  9  . Multiple Vitamin (MULTIVITAMIN) capsule Take 1 capsule by mouth daily. Taking geritol      . omeprazole (PRILOSEC) 20 MG capsule Take 20 mg by mouth daily.           Past Medical History  Diagnosis Date  . Chronic kidney disease     chronic  . Cardiomyopathy     EF 40%.    Marland Kitchen Dyspepsia   . Peripheral vascular disease   . 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 Thyroid disease     hypothyroidism  . Hypertension   . Renovascular hypertension     s/p stenting by Dr Samule Ohm  . Chronic systolic dysfunction of left ventricle   . Arthritis   . CVA (cerebral vascular accident) 06/2005  . Mitral regurgitation   . Hiatal hernia   .  Atrial flutter March 2011    s/p CTI ablation by Dr Ladona Ridgel  . Anemia   . Torn rotator cuff     right     Past Surgical History  Procedure Date  . Cardiac catheterization   . Cholecystectomy     laporascopic  . Abdominal hysterectomy   . Oophorectomy   . Tonsillectomy   . Ras stents 2005    bilateral  . Sma stenosis     NOv '12 stented  . Eye surgery      Family History  Problem Relation Age of Onset  . Heart disease Mother   . Hypertension Father   . Diabetes Brother      History   Social History  . Marital Status: Widowed    Spouse Name: N/A    Number of Children: N/A  . Years of Education: N/A   Occupational History  . Not on file.   Social History Main Topics  . Smoking status: Never Smoker   . Smokeless tobacco: Never Used  . Alcohol Use: No  . Drug Use: No  . Sexually Active: No   Other Topics Concern  . Not on file    Social History Narrative   lives alone. I-ADL's. married 60 yrs, widowed May 28,'07. 2 miscarriages, 2 sons: '50, '53. 1 daughter: '48. 7 grandchildren. Patient is able to perform all activities of daily living which include dressing, feeding, hygiene, toileting, shopping, and managing her finances without assistance.     PHYSICAL EXAM   BP 124/68  Pulse 76  Ht 5\' 2"  (1.575 m)  Wt 129 lb 8 oz (58.741 kg)  BMI 23.69 kg/m2  Constitutional: She is oriented to person, place, and time. She appears well-developed and well-nourished. No distress.  HENT: No nasal discharge.  Head: Normocephalic and atraumatic.  Eyes: Pupils are equal and round. Right eye exhibits no discharge. Left eye exhibits no discharge.  Neck: Normal range of motion. Neck supple. No JVD present. No thyromegaly present.  Cardiovascular: Normal rate, regular rhythm with premature beats, normal heart sounds. Exam reveals no gallop and no friction rub. No murmur heard.  Pulmonary/Chest: Effort normal and breath sounds normal. No stridor. No respiratory distress. She has no wheezes. She has no rales. She exhibits no tenderness.  Abdominal: Soft. Bowel sounds are normal. She exhibits no distension. There is no tenderness. There is no rebound and no guarding.  Musculoskeletal: Normal range of motion. She exhibits no edema and no tenderness.  Neurological: She is alert and oriented to person, place, and time. Coordination normal.  Skin: Skin is warm and dry. No rash noted but with extensive bruising. She is not diaphoretic. No erythema. No pallor.  Psychiatric: She has a normal mood and affect. Her behavior is normal. Judgment and thought content normal.     EKG: Sinus  Rhythm  - frequent PAC s  # PACs = 3. WITHIN NORMAL LIMITS   ASSESSMENT AND PLAN

## 2012-01-01 ENCOUNTER — Other Ambulatory Visit: Payer: Medicare Other

## 2012-01-01 ENCOUNTER — Ambulatory Visit: Payer: Medicare Other | Admitting: Surgery

## 2012-01-25 ENCOUNTER — Ambulatory Visit: Payer: Medicare Other | Admitting: Neurosurgery

## 2012-01-25 ENCOUNTER — Other Ambulatory Visit: Payer: Medicare Other

## 2012-02-08 ENCOUNTER — Ambulatory Visit: Payer: Medicare Other | Admitting: Neurosurgery

## 2012-02-08 ENCOUNTER — Other Ambulatory Visit: Payer: Medicare Other

## 2012-02-29 ENCOUNTER — Other Ambulatory Visit: Payer: Medicare Other

## 2012-02-29 ENCOUNTER — Ambulatory Visit: Payer: Medicare Other | Admitting: Neurosurgery

## 2012-03-14 ENCOUNTER — Other Ambulatory Visit: Payer: Medicare Other

## 2012-03-14 ENCOUNTER — Ambulatory Visit: Payer: Medicare Other | Admitting: Neurosurgery

## 2012-04-01 ENCOUNTER — Other Ambulatory Visit: Payer: Medicare Other

## 2012-04-01 ENCOUNTER — Ambulatory Visit: Payer: Medicare Other | Admitting: Neurosurgery

## 2012-04-15 ENCOUNTER — Other Ambulatory Visit (INDEPENDENT_AMBULATORY_CARE_PROVIDER_SITE_OTHER): Payer: Medicare Other | Admitting: *Deleted

## 2012-04-15 ENCOUNTER — Ambulatory Visit: Payer: Medicare Other | Admitting: Neurosurgery

## 2012-04-15 DIAGNOSIS — K551 Chronic vascular disorders of intestine: Secondary | ICD-10-CM

## 2012-04-15 DIAGNOSIS — Z48812 Encounter for surgical aftercare following surgery on the circulatory system: Secondary | ICD-10-CM

## 2012-04-19 ENCOUNTER — Other Ambulatory Visit: Payer: Self-pay

## 2012-04-19 DIAGNOSIS — K551 Chronic vascular disorders of intestine: Secondary | ICD-10-CM

## 2012-04-24 ENCOUNTER — Encounter: Payer: Self-pay | Admitting: Vascular Surgery

## 2012-05-01 ENCOUNTER — Other Ambulatory Visit: Payer: Self-pay

## 2012-05-01 MED ORDER — METOPROLOL TARTRATE 50 MG PO TABS: 50.0000 mg | ORAL_TABLET | Freq: Two times a day (BID) | ORAL | Status: DC

## 2012-05-20 ENCOUNTER — Telehealth: Payer: Self-pay

## 2012-05-20 NOTE — Telephone Encounter (Signed)
FYI

## 2012-05-20 NOTE — Telephone Encounter (Signed)
Pt concerned b/c she is taking hydrocodone qhs prescribed by PCP for arthritis and is also taking tylenol PRN. She wanted to make sure these were ok to take with cardiac meds I reassured her ok to take  She also voices concerns re:SBP=120. Says she feels good when SBP=130-140, but has been dropping to 110-120 lately She is taking amlodipine 10 mg daily i advised her to try decreasing this by 1/2 to see if this helps BP She will try cutting amlodipine to 5 mg daily and will notify us if this does not help

## 2012-05-20 NOTE — Telephone Encounter (Signed)
Pt scheduled appt for 5/30 for DR Kirke Corin, also states her BP is "running low" 4/5/ -134/47,72 HR, 122/60, HR 83, 133/68 "lowest" 122,115, states this is low for her.  very fatigue. Please call.

## 2012-05-20 NOTE — Telephone Encounter (Signed)
Ok

## 2012-05-29 NOTE — Telephone Encounter (Signed)
Pt concerned b/c HR has stayed elevated over the last few days (90-100 BPM) but BP remains good (SBP=123) She continues to take amlodipine 1/2 tablet daily, metoprolol 50 mg BID, hydralazine 50 mg BID, lasix 40 mg BID, etc  She says she took all am meds as usual HR=100 BPM at 1:30 pm today. She went ahead and took her second dose of metoprolol at that time and HR is now 87 BPM She asks if she should take her usual 50 mg dose tonight of metoprolol I advised to only take 1/2 tablet metoprolol tonight and hold PM dose of hydralazine so as to help prevent BP from dropping too low She is unsure if she is drinking "enough". I advised her to push some fluids this evening to see if this helps HR but I reassured her this is not dangerously high HR She denies vomiting or diarrhea In mean time, I told her I would discuss with Dr. Kirke Corin and call her back Understanding verb

## 2012-05-29 NOTE — Telephone Encounter (Signed)
Decrease Hydralazine to 25 mg bid and increase Metoprolol to 75 mg bid.

## 2012-05-29 NOTE — Telephone Encounter (Signed)
Pt states that her HR is high and her BP is low. Pt took her meds

## 2012-05-30 ENCOUNTER — Other Ambulatory Visit: Payer: Self-pay

## 2012-05-30 MED ORDER — METOPROLOL TARTRATE 50 MG PO TABS: 75.0000 mg | ORAL_TABLET | Freq: Two times a day (BID) | ORAL | Status: DC

## 2012-05-30 MED ORDER — HYDRALAZINE HCL 50 MG PO TABS: 25.0000 mg | ORAL_TABLET | Freq: Two times a day (BID) | ORAL | Status: DC

## 2012-05-30 NOTE — Telephone Encounter (Signed)
Pt informed Understanding verb 

## 2012-06-14 ENCOUNTER — Ambulatory Visit (INDEPENDENT_AMBULATORY_CARE_PROVIDER_SITE_OTHER): Payer: Medicare Other | Admitting: Cardiovascular Disease

## 2012-06-14 ENCOUNTER — Encounter: Payer: Self-pay | Admitting: Cardiovascular Disease

## 2012-06-14 VITALS — BP 199/74 | HR 87 | Ht 61.0 in | Wt 121.8 lb

## 2012-06-14 DIAGNOSIS — I5022 Chronic systolic (congestive) heart failure: Secondary | ICD-10-CM

## 2012-06-14 DIAGNOSIS — I4892 Unspecified atrial flutter: Secondary | ICD-10-CM

## 2012-06-14 DIAGNOSIS — I509 Heart failure, unspecified: Secondary | ICD-10-CM

## 2012-06-14 DIAGNOSIS — I10 Essential (primary) hypertension: Secondary | ICD-10-CM

## 2012-06-14 DIAGNOSIS — I2510 Atherosclerotic heart disease of native coronary artery without angina pectoris: Secondary | ICD-10-CM

## 2012-06-14 MED ORDER — METOPROLOL TARTRATE 100 MG PO TABS: 100.0000 mg | ORAL_TABLET | Freq: Two times a day (BID) | ORAL | Status: AC

## 2012-06-14 NOTE — Progress Notes (Signed)
HPI  This is an 79 year old female who is here today for a followup visit. She has history of atrial flutter status post ablation in 2011. She also has congestive heart failure with mildly reduced LV systolic function. Overall, she has been doing reasonably well. Chronic exertional dyspnea seems to be stable. She has chronic lower extremity edema as well. She was taken off anticoagulation after she maintained sinus rhythm. She has recurrent problems with mesenteric ischemia status post stenting. She is on dual antiplatelet therapy. She also has chronic kidney disease with a creatinine ranging from 1.5-2 which is being followed by nephrology. She called the office few weeks ago due to palpitations and occasional drop in blood pressure. Thus, we increased the dose of metoprolol to 75 mg twice daily and that the dose of hydralazine by half. She reports feeling better. She continues to have occasional skipping in her heart.   Allergies  Allergen Reactions  . Amoxicillin-Pot Clavulanate Other (See Comments)     unknown  . Amoxicillin-Pot Clavulanate Diarrhea  . Clonidine Derivatives Other (See Comments)    Blood pressure dropped too low  . Moxifloxacin Other (See Comments)    REACTION: patient developed tremors and vibrato  . Sulfa Antibiotics Nausea Only  . Sulfamethoxazole Other (See Comments)    REACTION: unspecified  . Sulfonamide Derivatives Nausea Only     Current Outpatient Prescriptions on File Prior to Visit  Medication Sig Dispense Refill  . aspirin 81 MG tablet Take 81 mg by mouth daily.        . Cholecalciferol (VITAMIN D3) 3000 UNITS TABS Take 100 mg by mouth daily.      . clopidogrel (PLAVIX) 75 MG tablet Take 1 tablet (75 mg total) by mouth daily.  30 tablet  6  . furosemide (LASIX) 80 MG tablet Take 80 mg by mouth daily. Take 80mg  in AM and 40mg  in afternoon      . hydrALAZINE (APRESOLINE) 50 MG tablet Take 0.5 tablets (25 mg total) by mouth 2 (two) times daily.  90 tablet  4   . HYDROcodone-acetaminophen (VICODIN) 5-500 MG per tablet Take 1 tablet by mouth at bedtime.       Marland Kitchen levothyroxine (SYNTHROID, LEVOTHROID) 75 MCG tablet TAKE 1 TABLET BY MOUTH EVERY DAY  90 tablet  3  . methotrexate 2.5 MG tablet Take 2.5 mg by mouth once a week. Takes 6 tablets  weekly      . metoprolol (LOPRESSOR) 50 MG tablet Take 1.5 tablets (75 mg total) by mouth 2 (two) times daily.  60 tablet  5  . Multiple Vitamin (MULTIVITAMIN) capsule Take 1 capsule by mouth daily. Taking geritol      . omeprazole (PRILOSEC) 20 MG capsule Take 20 mg by mouth daily.         No current facility-administered medications on file prior to visit.     Past Medical History  Diagnosis Date  . Chronic kidney disease     chronic  . Cardiomyopathy     EF 40%.    Marland Kitchen Dyspepsia   . Peripheral vascular disease   . 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 Thyroid disease     hypothyroidism  . Hypertension   . Renovascular hypertension     s/p stenting by Dr Samule Ohm  . Chronic systolic dysfunction of left ventricle   . Arthritis   . CVA (cerebral vascular accident) 06/2005  . Mitral regurgitation   .  Hiatal hernia   . Atrial flutter March 2011    s/p CTI ablation by Dr Ladona Ridgel  . Anemia   . Torn rotator cuff     right     Past Surgical History  Procedure Laterality Date  . Cardiac catheterization    . Cholecystectomy      laporascopic  . Abdominal hysterectomy    . Oophorectomy    . Tonsillectomy    . Ras stents  2005    bilateral  . Sma stenosis      NOv '12 stented  . Eye surgery       Family History  Problem Relation Age of Onset  . Heart disease Mother   . Hypertension Father   . Diabetes Brother      History   Social History  . Marital Status: Widowed    Spouse Name: N/A    Number of Children: N/A  . Years of Education: N/A   Occupational History  . Not on file.   Social History Main Topics  . Smoking  status: Never Smoker   . Smokeless tobacco: Never Used  . Alcohol Use: No  . Drug Use: No  . Sexually Active: No   Other Topics Concern  . Not on file   Social History Narrative   lives alone. I-ADL's. married 60 yrs, widowed May 28,'07. 2 miscarriages, 2 sons: '50, '53. 1 daughter: '48. 7 grandchildren. Patient is able to perform all activities of daily living which include dressing, feeding, hygiene, toileting, shopping, and managing her finances without assistance.     PHYSICAL EXAM   BP 199/74  Pulse 87  Ht 5\' 1"  (1.549 m)  Wt 121 lb 12 oz (55.225 kg)  BMI 23.02 kg/m2  Constitutional: She is oriented to person, place, and time. She appears well-developed and well-nourished. No distress.  HENT: No nasal discharge.  Head: Normocephalic and atraumatic.  Eyes: Pupils are equal and round. Right eye exhibits no discharge. Left eye exhibits no discharge.  Neck: Normal range of motion. Neck supple. No JVD present. No thyromegaly present.  Cardiovascular: Normal rate, regular rhythm with premature beats, normal heart sounds. Exam reveals no gallop and no friction rub. No murmur heard.  Pulmonary/Chest: Effort normal and breath sounds normal. No stridor. No respiratory distress. She has no wheezes. She has no rales. She exhibits no tenderness.  Abdominal: Soft. Bowel sounds are normal. She exhibits no distension. There is no tenderness. There is no rebound and no guarding.  Musculoskeletal: Normal range of motion. She exhibits no edema and no tenderness.  Neurological: She is alert and oriented to person, place, and time. Coordination normal.  Skin: Skin is warm and dry. No rash noted but with extensive bruising. She is not diaphoretic. No erythema. No pallor.  Psychiatric: She has a normal mood and affect. Her behavior is normal. Judgment and thought content normal.     EKG: Sinus  Rhythm  - frequent multiform ectopic ventricular beats  # VECs = 3, # types 3 BORDERLINE  RHYTHM   ASSESSMENT AND PLAN

## 2012-06-14 NOTE — Patient Instructions (Addendum)
Increase Metoprolol to 100 mg twice daily  Labs today.  Follow up in 6 months.

## 2012-06-15 LAB — BASIC METABOLIC PANEL
BUN/Creatinine Ratio: 48 — ABNORMAL HIGH (ref 11–26)
BUN: 63 mg/dL — ABNORMAL HIGH (ref 10–36)
CO2: 22 mmol/L (ref 19–28)
Chloride: 103 mmol/L (ref 97–108)
GFR calc Af Amer: 41 mL/min/{1.73_m2} — ABNORMAL LOW (ref >59)
Sodium: 141 mmol/L (ref 134–144)

## 2012-06-15 LAB — CBC WITH DIFFERENTIAL/PLATELET
Basophils Absolute: 0 10*3/uL (ref 0.0–0.2)
Eosinophils Absolute: 0.1 10*3/uL (ref 0.0–0.4)
HCT: 36.2 % (ref 34.0–46.6)
Hemoglobin: 11.9 g/dL (ref 11.1–15.9)
Immature Granulocytes: 0 % (ref 0–2)
Lymphocytes Absolute: 1.3 10*3/uL (ref 0.7–3.1)
MCH: 31.9 pg (ref 26.6–33.0)
MCHC: 32.9 g/dL (ref 31.5–35.7)
MCV: 97 fL (ref 79–97)
Monocytes Absolute: 0.2 10*3/uL (ref 0.1–0.9)
Monocytes: 3 % — ABNORMAL LOW (ref 4–12)
Neutrophils Absolute: 5.5 10*3/uL (ref 1.4–7.0)
RDW: 16.5 % — ABNORMAL HIGH (ref 12.3–15.4)

## 2012-06-17 ENCOUNTER — Encounter: Payer: Self-pay | Admitting: *Deleted

## 2012-06-17 ENCOUNTER — Encounter: Payer: Self-pay | Admitting: Cardiovascular Disease

## 2012-06-17 NOTE — Assessment & Plan Note (Addendum)
No evidence of recurrent atrial flutter after ablation. Her current symptoms seems to be related to frequent PVCs. I will check labs today. I will also increase the dose of metoprolol to 100 mg twice daily especially that her resting heart rate is still on the high side.

## 2012-06-17 NOTE — Progress Notes (Signed)
Results released to mychart

## 2012-06-17 NOTE — Assessment & Plan Note (Signed)
With mildly reduced LV systolic function. Her heart failure symptoms seem to be stable.

## 2012-06-17 NOTE — Progress Notes (Signed)
Lmtcb.

## 2012-06-17 NOTE — Assessment & Plan Note (Signed)
Blood pressure is running slightly high. The dose of metoprolol will be increased. She tends to have labile hypertension.

## 2012-06-18 ENCOUNTER — Telehealth: Payer: Self-pay

## 2012-06-18 NOTE — Telephone Encounter (Signed)
Pt would like lab results.  

## 2012-06-18 NOTE — Telephone Encounter (Signed)
Pt informed Understanding verb She asks that I mail her a copy of these labs

## 2012-06-20 ENCOUNTER — Ambulatory Visit: Payer: Medicare Other | Admitting: Cardiovascular Disease

## 2012-07-10 ENCOUNTER — Other Ambulatory Visit: Payer: Self-pay | Admitting: Internal Medicine

## 2012-07-17 ENCOUNTER — Other Ambulatory Visit: Payer: Self-pay

## 2012-07-17 MED ORDER — CLOPIDOGREL BISULFATE 75 MG PO TABS: 75.0000 mg | ORAL_TABLET | Freq: Every day | ORAL | Status: AC

## 2012-08-21 ENCOUNTER — Other Ambulatory Visit: Payer: Self-pay

## 2012-09-25 ENCOUNTER — Encounter: Payer: Self-pay | Admitting: Dermatopathology

## 2012-10-18 ENCOUNTER — Encounter: Payer: Self-pay | Admitting: Family

## 2012-10-21 ENCOUNTER — Encounter: Payer: Self-pay | Admitting: Family

## 2012-10-21 ENCOUNTER — Ambulatory Visit (INDEPENDENT_AMBULATORY_CARE_PROVIDER_SITE_OTHER): Payer: Medicare Other | Admitting: Family

## 2012-10-21 ENCOUNTER — Other Ambulatory Visit: Payer: Self-pay | Admitting: Surgery

## 2012-10-21 ENCOUNTER — Ambulatory Visit (HOSPITAL_COMMUNITY)
Admission: RE | Admit: 2012-10-21 | Discharge: 2012-10-21 | Disposition: A | Discharge status: Home / Self Care | Payer: Medicare Other | Source: Ambulatory Visit | Attending: Family | Admitting: Family

## 2012-10-21 VITALS — BP 174/71 | HR 62 | Resp 16 | Ht 62.0 in | Wt 115.0 lb

## 2012-10-21 DIAGNOSIS — K551 Chronic vascular disorders of intestine: Secondary | ICD-10-CM

## 2012-10-21 DIAGNOSIS — I6529 Occlusion and stenosis of unspecified carotid artery: Secondary | ICD-10-CM

## 2012-10-21 DIAGNOSIS — I701 Atherosclerosis of renal artery: Secondary | ICD-10-CM | POA: Insufficient documentation

## 2012-10-21 DIAGNOSIS — K55059 Acute (reversible) ischemia of intestine, part and extent unspecified: Secondary | ICD-10-CM

## 2012-10-21 DIAGNOSIS — Z48812 Encounter for surgical aftercare following surgery on the circulatory system: Secondary | ICD-10-CM

## 2012-10-21 NOTE — Progress Notes (Signed)
[facility]  Established Mesenteric Ischemia  History of Present Illness  Elizabeth Wall is a 79 y.o. (1919/09/21) female patient of Dr. Myra Gianotti who underwent superior mesenteric artery stenting in January of 2012 with improvement in her ability to tolerate food. On May 10, 2010 she was found to have in-stent stenosis and underwent restenting of this area. Again she was found to have recurrent stenosis and most recently on November 30, 2010 she underwent stenting using a covered stent (atrium). She had to be admitted preprocedure for hydration as she had an elevated creatinine. This was felt to be due to her inability to tolerate by mouth. The patient also reports that she had stents placed in both renal arteries "long ago" to help improved her blood pressure but she states her blood pressure was not affected after the renal arteries stenting.  She states nephrologist, Dr. Camille Bal, is monitoring her creatinine. Patient reports that [facility] was checking on her renal stents until 2 years ago; her cardiologist is Dr. Romilda Joy. Patient states her cardiologist is aware that her heart rate is irregular. Since the mesenteric stents were placed she no longer has trapped gas after eating, but she does have gas and diarrhea after eating but no longer trapped. The patient denies food fear, denies abdominal pain. Had "ministroke" in 2007 as manifested by left hand weakness of which she states some weakness is still present. States she had a recent echocardiogram requested by Dr. Kirke Corin and pt. States it was OK.  On ROS today: The patient denies food fear, denies abdominal pain, but does have gas that bothers her.  She does report weight loss recently due to the fact that she lives alone and does not cook much for her self, she eats more when she eats out. Never smoked. Her creatinine was 1.26 two months ago.  Past Medical History  Diagnosis Date  .  Chronic kidney disease     chronic  . Cardiomyopathy     EF 40%.    Marland Kitchen Dyspepsia   . Peripheral vascular disease   . 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 Thyroid disease     hypothyroidism  . Hypertension   . Renovascular hypertension     s/p stenting by Dr Samule Ohm  . Chronic systolic dysfunction of left ventricle   . Arthritis   . CVA (cerebral vascular accident) 06/2005  . Mitral regurgitation   . Hiatal hernia   . Atrial flutter March 2011    s/p CTI ablation by Dr Ladona Ridgel  . Anemia   . Torn rotator cuff     right   Past Surgical History  Procedure Laterality Date  . Cardiac catheterization    . Cholecystectomy      laporascopic  . Abdominal hysterectomy    . Oophorectomy    . Tonsillectomy    . Ras stents  2005    bilateral  . Sma stenosis      NOv '12 stented  . Eye surgery     History   Social History  . Marital Status: Widowed    Spouse Name: N/A    Number of Children: N/A  . Years of Education: N/A   Occupational History  . Not on file.   Social History Main Topics  . Smoking status: Never Smoker   . Smokeless tobacco: Never Used  . Alcohol Use: No  . Drug  Use: No  . Sexual Activity: No   Other Topics Concern  . Not on file   Social History Narrative   lives alone. I-ADL's. married 60 yrs, widowed May 28,'07. 2 miscarriages, 2 sons: '50, '53. 1 daughter: '48. 7 grandchildren. Patient is able to perform all activities of daily living which include dressing, feeding, hygiene, toileting, shopping, and managing her finances without assistance.   Family History  Problem Relation Age of Onset  . Heart disease Mother   . Hypertension Father   . Diabetes Brother    Current Outpatient Prescriptions on File Prior to Visit  Medication Sig Dispense Refill  . acetaminophen (TYLENOL) 325 MG tablet Take 650 mg by mouth every 4 (four) hours as needed for pain.      Marland Kitchen amLODipine (NORVASC) 10  MG tablet Take 5 mg by mouth daily.      Marland Kitchen aspirin 81 MG tablet Take 81 mg by mouth daily.        . Cholecalciferol (VITAMIN D3) 3000 UNITS TABS Take 100 mg by mouth daily.      . clopidogrel (PLAVIX) 75 MG tablet Take 1 tablet (75 mg total) by mouth daily.  30 tablet  6  . Febuxostat (ULORIC) 80 MG TABS Take 80 mg by mouth daily.      . furosemide (LASIX) 80 MG tablet Take 80 mg by mouth daily. Take 80mg  in AM and 40mg  in afternoon      . hydrALAZINE (APRESOLINE) 50 MG tablet Take 0.5 tablets (25 mg total) by mouth 2 (two) times daily.  90 tablet  4  . HYDROcodone-acetaminophen (VICODIN) 5-500 MG per tablet Take 1 tablet by mouth at bedtime.       Marland Kitchen levothyroxine (SYNTHROID, LEVOTHROID) 75 MCG tablet TAKE 1 TABLET BY MOUTH EVERY DAY  90 tablet  3  . methotrexate 2.5 MG tablet Take 2.5 mg by mouth once a week. Takes 6 tablets  weekly      . metoprolol (LOPRESSOR) 100 MG tablet Take 1 tablet (100 mg total) by mouth 2 (two) times daily.  60 tablet  6  . Multiple Vitamin (MULTIVITAMIN) capsule Take 1 capsule by mouth daily. Taking geritol      . omeprazole (PRILOSEC) 20 MG capsule Take 20 mg by mouth daily.        Marland Kitchen guaiFENesin (MUCINEX) 600 MG 12 hr tablet Take 1,200 mg by mouth 2 (two) times daily.      . hydrALAZINE (APRESOLINE) 50 MG tablet TAKE 1 TABLET BY MOUTH TWICE A DAY  90 tablet  4   No current facility-administered medications on file prior to visit.    Allergies  Allergen Reactions  . Amoxicillin-Pot Clavulanate Other (See Comments)     unknown  . Amoxicillin-Pot Clavulanate Diarrhea  . Clonidine Derivatives Other (See Comments)    Blood pressure dropped too low  . Moxifloxacin Other (See Comments)    REACTION: patient developed tremors and vibrato  . Sulfa Antibiotics Nausea Only  . Sulfamethoxazole Other (See Comments)    REACTION: unspecified  . Sulfonamide Derivatives Nausea Only      Physical Examination  Filed Vitals:   10/21/12 1107  BP: 174/71  Pulse: 62   Resp: 16   Filed Weights   10/21/12 1107  Weight: 115 lb (52.164 kg)   Body mass index is 21.03 kg/(m^2).  General: A&O x 3, WDWN.  Pulmonary: Sym exp, good air movt, CTAB, no rales, rhonchi, or wheezing.  Cardiac: Irregular rhythm and rate  Vascular:  Vessel Right Left  Radial Palpable Palpable  Carotid  without bruit  with bruit  Aorta  palpable N/A  Popliteal Not palpable Not palpable  PT Not Palpable Not Palpable  DP Palpable Palpable   Gastrointestinal: soft, NTND, -G/R, - HSM, - masses, - CVAT B.  Musculoskeletal: M/S 3/5 throughout, Extremities without ischemic changes.  Neurologic: Pain and light touch intact in extremities, Motor exam as listed above.  Non-Invasive Vascular Imaging  Mesenteric Duplex (Date: 10/21/2012):  Renal artery stents not visualized. >60% right and left renal artery stenosis  Ao:  Known atherosclerosis  Celiac artery: 70-99% stenosis  SMA: 60-99% stent stenosis.  IMA: stenosis  Medical Decision Making  Elizabeth Wall is a 79 y.o. female who presents with: symptomatic chronic mesenteric ischemia status post SMA stent placement and re-stenting x2. Symptoms are gas and bloating after eating, but not as severe as before the SMA stenting. Since she reports a TIA in 2007, and left carotid bruit auscultated, will add bilateral carotid artery Duplex when she returns in 6 months for mesenteric Duplex.   Based on her exam and studies, and after discussing with Dr. Myra Gianotti, advised the patient to return in 6 months for mesenteric Duplex and bilateral carotid Duplex and to follow up with Dr. Myra Gianotti after her non-invasive vascular lab studies that day.  I discussed in depth with the patient the nature of atherosclerosis, and emphasized the importance of maximal medical management including strict control of blood pressure, blood glucose, and lipid levels, obtaining regular exercise, and continued cessation of smoking.    The patient is aware  that without maximal medical management the underlying atherosclerotic disease process will progress, limiting the benefit of any interventions. The patient is currently on an anti-platelet: ASA and Plavix.  Thank you for allowing [place] to participate in this patient's care.  Charisse March, RN, MSN, FNP-C [facility] Office: [phone] Pager: [phone]  10/21/2012, 9:00 AM  Clinic MD: Myra Gianotti

## 2012-10-21 NOTE — Patient Instructions (Addendum)
Chronic Mesenteric Ischemia Mesentery refers to the tissues that connect the blood vessels to the intestines. Ischemia refers to a restriction in blood supply. Mesenteric ischemia happens when an artery or vein that supports the intestine becomes blocked or narrow. Chronic mesenteric ischemia (CMI) develops gradually and is a long-term condition. This is sometimes referred to as "intestinal angina." CAUSES  CMI occurs when fatty deposits are building up in an artery or vein, but have not yet restricted blood flow entirely. It can also be caused by a difference in some people's anatomy or by a large, rapid weight loss. When blood supply to the intestine is severely restricted, needed oxygen cannot reach the intestines for proper function. Patients over age 89 with a history of coronary or vascular disease and people who smoke have the greatest risk for mesenteric ischemia. SYMPTOMS  CMI usually feels like a severe stomachache. Some patients with CMI become fearful of eating due to pain. CMI typically causes:  Abdominal pain or cramps about 30 minutes after a meal.  Abdominal pain after eating that becomes worse over time.  Diarrhea.  Nausea.  Vomiting.  Bloating. DIAGNOSIS  CMI is often diagnosed through the patient's history, physical exam, and X-ray tests such as CT scans or angiography. Angiography is an imaging test that uses a dye to obtain a picture of blood flow to the intestine. TREATMENT  Treatment of CMI may include:  Medicines to reduce blood clotting and increase blood flow.  Angioplasty. This is surgery to widen the affected artery, reduce blockage, and sometimes insert a small, mesh tube (stent).  Surgery to remove the blockage, repair arteries or veins, and restore blood flow. In addition:  A bypass surgery may be performed to bypass the blockage and reconnect healthy arteries or veins.  A stent may be inserted in the affected area to help keep blocked arteries  open. PREVENTION  Certain lifestyle factors can help to prevent CMI. These include:  Regular exercise.  Healthy weight.  Healthy diet.  Managing cholesterol levels.  Keeping blood pressure and heart rhythm problems under control.  Not smoking. HOME CARE INSTRUCTIONS  Only take over-the-counter or prescription medicines as directed by your caregiver.  Keep all follow-up appointments as directed by your caregiver. SEEK IMMEDIATE MEDICAL CARE IF:  You have severe abdominal pain.  You notice blood in your stool.  You develop nausea, vomiting, or diarrhea.  You have a fever. MAKE SURE YOU:  Understand these instructions.  Will watch your condition.  Will get help right away if you are not doing well or get worse. Document Released: 08/22/2010 Document Revised: 03/27/2011 Document Reviewed: 08/22/2010 [facility] Patient Information 2014 [place], [place].   Flatulence Burping releases air that you swallow. The bubbles from some drinks may cause burps. There are good germs in your gut to help you digest food. Gas is produced by these germs and released from your bottom. Most people release 3 to 4 quarts of gas every day. This is normal. HOME CARE Eat or drink less of the foods or liquids that give you gas. Take the time to chew your food well. Talk less while you eat. Do not suck on ice or hard candy. Sip slowly. Stir some of the bubbles out of fizzy drinks with a spoon or straw. Avoid chewing gum or smoking. Ask your doctor about liquids and tablets that may help control burping and gas. Only take medicine as told by your doctor. GET HELP RIGHT AWAY IF:  There is discomfort when you  burp or pass gas. You throw up (vomit) when you burp. Poop (stool) comes out when you pass gas. Your belly is puffy (swollen) and hard. MAKE SURE YOU:  Understand these instructions. Will watch your condition. Will get help right away if you are not doing well or get worse. Document  Released: 11/05/2007 Document Revised: 03/27/2011 Document Reviewed: 11/05/2007 [facility] Patient Information 2014 [place], [place].    Stroke Prevention Some medical conditions and behaviors are associated with an increased chance of having a stroke. You may prevent a stroke by making healthy choices and managing medical conditions. Reduce your risk of having a stroke by:  Staying physically active. Get at least 30 minutes of activity on most or all days.  Not smoking. It may also be helpful to avoid exposure to secondhand smoke.  Limiting alcohol use. Moderate alcohol use is considered to be:  No more than 2 drinks per day for men.  No more than 1 drink per day for nonpregnant women.  Eating healthy foods.  Include 5 or more servings of fruits and vegetables a day.  Certain diets may be prescribed to address high blood pressure, high cholesterol, diabetes, or obesity.  Managing your cholesterol levels.  A low-saturated fat, low-trans fat, low-cholesterol, and high-fiber diet may control cholesterol levels.  Take any prescribed medicines to control cholesterol as directed by your caregiver.  Managing your diabetes.  A controlled-carbohydrate, controlled-sugar diet is recommended to manage diabetes.  Take any prescribed medicines to control diabetes as directed by your caregiver.  Controlling your high blood pressure (hypertension).  A low-salt (sodium), low-saturated fat, low-trans fat, and low-cholesterol diet is recommended to manage high blood pressure.  Take any prescribed medicines to control hypertension as directed by your caregiver.  Maintaining a healthy weight.  A reduced-calorie, low-sodium, low-saturated fat, low-trans fat, low-cholesterol diet is recommended to manage weight.  Stopping drug abuse.  Avoiding birth control pills.  Talk to your caregiver about the risks of taking birth control pills if you are over 40 years old, smoke, get migraines, or have  ever had a blood clot.  Getting evaluated for sleep disorders (sleep apnea).  Talk to your caregiver about getting a sleep evaluation if you snore a lot or have excessive sleepiness.  Taking medicines as directed by your caregiver.  For some people, aspirin or blood thinners (anticoagulants) are helpful in reducing the risk of forming abnormal blood clots that can lead to stroke. If you have the irregular heart rhythm of atrial fibrillation, you should be on a blood thinner unless there is a good reason you cannot take them.  Understand all your medicine instructions. SEEK IMMEDIATE MEDICAL CARE IF:   You have sudden weakness or numbness of the face, arm, or leg, especially on one side of the body.  You have sudden confusion.  You have trouble speaking (aphasia) or understanding.  You have sudden trouble seeing in one or both eyes.  You have sudden trouble walking.  You have dizziness.  You have a loss of balance or coordination.  You have a sudden, severe headache with no known cause.  You have new chest pain or an irregular heartbeat. Any of these symptoms may represent a serious problem that is an emergency. Do not wait to see if the symptoms will go away. Get medical help right away. Call your local emergency services (911 in [place] Do not drive yourself to the hospital. Document Released: 02/10/2004 Document Revised: 03/27/2011 Document Reviewed: 08/22/2010 [facility] Patient Information 2014 [place].  

## 2012-10-22 ENCOUNTER — Other Ambulatory Visit: Payer: Self-pay

## 2012-10-22 MED ORDER — AMLODIPINE BESYLATE 10 MG PO TABS: 5.0000 mg | ORAL_TABLET | Freq: Every day | ORAL | Status: DC

## 2012-10-22 NOTE — Addendum Note (Signed)
Addended by: Sharee Pimple on: 10/22/2012 09:32 AM   Modules accepted: Orders

## 2012-11-28 ENCOUNTER — Telehealth: Payer: Self-pay

## 2012-11-28 NOTE — Telephone Encounter (Signed)
Phone call from [place], physical therapist with [facility] [phone] needing to verify patient's diagnosis of CHF so she can address this in therapy. I let Dot Lanes know this is correct.

## 2012-12-12 ENCOUNTER — Other Ambulatory Visit: Payer: Self-pay | Admitting: Internal Medicine

## 2012-12-16 ENCOUNTER — Ambulatory Visit (INDEPENDENT_AMBULATORY_CARE_PROVIDER_SITE_OTHER): Payer: Medicare Other | Admitting: Cardiovascular Disease

## 2012-12-16 ENCOUNTER — Encounter: Payer: Self-pay | Admitting: Cardiovascular Disease

## 2012-12-16 VITALS — BP 162/78 | HR 84 | Ht 61.0 in | Wt 115.8 lb

## 2012-12-16 DIAGNOSIS — I2510 Atherosclerotic heart disease of native coronary artery without angina pectoris: Secondary | ICD-10-CM

## 2012-12-16 DIAGNOSIS — I509 Heart failure, unspecified: Secondary | ICD-10-CM

## 2012-12-16 DIAGNOSIS — I4892 Unspecified atrial flutter: Secondary | ICD-10-CM

## 2012-12-16 DIAGNOSIS — I10 Essential (primary) hypertension: Secondary | ICD-10-CM

## 2012-12-16 MED ORDER — AMLODIPINE BESYLATE 5 MG PO TABS: 5.0000 mg | ORAL_TABLET | Freq: Every day | ORAL | Status: DC

## 2012-12-16 NOTE — Patient Instructions (Signed)
Continue same medications.   Your physician wants you to follow-up in: 6 months.  You will receive a reminder letter in the mail two months in advance. If you don't receive a letter, please call our office to schedule the follow-up appointment.  

## 2012-12-16 NOTE — Assessment & Plan Note (Signed)
With mildly reduced LV systolic function. Her heart failure symptoms seem to be stable. She appears to be euvolemic on current dose of Lasix.

## 2012-12-16 NOTE — Assessment & Plan Note (Signed)
No evidence of recurrent atrial flutter after ablation. Palpitations improved after increasing the dose of metoprolol to 100 mg twice daily. Continue current medications.

## 2012-12-16 NOTE — Progress Notes (Signed)
HPI  This is an 79 year old female who is here today for a followup visit. She has history of atrial flutter status post ablation in 2011. She also has congestive heart failure with mildly reduced LV systolic function. Overall, she has been doing reasonably well. Chronic exertional dyspnea seems to be stable. She has chronic lower extremity edema as well. She was taken off anticoagulation after she maintained sinus rhythm. She has recurrent problems with mesenteric ischemia status post stenting. She is on dual antiplatelet therapy. She also has chronic kidney disease with a creatinine ranging from 1.5-2 which is being followed by nephrology. She had palpitations which improved after increasing the dose of metoprolol to 100 mg once daily. She has been doing well since her last visit with no recurrent chest pain or palpitations. Blood pressure is well controlled at home. She is accompanied today by her caregiver [place]. She has problems with shoulder arthritis bilaterally. She currently does not drive.  Allergies  Allergen Reactions  . Amoxicillin-Pot Clavulanate Other (See Comments)     unknown  . Amoxicillin-Pot Clavulanate Diarrhea  . Clonidine Derivatives Other (See Comments)    Blood pressure dropped too low  . Moxifloxacin Other (See Comments)    REACTION: patient developed tremors and vibrato  . Sulfa Antibiotics Nausea Only  . Sulfamethoxazole Other (See Comments)    REACTION: unspecified  . Sulfonamide Derivatives Nausea Only     Current Outpatient Prescriptions on File Prior to Visit  Medication Sig Dispense Refill  . aspirin 81 MG tablet Take 81 mg by mouth daily.        . Cholecalciferol (VITAMIN D3) 3000 UNITS TABS Take 100 mg by mouth daily.      . clopidogrel (PLAVIX) 75 MG tablet Take 1 tablet (75 mg total) by mouth daily.  30 tablet  6  . Febuxostat (ULORIC) 80 MG TABS Take 80 mg by mouth daily.      . folic acid (FOLVITE) 1 MG tablet Take 1 mg by mouth daily.      .  furosemide (LASIX) 80 MG tablet Take 80 mg by mouth daily. Take 80mg  in AM and 40mg  in afternoon      . guaiFENesin (MUCINEX) 600 MG 12 hr tablet Take 1,200 mg by mouth 2 (two) times daily.      . hydrALAZINE (APRESOLINE) 50 MG tablet TAKE 1 TABLET BY MOUTH TWICE A DAY  90 tablet  4  . levothyroxine (SYNTHROID, LEVOTHROID) 75 MCG tablet TAKE 1 TABLET BY MOUTH EVERY DAY  90 tablet  3  . methotrexate 2.5 MG tablet Take 2.5 mg by mouth once a week. Takes 6 tablets  weekly      . metoprolol (LOPRESSOR) 100 MG tablet Take 1 tablet (100 mg total) by mouth 2 (two) times daily.  60 tablet  6  . Multiple Vitamin (MULTIVITAMIN) capsule Take 1 capsule by mouth daily.       Marland Kitchen omeprazole (PRILOSEC) 20 MG capsule Take 20 mg by mouth daily.         No current facility-administered medications on file prior to visit.     Past Medical History  Diagnosis Date  . Chronic kidney disease     chronic  . Cardiomyopathy     EF 40%.    Marland Kitchen Dyspepsia   . Peripheral vascular disease   . 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 Thyroid disease  hypothyroidism  . Hypertension   . Renovascular hypertension     s/p stenting by Dr Samule Ohm  . Chronic systolic dysfunction of left ventricle   . Arthritis   . CVA (cerebral vascular accident) 06/2005  . Mitral regurgitation   . Hiatal hernia   . Atrial flutter March 2011    s/p CTI ablation by Dr Ladona Ridgel  . Anemia   . Torn rotator cuff     right     Past Surgical History  Procedure Laterality Date  . Cardiac catheterization    . Cholecystectomy      laporascopic  . Abdominal hysterectomy    . Oophorectomy    . Tonsillectomy    . Ras stents  2005    bilateral  . Sma stenosis      NOv '12 stented  . Eye surgery       Family History  Problem Relation Age of Onset  . Heart disease Mother   . Hypertension Father   . Diabetes Brother      History   Social History  . Marital Status:  Widowed    Spouse Name: N/A    Number of Children: N/A  . Years of Education: N/A   Occupational History  . Not on file.   Social History Main Topics  . Smoking status: Never Smoker   . Smokeless tobacco: Never Used  . Alcohol Use: No  . Drug Use: No  . Sexual Activity: No   Other Topics Concern  . Not on file   Social History Narrative   lives alone. I-ADL's. married 60 yrs, widowed May 28,'07. 2 miscarriages, 2 sons: '50, '53. 1 daughter: '48. 7 grandchildren. Patient is able to perform all activities of daily living which include dressing, feeding, hygiene, toileting, shopping, and managing her finances without assistance.     PHYSICAL EXAM   BP 162/78  Pulse 84  Ht 5\' 1"  (1.549 m)  Wt 115 lb 12 oz (52.504 kg)  BMI 21.88 kg/m2  Constitutional: She is oriented to person, place, and time. She appears well-developed and well-nourished. No distress.  HENT: No nasal discharge.  Head: Normocephalic and atraumatic.  Eyes: Pupils are equal and round. Right eye exhibits no discharge. Left eye exhibits no discharge.  Neck: Normal range of motion. Neck supple. No JVD present. No thyromegaly present.  Cardiovascular: Normal rate, regular rhythm with premature beats, normal heart sounds. Exam reveals no gallop and no friction rub. No murmur heard.  Pulmonary/Chest: Effort normal and breath sounds normal. No stridor. No respiratory distress. She has no wheezes. She has no rales. She exhibits no tenderness.  Abdominal: Soft. Bowel sounds are normal. She exhibits no distension. There is no tenderness. There is no rebound and no guarding.  Musculoskeletal: Normal range of motion. She exhibits trace edema and no tenderness.  Neurological: She is alert and oriented to person, place, and time. Coordination normal.  Skin: Skin is warm and dry. No rash noted but with extensive bruising. She is not diaphoretic. No erythema. No pallor.  Psychiatric: She has a normal mood and affect. Her  behavior is normal. Judgment and thought content normal.     EKG: Sinus  Rhythm with sinus arrhythmia and PACs -Nonspecific ST depression   +   Precordial T-wave abnormality.   ABNORMAL    ASSESSMENT AND PLAN

## 2012-12-16 NOTE — Assessment & Plan Note (Signed)
Blood pressure is elevated today. However, home blood pressure readings are much better. Continue current medications.

## 2012-12-18 ENCOUNTER — Encounter: Payer: Self-pay | Admitting: Internal Medicine

## 2012-12-18 ENCOUNTER — Ambulatory Visit (INDEPENDENT_AMBULATORY_CARE_PROVIDER_SITE_OTHER): Payer: Medicare Other | Admitting: Internal Medicine

## 2012-12-18 VITALS — BP 160/80 | HR 78 | Temp 97.6°F | Wt 112.4 lb

## 2012-12-18 DIAGNOSIS — M25519 Pain in unspecified shoulder: Secondary | ICD-10-CM

## 2012-12-18 DIAGNOSIS — L439 Lichen planus, unspecified: Secondary | ICD-10-CM

## 2012-12-18 DIAGNOSIS — Z23 Encounter for immunization: Secondary | ICD-10-CM

## 2012-12-18 DIAGNOSIS — M1990 Unspecified osteoarthritis, unspecified site: Secondary | ICD-10-CM

## 2012-12-18 DIAGNOSIS — I10 Essential (primary) hypertension: Secondary | ICD-10-CM

## 2012-12-18 DIAGNOSIS — G8929 Other chronic pain: Secondary | ICD-10-CM

## 2012-12-18 NOTE — Patient Instructions (Signed)
1. Shoulders - left shoulder with inflammation that does respond to injections. Right shoulder - per Dr. Ewell Poe note from November '13 - x-ray of the right shoulder suggests a rotator cuff injury and a bone spur. Although you are not a candidate for shoulder replacement surgery you may be a candidate for arthroscopy repair of the rotator cuff right shoulder.  Plan Referral to Dr. Butler Denmark at [facility] for assessment.   2. Dermatology - will refer you to Dr. Para Skeans for consultation  3. Runny nose - it is safe for you to take Loratadine (generic claritin) 10 mg once a day.  4. Bowel problems - the urgency for defecation after eating certain foods is a heightened "gastro- colic reflex" for which there are no medications. Best to avoid the foods that trigger the symptoms.   5. Immunizations - by your report you have had Pneumovax for pneumonia protection. Today you are given a companion vaccine called Prevnar. You have had shingles vaccine and a flu shot. You are due for tetanus shot at some point.   6. Blood pressure a little high today but your word is good for better control at home.   Have a good holiday.

## 2012-12-18 NOTE — Progress Notes (Signed)
Subjective:    Patient ID: Elizabeth Wall, female    DOB: Mar 11, 1919, 79 y.o.   MRN: [id number]  HPI Elizabeth Wall saw Dr. Dareen Wall in June '14 - she had several intra-articular injections left.for shoulder. She had been diagnosed with tendonitis. She has been diagnosed with torn rotator cuff right. She has had physical therapy which has really helped the left arm but she is still has to learn to live with these problems. She does have help in the home.   Reviewed correspondence from Dr. Dareen Wall: Note of Nov '13 - x-ray revealed high-riding humeral head suggestive of rotator cuff injury along with an osteophyte.  She would like a referral to a new dermatologist.  Past Medical History  Diagnosis Date  . Chronic kidney disease     chronic  . Cardiomyopathy     EF 40%.    Marland Kitchen Dyspepsia   . Peripheral vascular disease   . 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 Thyroid disease     hypothyroidism  . Hypertension   . Renovascular hypertension     s/p stenting by Dr Samule Ohm  . Chronic systolic dysfunction of left ventricle   . Arthritis   . CVA (cerebral vascular accident) 06/2005  . Mitral regurgitation   . Hiatal hernia   . Atrial flutter March 2011    s/p CTI ablation by Dr Ladona Ridgel  . Anemia   . Torn rotator cuff     right   Past Surgical History  Procedure Laterality Date  . Cardiac catheterization    . Cholecystectomy      laporascopic  . Abdominal hysterectomy    . Oophorectomy    . Tonsillectomy    . Ras stents  2005    bilateral  . Sma stenosis      NOv '12 stented  . Eye surgery     Family History  Problem Relation Age of Onset  . Heart disease Mother   . Hypertension Father   . Diabetes Brother    History   Social History  . Marital Status: Widowed    Spouse Name: N/A    Number of Children: N/A  . Years of Education: N/A   Occupational History  . Not on file.   Social History Main  Topics  . Smoking status: Never Smoker   . Smokeless tobacco: Never Used  . Alcohol Use: No  . Drug Use: No  . Sexual Activity: No   Other Topics Concern  . Not on file   Social History Narrative   lives alone. I-ADL's. married 60 yrs, widowed May 28,'07. 2 miscarriages, 2 sons: '50, '53. 1 daughter: '48. 7 grandchildren. Patient is able to perform all activities of daily living which include dressing, feeding, hygiene, toileting, shopping, and managing her finances without assistance.     Current Outpatient Prescriptions on File Prior to Visit  Medication Sig Dispense Refill  . amLODipine (NORVASC) 5 MG tablet Take 1 tablet (5 mg total) by mouth daily.  30 tablet  11  . aspirin 81 MG tablet Take 81 mg by mouth daily.        . Cholecalciferol (VITAMIN D3) 3000 UNITS TABS Take 100 mg by mouth daily.      . clopidogrel (PLAVIX) 75 MG tablet Take 1 tablet (75 mg total) by mouth daily.  30 tablet  6  . Febuxostat (ULORIC) 80 MG TABS Take 80 mg by  mouth daily.      . folic acid (FOLVITE) 1 MG tablet Take 1 mg by mouth daily.      . furosemide (LASIX) 80 MG tablet Take 80 mg by mouth daily. Take 80mg  in AM and 40mg  in afternoon      . guaiFENesin (MUCINEX) 600 MG 12 hr tablet Take 1,200 mg by mouth 2 (two) times daily.      . hydrALAZINE (APRESOLINE) 50 MG tablet TAKE 1 TABLET BY MOUTH TWICE A DAY  90 tablet  4  . levothyroxine (SYNTHROID, LEVOTHROID) 75 MCG tablet TAKE 1 TABLET BY MOUTH EVERY DAY  90 tablet  3  . methotrexate 2.5 MG tablet Take 2.5 mg by mouth once a week. Takes 6 tablets  weekly      . metoprolol (LOPRESSOR) 100 MG tablet Take 1 tablet (100 mg total) by mouth 2 (two) times daily.  60 tablet  6  . Multiple Vitamin (MULTIVITAMIN) capsule Take 1 capsule by mouth daily.       Marland Kitchen omeprazole (PRILOSEC) 20 MG capsule Take 20 mg by mouth daily.         No current facility-administered medications on file prior to visit.       Review of Systems System review is negative for  any constitutional, cardiac, pulmonary, GI or neuro symptoms or complaints other than as described in the HPI.     Objective:   Physical Exam Filed Vitals:   12/18/12 1423  BP: 160/80  Pulse: 78  Temp: 97.6 F (36.4 C)   Gen'l- very elderly woman in no acute distress HEENT- C&S clear Cor - 2+ radial pulse, RRR Pulm - CTAP, normal respirations Abd- soft, BS+ Neuro - awake and alert MSK - flexion of the right shoulder at 50%, crepitus right shoulder. Good ROM left shoulder. Valgus defomities both hands at MCP joints.  Enlargement of MCP and PIP joints noted. No inflammation noted.        Assessment & Plan:  Health maintenance - by your report you have had Pneumovax for pneumonia protection. Today you are given a companion vaccine called Prevnar. You have had shingles vaccine and a flu shot. You are due for tetanus shot at some point.

## 2012-12-18 NOTE — Progress Notes (Signed)
Pre visit review using our clinic review tool, if applicable. No additional management support is needed unless otherwise documented below in the visit note. 

## 2012-12-21 NOTE — Assessment & Plan Note (Signed)
Shoulders - left shoulder with inflammation that does respond to injections. Right shoulder - per Dr. Ewell Poe note from November '13 - x-ray of the right shoulder suggests a rotator cuff injury and a bone spur. Although you are not a candidate for shoulder replacement surgery you may be a candidate for arthroscopy repair of the rotator cuff right shoulder.  Plan Referral to Dr. Butler Denmark at [facility] orthopedics for assessment.

## 2012-12-21 NOTE — Assessment & Plan Note (Signed)
BP Readings from Last 3 Encounters:  12/18/12 160/80  12/16/12 162/78  10/21/12 174/71   Blood pressure a little high today but your word is good for better control at home.

## 2012-12-21 NOTE — Assessment & Plan Note (Signed)
She wants to seek help from a different dermatologist.  Plan  will refer you to Dr. Para Skeans for consultation

## 2013-01-06 ENCOUNTER — Telehealth: Payer: Self-pay

## 2013-01-06 NOTE — Telephone Encounter (Signed)
Phone call from patient stating she has had a dry cough for weeks and it not getting any relief. Please advise.

## 2013-01-06 NOTE — Telephone Encounter (Signed)
1st step - increase prilosec to 40 mg every AM (2 x 20 mg tablets). If no better by Monday - will need ov.

## 2013-01-07 NOTE — Telephone Encounter (Signed)
Patient returned call and she was advised to increase her Prilosec to 40 mg (2 tabs) daily.

## 2013-01-07 NOTE — Telephone Encounter (Signed)
Left message to return call 

## 2013-01-09 ENCOUNTER — Encounter (HOSPITAL_COMMUNITY): Payer: Self-pay | Admitting: Emergency Medicine

## 2013-01-09 ENCOUNTER — Emergency Department (HOSPITAL_COMMUNITY): Payer: Medicare Other

## 2013-01-09 ENCOUNTER — Inpatient Hospital Stay (HOSPITAL_COMMUNITY): Payer: Medicare Other

## 2013-01-09 ENCOUNTER — Inpatient Hospital Stay (HOSPITAL_COMMUNITY)
Admission: EM | Admit: 2013-01-09 | Discharge: 2013-01-17 | DRG: 291 | Disposition: A | Discharge status: Hospice | Payer: Medicare Other | Attending: Internal Medicine | Admitting: Internal Medicine

## 2013-01-09 DIAGNOSIS — I129 Hypertensive chronic kidney disease with stage 1 through stage 4 chronic kidney disease, or unspecified chronic kidney disease: Secondary | ICD-10-CM | POA: Diagnosis present

## 2013-01-09 DIAGNOSIS — I739 Peripheral vascular disease, unspecified: Secondary | ICD-10-CM | POA: Diagnosis present

## 2013-01-09 DIAGNOSIS — I4891 Unspecified atrial fibrillation: Secondary | ICD-10-CM | POA: Diagnosis present

## 2013-01-09 DIAGNOSIS — Z7982 Long term (current) use of aspirin: Secondary | ICD-10-CM

## 2013-01-09 DIAGNOSIS — N189 Chronic kidney disease, unspecified: Secondary | ICD-10-CM | POA: Diagnosis present

## 2013-01-09 DIAGNOSIS — Z79899 Other long term (current) drug therapy: Secondary | ICD-10-CM

## 2013-01-09 DIAGNOSIS — Z7902 Long term (current) use of antithrombotics/antiplatelets: Secondary | ICD-10-CM | POA: Diagnosis not present

## 2013-01-09 DIAGNOSIS — E871 Hypo-osmolality and hyponatremia: Secondary | ICD-10-CM | POA: Diagnosis present

## 2013-01-09 DIAGNOSIS — K551 Chronic vascular disorders of intestine: Secondary | ICD-10-CM | POA: Diagnosis present

## 2013-01-09 DIAGNOSIS — D509 Iron deficiency anemia, unspecified: Secondary | ICD-10-CM | POA: Diagnosis present

## 2013-01-09 DIAGNOSIS — N179 Acute kidney failure, unspecified: Secondary | ICD-10-CM | POA: Diagnosis present

## 2013-01-09 DIAGNOSIS — E785 Hyperlipidemia, unspecified: Secondary | ICD-10-CM | POA: Diagnosis present

## 2013-01-09 DIAGNOSIS — J9690 Respiratory failure, unspecified, unspecified whether with hypoxia or hypercapnia: Secondary | ICD-10-CM | POA: Diagnosis present

## 2013-01-09 DIAGNOSIS — Z8249 Family history of ischemic heart disease and other diseases of the circulatory system: Secondary | ICD-10-CM

## 2013-01-09 DIAGNOSIS — I428 Other cardiomyopathies: Secondary | ICD-10-CM | POA: Diagnosis present

## 2013-01-09 DIAGNOSIS — E039 Hypothyroidism, unspecified: Secondary | ICD-10-CM | POA: Diagnosis present

## 2013-01-09 DIAGNOSIS — I6529 Occlusion and stenosis of unspecified carotid artery: Secondary | ICD-10-CM | POA: Diagnosis present

## 2013-01-09 DIAGNOSIS — Z515 Encounter for palliative care: Secondary | ICD-10-CM | POA: Diagnosis not present

## 2013-01-09 DIAGNOSIS — E876 Hypokalemia: Secondary | ICD-10-CM | POA: Diagnosis present

## 2013-01-09 DIAGNOSIS — I4892 Unspecified atrial flutter: Secondary | ICD-10-CM | POA: Diagnosis present

## 2013-01-09 DIAGNOSIS — N183 Chronic kidney disease, stage 3 unspecified: Secondary | ICD-10-CM | POA: Diagnosis present

## 2013-01-09 DIAGNOSIS — Z681 Body mass index (BMI) 19 or less, adult: Secondary | ICD-10-CM | POA: Diagnosis not present

## 2013-01-09 DIAGNOSIS — F411 Generalized anxiety disorder: Secondary | ICD-10-CM | POA: Diagnosis present

## 2013-01-09 DIAGNOSIS — R05 Cough: Secondary | ICD-10-CM | POA: Diagnosis present

## 2013-01-09 DIAGNOSIS — J189 Pneumonia, unspecified organism: Secondary | ICD-10-CM | POA: Diagnosis present

## 2013-01-09 DIAGNOSIS — I5023 Acute on chronic systolic (congestive) heart failure: Principal | ICD-10-CM | POA: Diagnosis present

## 2013-01-09 DIAGNOSIS — J9600 Acute respiratory failure, unspecified whether with hypoxia or hypercapnia: Secondary | ICD-10-CM | POA: Diagnosis present

## 2013-01-09 DIAGNOSIS — Z8673 Personal history of transient ischemic attack (TIA), and cerebral infarction without residual deficits: Secondary | ICD-10-CM

## 2013-01-09 DIAGNOSIS — R4789 Other speech disturbances: Secondary | ICD-10-CM

## 2013-01-09 DIAGNOSIS — I509 Heart failure, unspecified: Secondary | ICD-10-CM

## 2013-01-09 DIAGNOSIS — R0603 Acute respiratory distress: Secondary | ICD-10-CM | POA: Diagnosis present

## 2013-01-09 DIAGNOSIS — I2510 Atherosclerotic heart disease of native coronary artery without angina pectoris: Secondary | ICD-10-CM | POA: Diagnosis present

## 2013-01-09 DIAGNOSIS — R4781 Slurred speech: Secondary | ICD-10-CM | POA: Diagnosis present

## 2013-01-09 DIAGNOSIS — R64 Cachexia: Secondary | ICD-10-CM | POA: Diagnosis present

## 2013-01-09 DIAGNOSIS — I059 Rheumatic mitral valve disease, unspecified: Secondary | ICD-10-CM | POA: Diagnosis present

## 2013-01-09 DIAGNOSIS — I5021 Acute systolic (congestive) heart failure: Secondary | ICD-10-CM | POA: Diagnosis present

## 2013-01-09 DIAGNOSIS — K219 Gastro-esophageal reflux disease without esophagitis: Secondary | ICD-10-CM | POA: Diagnosis present

## 2013-01-09 DIAGNOSIS — I5043 Acute on chronic combined systolic (congestive) and diastolic (congestive) heart failure: Secondary | ICD-10-CM

## 2013-01-09 DIAGNOSIS — E875 Hyperkalemia: Secondary | ICD-10-CM

## 2013-01-09 DIAGNOSIS — K209 Esophagitis, unspecified without bleeding: Secondary | ICD-10-CM

## 2013-01-09 DIAGNOSIS — I10 Essential (primary) hypertension: Secondary | ICD-10-CM | POA: Diagnosis present

## 2013-01-09 DIAGNOSIS — Z66 Do not resuscitate: Secondary | ICD-10-CM | POA: Diagnosis present

## 2013-01-09 DIAGNOSIS — I5033 Acute on chronic diastolic (congestive) heart failure: Secondary | ICD-10-CM

## 2013-01-09 LAB — PRO B NATRIURETIC PEPTIDE: Pro B Natriuretic peptide (BNP): 19832 pg/mL — ABNORMAL HIGH (ref 0–450)

## 2013-01-09 LAB — CBC WITH DIFFERENTIAL/PLATELET
Hemoglobin: 12.3 g/dL (ref 12.0–15.0)
Lymphocytes Relative: 21 % (ref 12–46)
Lymphs Abs: 1.7 10*3/uL (ref 0.7–4.0)
MCV: 94.2 fL (ref 78.0–100.0)
Monocytes Relative: 4 % (ref 3–12)
Neutro Abs: 5.9 10*3/uL (ref 1.7–7.7)
Neutrophils Relative %: 74 % (ref 43–77)
Platelets: 430 10*3/uL — ABNORMAL HIGH (ref 150–400)
RBC: 3.81 MIL/uL — ABNORMAL LOW (ref 3.87–5.11)
WBC: 7.9 10*3/uL (ref 4.0–10.5)

## 2013-01-09 LAB — URINALYSIS, ROUTINE W REFLEX MICROSCOPIC
Bilirubin Urine: NEGATIVE
Hgb urine dipstick: NEGATIVE
Ketones, ur: NEGATIVE mg/dL
Nitrite: NEGATIVE
Protein, ur: NEGATIVE mg/dL
Urobilinogen, UA: 0.2 mg/dL (ref 0.0–1.0)

## 2013-01-09 LAB — COMPREHENSIVE METABOLIC PANEL
ALT: 10 U/L (ref 0–35)
Alkaline Phosphatase: 60 U/L (ref 39–117)
BUN: 83 mg/dL — ABNORMAL HIGH (ref 6–23)
CO2: 24 mEq/L (ref 19–32)
GFR calc Af Amer: 20 mL/min — ABNORMAL LOW (ref >90)
GFR calc non Af Amer: 17 mL/min — ABNORMAL LOW (ref >90)
Glucose, Bld: 148 mg/dL — ABNORMAL HIGH (ref 70–99)
Potassium: 3.4 mEq/L — ABNORMAL LOW (ref 3.5–5.1)
Sodium: 129 mEq/L — ABNORMAL LOW (ref 135–145)
Total Bilirubin: 0.3 mg/dL (ref 0.3–1.2)

## 2013-01-09 LAB — CREATININE, URINE, RANDOM: Creatinine, Urine: 41.03 mg/dL

## 2013-01-09 LAB — SODIUM, URINE, RANDOM: Sodium, Ur: 32 mEq/L

## 2013-01-09 LAB — POCT I-STAT TROPONIN I

## 2013-01-09 LAB — MAGNESIUM: Magnesium: 2 mg/dL (ref 1.5–2.5)

## 2013-01-09 MED ORDER — GUAIFENESIN ER 600 MG PO TB12
1200.0000 mg | ORAL_TABLET | Freq: Two times a day (BID) | ORAL | Status: DC
  Administered 2013-01-09 – 2013-01-17 (×16): 1200 mg via ORAL
  Filled 2013-01-09 (×17): qty 2

## 2013-01-09 MED ORDER — FUROSEMIDE 10 MG/ML IJ SOLN
40.0000 mg | Freq: Two times a day (BID) | INTRAMUSCULAR | Status: DC
  Administered 2013-01-09 – 2013-01-12 (×6): 40 mg via INTRAVENOUS
  Filled 2013-01-09 (×8): qty 4

## 2013-01-09 MED ORDER — POTASSIUM CHLORIDE CRYS ER 20 MEQ PO TBCR
40.0000 meq | EXTENDED_RELEASE_TABLET | Freq: Once | ORAL | Status: AC
  Administered 2013-01-09: 40 meq via ORAL
  Filled 2013-01-09: qty 2

## 2013-01-09 MED ORDER — SODIUM CHLORIDE 0.9 % IJ SOLN
3.0000 mL | Freq: Two times a day (BID) | INTRAMUSCULAR | Status: DC
  Administered 2013-01-10: 11:00:00 3 mL via INTRAVENOUS
  Administered 2013-01-11: 16:00:00 via INTRAVENOUS
  Administered 2013-01-13 – 2013-01-17 (×5): 3 mL via INTRAVENOUS

## 2013-01-09 MED ORDER — METOPROLOL TARTRATE 100 MG PO TABS
100.0000 mg | ORAL_TABLET | Freq: Two times a day (BID) | ORAL | Status: DC
  Administered 2013-01-09 – 2013-01-17 (×16): 100 mg via ORAL
  Filled 2013-01-09 (×17): qty 1

## 2013-01-09 MED ORDER — HEPARIN SODIUM (PORCINE) 5000 UNIT/ML IJ SOLN
5000.0000 [IU] | Freq: Three times a day (TID) | INTRAMUSCULAR | Status: DC
  Administered 2013-01-09 – 2013-01-10 (×2): 5000 [IU] via SUBCUTANEOUS
  Filled 2013-01-09 (×5): qty 1

## 2013-01-09 MED ORDER — DEXTROSE 5 % IV SOLN
500.0000 mg | Freq: Once | INTRAVENOUS | Status: AC
  Administered 2013-01-09: 500 mg via INTRAVENOUS

## 2013-01-09 MED ORDER — SODIUM CHLORIDE 0.9 % IJ SOLN
3.0000 mL | Freq: Two times a day (BID) | INTRAMUSCULAR | Status: DC
  Administered 2013-01-09 – 2013-01-17 (×16): 3 mL via INTRAVENOUS

## 2013-01-09 MED ORDER — DEXTROSE 5 % IV SOLN
1.0000 g | INTRAVENOUS | Status: DC
  Administered 2013-01-10 – 2013-01-11 (×2): 1 g via INTRAVENOUS
  Filled 2013-01-09 (×3): qty 10

## 2013-01-09 MED ORDER — DEXTROSE 5 % IV SOLN
500.0000 mg | INTRAVENOUS | Status: DC
  Administered 2013-01-10: 500 mg via INTRAVENOUS
  Filled 2013-01-09 (×2): qty 500

## 2013-01-09 MED ORDER — HEPARIN SODIUM (PORCINE) 5000 UNIT/ML IJ SOLN: 5000.0000 [IU] | Freq: Three times a day (TID) | INTRAMUSCULAR | Status: DC

## 2013-01-09 MED ORDER — ACETAMINOPHEN 325 MG PO TABS
650.0000 mg | ORAL_TABLET | ORAL | Status: DC | PRN
  Filled 2013-01-09: qty 2

## 2013-01-09 MED ORDER — FUROSEMIDE 10 MG/ML IJ SOLN
40.0000 mg | Freq: Once | INTRAMUSCULAR | Status: AC
  Administered 2013-01-09: 40 mg via INTRAVENOUS
  Filled 2013-01-09: qty 4

## 2013-01-09 MED ORDER — FEBUXOSTAT 40 MG PO TABS
80.0000 mg | ORAL_TABLET | Freq: Every day | ORAL | Status: DC
  Administered 2013-01-09 – 2013-01-17 (×9): 80 mg via ORAL
  Filled 2013-01-09 (×8): qty 2
  Filled 2013-01-09: qty 1

## 2013-01-09 MED ORDER — DEXTROSE 5 % IV SOLN
1.0000 g | Freq: Once | INTRAVENOUS | Status: AC
  Administered 2013-01-09: 1 g via INTRAVENOUS
  Filled 2013-01-09: qty 10

## 2013-01-09 MED ORDER — SODIUM CHLORIDE 0.9 % IV SOLN: 250.0000 mL | INTRAVENOUS | Status: DC | PRN

## 2013-01-09 MED ORDER — SODIUM CHLORIDE 0.9 % IJ SOLN: 3.0000 mL | INTRAMUSCULAR | Status: DC | PRN

## 2013-01-09 MED ORDER — ASPIRIN 81 MG PO TABS: 81.0000 mg | ORAL_TABLET | Freq: Every day | ORAL | Status: DC

## 2013-01-09 MED ORDER — ASPIRIN 81 MG PO CHEW
81.0000 mg | CHEWABLE_TABLET | Freq: Every day | ORAL | Status: DC
  Administered 2013-01-09 – 2013-01-17 (×9): 81 mg via ORAL
  Filled 2013-01-09 (×8): qty 1

## 2013-01-09 MED ORDER — FOLIC ACID 1 MG PO TABS
1.0000 mg | ORAL_TABLET | Freq: Every day | ORAL | Status: DC
  Administered 2013-01-10 – 2013-01-17 (×8): 1 mg via ORAL
  Filled 2013-01-09 (×8): qty 1

## 2013-01-09 MED ORDER — LEVOTHYROXINE SODIUM 75 MCG PO TABS
75.0000 ug | ORAL_TABLET | Freq: Every day | ORAL | Status: DC
  Administered 2013-01-10 – 2013-01-17 (×8): 75 ug via ORAL
  Filled 2013-01-09 (×9): qty 1

## 2013-01-09 MED ORDER — PANTOPRAZOLE SODIUM 40 MG PO TBEC
40.0000 mg | DELAYED_RELEASE_TABLET | Freq: Every day | ORAL | Status: DC
  Administered 2013-01-10 – 2013-01-17 (×8): 40 mg via ORAL
  Filled 2013-01-09 (×8): qty 1

## 2013-01-09 MED ORDER — CLOPIDOGREL BISULFATE 75 MG PO TABS
75.0000 mg | ORAL_TABLET | Freq: Every day | ORAL | Status: DC
  Administered 2013-01-09 – 2013-01-17 (×9): 75 mg via ORAL
  Filled 2013-01-09 (×9): qty 1

## 2013-01-09 MED ORDER — ONDANSETRON HCL 4 MG/2ML IJ SOLN: 4.0000 mg | Freq: Four times a day (QID) | INTRAMUSCULAR | Status: DC | PRN

## 2013-01-09 NOTE — ED Provider Notes (Addendum)
CSN: [id number]     Arrival date & time 01/09/13  1310 History   First MD Initiated Contact with Patient 01/09/13 1312     Chief Complaint  Patient presents with  . Shortness of Breath  . Weakness   (Consider location/radiation/quality/duration/timing/severity/associated sxs/prior Treatment) The history is provided by the patient.  AMANTHA SKLAR is a 79 y.o. female hx of CKD, PVD, aflutter here with weakness. Diffuse weakness and decreased PO intake for several days. No vomiting or abdominal pain. She also has shortness of breath and dry cough for several days. Denies fevers or chest pain. She came by EMS. As per EMS O2 was 91% on arrival and placed on O2, nl CBG.    Past Medical History  Diagnosis Date  . Chronic kidney disease     chronic  . Cardiomyopathy     EF 40%.    Marland Kitchen Dyspepsia   . Peripheral vascular disease   . 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 Thyroid disease     hypothyroidism  . Hypertension   . Renovascular hypertension     s/p stenting by Dr Samule Ohm  . Chronic systolic dysfunction of left ventricle   . Arthritis   . CVA (cerebral vascular accident) 06/2005  . Mitral regurgitation   . Hiatal hernia   . Atrial flutter March 2011    s/p CTI ablation by Dr Ladona Ridgel  . Anemia   . Torn rotator cuff     right   Past Surgical History  Procedure Laterality Date  . Cardiac catheterization    . Cholecystectomy      laporascopic  . Abdominal hysterectomy    . Oophorectomy    . Tonsillectomy    . Ras stents  2005    bilateral  . Sma stenosis      NOv '12 stented  . Eye surgery     Family History  Problem Relation Age of Onset  . Heart disease Mother   . Hypertension Father   . Diabetes Brother    History  Substance Use Topics  . Smoking status: Never Smoker   . Smokeless tobacco: Never Used  . Alcohol Use: No   OB History   Grav Para Term Preterm Abortions TAB SAB Ect Mult Living                  Review of Systems  Respiratory: Positive for shortness of breath.   Neurological: Positive for weakness.  All other systems reviewed and are negative.    Allergies  Amoxicillin-pot clavulanate; Amoxicillin-pot clavulanate; Clonidine derivatives; Moxifloxacin; Sulfa antibiotics; Sulfamethoxazole; and Sulfonamide derivatives  Home Medications   Current Outpatient Rx  Name  Route  Sig  Dispense  Refill  . aspirin 81 MG tablet   Oral   Take 81 mg by mouth daily.           . Cholecalciferol (VITAMIN D3) 3000 UNITS TABS   Oral   Take 100 mg by mouth daily.         . clopidogrel (PLAVIX) 75 MG tablet   Oral   Take 1 tablet (75 mg total) by mouth daily.   30 tablet   6   . Febuxostat (ULORIC) 80 MG TABS   Oral   Take 80 mg by mouth daily.         . folic acid (FOLVITE) 1 MG tablet   Oral   Take 1 mg  by mouth daily.         . furosemide (LASIX) 80 MG tablet   Oral   Take 80 mg by mouth daily. Take 80mg  in AM and 40mg  in afternoon         . guaiFENesin (MUCINEX) 600 MG 12 hr tablet   Oral   Take 1,200 mg by mouth 2 (two) times daily.         Marland Kitchen levothyroxine (SYNTHROID, LEVOTHROID) 75 MCG tablet   Oral   Take 75 mcg by mouth daily before breakfast.         . methotrexate 2.5 MG tablet   Oral   Take 15 mg by mouth once a week. Takes 6 tablets  weekly         . metoprolol (LOPRESSOR) 100 MG tablet   Oral   Take 1 tablet (100 mg total) by mouth 2 (two) times daily.   60 tablet   6   . Multiple Vitamin (MULTIVITAMIN) capsule   Oral   Take 1 capsule by mouth daily.          Marland Kitchen omeprazole (PRILOSEC) 20 MG capsule   Oral   Take 20 mg by mouth daily.            BP 117/69  Pulse 72  Temp(Src) 97.7 F (36.5 C) (Oral)  Resp 21  Ht 5\' 1"  (1.549 m)  Wt 113 lb (51.256 kg)  BMI 21.36 kg/m2  SpO2 98% Physical Exam  Nursing note and vitals reviewed. Constitutional: She is oriented to person, place, and time.  Chronically ill,  slightly tachypneic, talking in full sentences   HENT:  Head: Normocephalic.  Mouth/Throat: Oropharynx is clear and moist.  Eyes: Conjunctivae are normal. Pupils are equal, round, and reactive to light.  Neck: Normal range of motion. Neck supple.  Cardiovascular: Normal rate, regular rhythm and normal heart sounds.   Pulmonary/Chest: Effort normal.  Crackles bilateral bases, worse on L base   Abdominal: Soft. Bowel sounds are normal. She exhibits no distension. There is no tenderness. There is no rebound and no guarding.  Musculoskeletal: Normal range of motion.  Neurological: She is alert and oriented to person, place, and time. No cranial nerve deficit. Coordination normal.  Skin: Skin is warm and dry.  Psychiatric: She has a normal mood and affect. Her behavior is normal. Judgment and thought content normal.    ED Course  Procedures (including critical care time) Labs Review Labs Reviewed  CBC WITH DIFFERENTIAL - Abnormal; Notable for the following:    RBC 3.81 (*)    HCT 35.9 (*)    Platelets 430 (*)    All other components within normal limits  COMPREHENSIVE METABOLIC PANEL - Abnormal; Notable for the following:    Sodium 129 (*)    Potassium 3.4 (*)    Chloride 92 (*)    Glucose, Bld 148 (*)    BUN 83 (*)    Creatinine, Ser 2.37 (*)    Calcium 11.8 (*)    Total Protein 5.7 (*)    Albumin 2.6 (*)    GFR calc non Af Amer 17 (*)    GFR calc Af Amer 20 (*)    All other components within normal limits  PRO B NATRIURETIC PEPTIDE - Abnormal; Notable for the following:    Pro B Natriuretic peptide (BNP) 19832.0 (*)    All other components within normal limits  URINALYSIS, ROUTINE W REFLEX MICROSCOPIC - Abnormal; Notable for the following:  APPearance CLOUDY (*)    All other components within normal limits  POCT I-STAT TROPONIN I   Imaging Review Dg Chest Portable 1 View  01/09/2013   CLINICAL DATA:  Shortness of Breath  EXAM: PORTABLE CHEST - 1 VIEW  COMPARISON:  June 18, 2009  FINDINGS: There is diffuse interstitial edema. There is patchy airspace consolidation in the bases. Heart is enlarged with pulmonary venous hypertension. No adenopathy.  There is atherosclerotic change in aorta. There is extensive arthropathy in the right shoulder with chronic rotator cuff tear on the right.  IMPRESSION: Congestive heart failure. Question superimposed pneumonia in the bases. Chronic rotator cuff tear on the right.   Electronically Signed   By: Bretta Bang M.D.   On: 01/09/2013 13:42    EKG Interpretation    Date/Time:  Thursday January 09 2013 13:14:25 EST Ventricular Rate:  90 PR Interval:  179 QRS Duration: 107 QT Interval:  397 QTC Calculation: 486 R Axis:   72 Text Interpretation:  Sinus rhythm Multiform ventricular premature complexes RSR' in V1 or V2, probably normal variant Probable left ventricular hypertrophy PVC new since previous  Confirmed by Narciso Stoutenburg  MD, Desiderio Dolata [phone]) on 01/09/2013 3:27:31 PM            MDM   1. Respiratory failure   2. CHF (congestive heart failure)   3. Community acquired pneumonia    REID NAWROT is a 79 y.o. female here with weakness, SOB. Will consider sepsis, likely pneumonia. Will get BNP to r/o CHF as well.   2:30 PM cxr showed chf and possible pneumonia. I ordered ceftriaxone, azithro. BNP elevated, Cr 2.4 (double from baseline). I called Dr. Janee Morn, who recommend diuresing with lasix. Will admit for CHF, pneumonia, respiratory failure.    Richardean Canal, MD 01/09/13 1436  Richardean Canal, MD 01/09/13 [phone]

## 2013-01-09 NOTE — ED Notes (Signed)
Report called to floor. Pt to have CT head prior to going to room

## 2013-01-09 NOTE — ED Notes (Signed)
Bilateral lower extremity swelling 1+ pitting edema in ankles.  Moderate pedis pulses.

## 2013-01-09 NOTE — H&P (Signed)
[facility] History and Physical  Elizabeth Wall:[id number] DOB: 15-Dec-1919 DOA: 01/09/2013  Referring physician: Dr Silverio Lay PCP: Illene Regulus, MD   Chief Complaint: SOB/Weakness  HPI: Elizabeth Wall is a 79 y.o. female  With hx of CAD, CM, CKD baseline cr 1.5-2, CVA, hx of afib on ASA and plavix, PVD, mesenteric ischemia s/p intervention who presents to ED with 1 week hx of dry cough, weakness, SOB, chills, nausea, HA, slurred speech. Info given by son and patient. Patient denies any CP, no fever, no emesis, no dysuria, no abdominal pain, no facial droop, no asymetric weakness, no other associated symptoms. Per son patient's BP decreased and as such patient held beta blocker resulting in HR in 130s and subsequently resumed per son. Patient seen in ED CXR c/w pulmonary edema and probable bibasilar infiltrates. [place] [address]. UA negative. BMET with sodium 129, potassium 3.4, BUN/CR 83/2.37. CBC with platelets 430. EKG with NSR with PVCs. Patient given IV Rocephin and azithromycin. We were called to admit patient for further evaluation and rxcs.   Review of Systems:  Constitutional:  No weight loss, night sweats, Fevers, fatigue.  HEENT:  No headaches, Difficulty swallowing,Tooth/dental problems,Sore throat,  No sneezing, itching, ear ache, nasal congestion, post nasal drip,  Cardio-vascular:  No chest pain, Orthopnea, PND, swelling in lower extremities, anasarca, dizziness, palpitations  GI:  No heartburn, indigestion, abdominal pain, nausea, vomiting, diarrhea, change in bowel habits, loss of appetite  Resp:  No shortness of breath with exertion or at rest. No excess mucus, no productive cough, No non-productive cough, No coughing up of blood.No change in color of mucus.No wheezing.No chest wall deformity  Skin:  no rash or lesions.  GU:  no dysuria, change in color of urine, no urgency or frequency. No flank pain.  Musculoskeletal:  No joint pain or swelling.  No decreased range of motion. No back pain.  Psych:  No change in mood or affect. No depression or anxiety. No memory loss.   Past Medical History  Diagnosis Date  . Chronic kidney disease     chronic  . Cardiomyopathy     EF 40%.    Marland Kitchen Dyspepsia   . Peripheral vascular disease   . 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 Thyroid disease     hypothyroidism  . Hypertension   . Renovascular hypertension     s/p stenting by Dr Samule Ohm  . Chronic systolic dysfunction of left ventricle   . Arthritis   . CVA (cerebral vascular accident) 06/2005  . Mitral regurgitation   . Hiatal hernia   . Atrial flutter March 2011    s/p CTI ablation by Dr Ladona Ridgel  . Anemia   . Torn rotator cuff     right   Past Surgical History  Procedure Laterality Date  . Cardiac catheterization    . Cholecystectomy      laporascopic  . Abdominal hysterectomy    . Oophorectomy    . Tonsillectomy    . Ras stents  2005    bilateral  . Sma stenosis      NOv '12 stented  . Eye surgery     Social History:  reports that she has never smoked. She has never used smokeless tobacco. She reports that she does not drink alcohol or use illicit drugs.  Allergies  Allergen Reactions  . Amoxicillin-Pot Clavulanate Other (See Comments)     unknown  .  Amoxicillin-Pot Clavulanate Diarrhea  . Clonidine Derivatives Other (See Comments)    Blood pressure dropped too low  . Moxifloxacin Other (See Comments)    REACTION: patient developed tremors and vibrato  . Sulfa Antibiotics Nausea Only  . Sulfamethoxazole Other (See Comments)    REACTION: unspecified  . Sulfonamide Derivatives Nausea Only    Family History  Problem Relation Age of Onset  . Heart disease Mother   . Hypertension Father   . Diabetes Brother      Prior to Admission medications   Medication Sig Start Date End Date Taking? Authorizing Provider  aspirin 81 MG tablet Take 81 mg by  mouth daily.     Yes Historical Provider, MD  Cholecalciferol (VITAMIN D3) 3000 UNITS TABS Take 100 mg by mouth daily.   Yes Historical Provider, MD  clopidogrel (PLAVIX) 75 MG tablet Take 1 tablet (75 mg total) by mouth daily. 07/17/12  Yes Iran Ouch, MD  Febuxostat (ULORIC) 80 MG TABS Take 80 mg by mouth daily.   Yes Historical Provider, MD  folic acid (FOLVITE) 1 MG tablet Take 1 mg by mouth daily.   Yes Historical Provider, MD  furosemide (LASIX) 80 MG tablet Take 80 mg by mouth daily. Take 80mg  in AM and 40mg  in afternoon   Yes Historical Provider, MD  guaiFENesin (MUCINEX) 600 MG 12 hr tablet Take 1,200 mg by mouth 2 (two) times daily.   Yes Historical Provider, MD  levothyroxine (SYNTHROID, LEVOTHROID) 75 MCG tablet Take 75 mcg by mouth daily before breakfast.   Yes Historical Provider, MD  methotrexate 2.5 MG tablet Take 15 mg by mouth once a week. Takes 6 tablets  weekly   Yes Historical Provider, MD  metoprolol (LOPRESSOR) 100 MG tablet Take 1 tablet (100 mg total) by mouth 2 (two) times daily. 06/14/12  Yes Iran Ouch, MD  Multiple Vitamin (MULTIVITAMIN) capsule Take 1 capsule by mouth daily.    Yes Historical Provider, MD  omeprazole (PRILOSEC) 20 MG capsule Take 20 mg by mouth daily.     Yes Historical Provider, MD   Physical Exam: Filed Vitals:   01/09/13 1500  BP: 117/69  Pulse: 72  Temp:   Resp: 21    BP 117/69  Pulse 72  Temp(Src) 97.7 F (36.5 C) (Oral)  Resp 21  Ht 5\' 1"  (1.549 m)  Wt 51.256 kg (113 lb)  BMI 21.36 kg/m2  SpO2 98%  General:  Appears calm and comfortable Eyes: PERRL, normal lids, irises & conjunctiva ENT: grossly normal hearing, lips & tongue Neck: no LAD, masses or thyromegaly Cardiovascular: RRR, no m/r/g. 1-2 + LE edema. + JVD Telemetry: irregularly irregular Respiratory: Bibasilar crackles and coarse BS. No wheezing. Abdomen: soft, nt,nd, +BS Skin: no rash or induration seen on limited exam Musculoskeletal: grossly normal tone  BUE/BLE Psychiatric: grossly normal mood and affect. Neurologic: Sluured speech. CN 2-12 grossly intact. Visual fields intact. Sensation intact. Gait not tested secondary to safety.           Labs on Admission:  Basic Metabolic Panel:  Recent Labs Lab 01/09/13 1314  NA 129*  K 3.4*  CL 92*  CO2 24  GLUCOSE 148*  BUN 83*  CREATININE 2.37*  CALCIUM 11.8*   Liver Function Tests:  Recent Labs Lab 01/09/13 1314  AST 26  ALT 10  ALKPHOS 60  BILITOT 0.3  PROT 5.7*  ALBUMIN 2.6*   No results found for this basename: LIPASE, AMYLASE,  in the last 168 hours No  results found for this basename: AMMONIA,  in the last 168 hours CBC:  Recent Labs Lab 01/09/13 1314  WBC 7.9  NEUTROABS 5.9  HGB 12.3  HCT 35.9*  MCV 94.2  PLT 430*   Cardiac Enzymes: No results found for this basename: CKTOTAL, CKMB, CKMBINDEX, TROPONINI,  in the last 168 hours  BNP (last 3 results)  Recent Labs  01/09/13 1314  PROBNP 19832.0*   CBG: No results found for this basename: GLUCAP,  in the last 168 hours  Radiological Exams on Admission: Dg Chest Portable 1 View  01/09/2013   CLINICAL DATA:  Shortness of Breath  EXAM: PORTABLE CHEST - 1 VIEW  COMPARISON:  June 18, 2009  FINDINGS: There is diffuse interstitial edema. There is patchy airspace consolidation in the bases. Heart is enlarged with pulmonary venous hypertension. No adenopathy.  There is atherosclerotic change in aorta. There is extensive arthropathy in the right shoulder with chronic rotator cuff tear on the right.  IMPRESSION: Congestive heart failure. Question superimposed pneumonia in the bases. Chronic rotator cuff tear on the right.   Electronically Signed   By: Bretta Bang M.D.   On: 01/09/2013 13:42    EKG: Independently reviewed. NSR with occasional PVCs  Assessment/Plan Principal Problem:   Respiratory failure Active Problems:   HYPOTHYROIDISM   ANXIETY   HYPERTENSION   CORONARY ARTERY DISEASE   CAROTID  ARTERY STENOSIS, WITHOUT INFARCTION   GASTROESOPHAGEAL REFLUX DISEASE   CHRONIC KIDNEY DISEASE UNSPECIFIED   Atrial flutter   Iron deficiency anemia   Hyperlipidemia   Mesenteric artery insufficiency   Acute systolic CHF (congestive heart failure)   CAP (community acquired pneumonia): probable   Slurred speech   Acute on chronic renal failure   Acute respiratory distress   Hyponatremia  1. Acute respiratory Failure Secondary to acute on chronic CHF exac and prob CAP. Patient currently on [place]. Admit to telemetry. Cycle cardiac enzymes, check 2 D echo, check sputum gram stain and culture, check TSH, check influenza panel. Place on IV lasix 40MG  bid, continue beta blocker, ASA, plavix. Start on IV Rocephin and azithromycin, mucinex. Follow.  2. Acute on chronic CHF exac ?? Etiology. Cycle cardiac enzymes, 2 d echo. Place IV Lasix 40mg  BID, asa, beta blocker. Strict I/O. Daily weights. If cardiac enzymes abnormal or 2 d echo abn will consult with cardiology.  3. Slurred speech Patient with history of afib, was off her beta blocker for few days with HR in 130s. Will check CT head. If CT negative will check MRI head. If abnormal will consult neurology. Continue ASA and plavix for now.  4. Prob CAP Will check sputum gram stain and culture. Place on oxygen, Mucinex, IV Rocephin, IV azithromycin. Follow.  5. Atrial fibrillation Currently rate controlled. Continue beta blocker for rate control. Continue aspirin and Plavix.  6. Acute on chronic kidney disease Baseline creatinine 1.5-2. Likely prerenal azotemia secondary to acute on chronic CHF exacerbation. Check a urine sodium. Check a urine creatinine. Check a renal ultrasound. Diuresis IV Lasix. Follow. If kidney function worsens may consider a renal consultation.  7. Hypertension Stable. Continue beta blocker, Norvasc, Lasix.  8. Hypothyroidism Check a TSH. Continue home dose of Synthroid.  9. Hyponatremia Likely secondary to  hypervolemic hyponatremia. Check a urine sodium. Check a urine creatinine. Check a TSH. Check a CT head. Diuresis IV Lasix. Follow.  10. Coronary artery disease Stable. See problem #2. Continue home regimen of medications.  11. Prophylaxis PPI for GI prophylaxis. Heparin for  DVT prophylaxis.  Code Status: DO NOT RESUSCITATE Family Communication: Updated patient and son at bedside. Spoke with patient's other son via telephone. Disposition Plan: Admit to telemetry  Time spent: 70 minutes  [facility] M.D. [facility] Pager [phone]

## 2013-01-09 NOTE — ED Notes (Signed)
Family at bedside. 

## 2013-01-09 NOTE — ED Notes (Signed)
Pt present via [facility] with c/o of weakness and shortness of breath x couple of days.  Pt c/o of feeling nauseated and having a dry cough.  Per EMS, pt was had 91% saturation at home, placed on 4L 97%.  CBG 163, BP 118/88 and 80 pulse.

## 2013-01-09 NOTE — ED Notes (Addendum)
Report received, assumed care.  

## 2013-01-09 NOTE — ED Notes (Signed)
Admitting MD at bedside.

## 2013-01-10 ENCOUNTER — Inpatient Hospital Stay (HOSPITAL_COMMUNITY): Payer: Medicare Other

## 2013-01-10 DIAGNOSIS — J9600 Acute respiratory failure, unspecified whether with hypoxia or hypercapnia: Secondary | ICD-10-CM

## 2013-01-10 DIAGNOSIS — N179 Acute kidney failure, unspecified: Secondary | ICD-10-CM

## 2013-01-10 DIAGNOSIS — J189 Pneumonia, unspecified organism: Secondary | ICD-10-CM

## 2013-01-10 DIAGNOSIS — N189 Chronic kidney disease, unspecified: Secondary | ICD-10-CM

## 2013-01-10 DIAGNOSIS — I5023 Acute on chronic systolic (congestive) heart failure: Secondary | ICD-10-CM | POA: Diagnosis not present

## 2013-01-10 DIAGNOSIS — J984 Other disorders of lung: Secondary | ICD-10-CM

## 2013-01-10 DIAGNOSIS — I369 Nonrheumatic tricuspid valve disorder, unspecified: Secondary | ICD-10-CM

## 2013-01-10 DIAGNOSIS — I509 Heart failure, unspecified: Secondary | ICD-10-CM

## 2013-01-10 DIAGNOSIS — E785 Hyperlipidemia, unspecified: Secondary | ICD-10-CM

## 2013-01-10 LAB — CBC
HCT: 32.4 % — ABNORMAL LOW (ref 36.0–46.0)
Hemoglobin: 10.8 g/dL — ABNORMAL LOW (ref 12.0–15.0)
MCH: 31.8 pg (ref 26.0–34.0)
MCHC: 33.3 g/dL (ref 30.0–36.0)
MCV: 95.3 fL (ref 78.0–100.0)
RDW: 15.1 % (ref 11.5–15.5)

## 2013-01-10 LAB — INFLUENZA PANEL BY PCR (TYPE A & B): Influenza A By PCR: NEGATIVE

## 2013-01-10 LAB — BASIC METABOLIC PANEL
BUN: 81 mg/dL — ABNORMAL HIGH (ref 6–23)
CO2: 25 mEq/L (ref 19–32)
Calcium: 11.7 mg/dL — ABNORMAL HIGH (ref 8.4–10.5)
Chloride: 97 mEq/L (ref 96–112)
Creatinine, Ser: 2.15 mg/dL — ABNORMAL HIGH (ref 0.50–1.10)
GFR calc non Af Amer: 19 mL/min — ABNORMAL LOW (ref >90)
Glucose, Bld: 130 mg/dL — ABNORMAL HIGH (ref 70–99)

## 2013-01-10 LAB — TROPONIN I
Troponin I: 0.38 ng/mL (ref <0.30)
Troponin I: 0.37 ng/mL (ref <0.30)

## 2013-01-10 LAB — LEGIONELLA ANTIGEN, URINE: Legionella Antigen, Urine: NEGATIVE

## 2013-01-10 MED ORDER — ENOXAPARIN SODIUM 30 MG/0.3ML ~~LOC~~ SOLN
30.0000 mg | SUBCUTANEOUS | Status: DC
  Administered 2013-01-10 – 2013-01-17 (×8): 30 mg via SUBCUTANEOUS
  Filled 2013-01-10 (×8): qty 0.3

## 2013-01-10 MED ORDER — SODIUM CHLORIDE 0.45 % IV SOLN
INTRAVENOUS | Status: DC
  Administered 2013-01-10 – 2013-01-12 (×3): via INTRAVENOUS

## 2013-01-10 MED ORDER — BENZONATATE 100 MG PO CAPS
100.0000 mg | ORAL_CAPSULE | Freq: Three times a day (TID) | ORAL | Status: DC
  Administered 2013-01-10 – 2013-01-14 (×10): 100 mg via ORAL
  Filled 2013-01-10 (×22): qty 1

## 2013-01-10 NOTE — Progress Notes (Signed)
Pt. Alert and oriented this am. Emotional during the night. Pt. Stated she is upset because she does not know what's wrong with her and she can not do for herself.  Pt. Rhythm strip showing afib on telemetry. Pt. Rhythm afib/NSR during the night. Dr. Janee Morn, MD notified. Pt. With hx. Of afib. EKG obtained.  RN will continue to monitor pt. For changes in condition.Elizabeth Wall, Elizabeth Wall

## 2013-01-10 NOTE — Evaluation (Signed)
Physical Therapy Evaluation Patient Details Name: Elizabeth Wall MRN: [id number] DOB: February 03, 1919 Today's Date: 01/10/2013 Time: 8469-6295 PT Time Calculation (min): 25 min  PT Assessment / Plan / Recommendation History of Present Illness  Pt admit for respiratory failure.  Pt with possible flu.  Troponins up at present.  Clinical Impression  Pt admitted with above. Pt currently with functional limitations due to the deficits listed below (see PT Problem List).  Pt will benefit from skilled PT to increase their independence and safety with mobility to allow discharge to the venue listed below.      PT Assessment  Patient needs continued PT services    Follow Up Recommendations  SNF;Supervision/Assistance - 24 hour        Barriers to Discharge Decreased caregiver support only has caregiver a few hours in am    Equipment Recommendations  None recommended by PT         Frequency Min 3X/week    Precautions / Restrictions Precautions Precautions: Fall Restrictions Weight Bearing Restrictions: No   Pertinent Vitals/Pain VSS, no pain      Mobility  Bed Mobility Bed Mobility: Rolling Left;Left Sidelying to Sit;Sitting - Scoot to [place] of Bed Rolling Left: 4: Min assist;With rail Left Sidelying to Sit: 3: Mod assist;With rails;HOB elevated Sitting - Scoot to Edge of Bed: 4: Min assist Details for Bed Mobility Assistance: cues for technique and sequencing Transfers Transfers: Sit to Stand;Stand to [employer] Transfers Sit to Stand: 4: Min assist;With upper extremity assist;From bed Stand to Sit: 4: Min assist;With upper extremity assist;To chair/3-in-1;With armrests Stand Pivot Transfers: 3: Mod assist Details for Transfer Assistance: Nursing has been getting pt on bedside commode.  Pt had to get up and urinate.  PT evaluated and assisted onto commode and then to recliner.  Limited eval secondary to slightly elevated troponins.  Pt only min assist for sit to stand.  Once  up needed steadying assist throughout transfer.  Pt able to stand pivot with steadying asssit.Pt stood with bil HHA for the pivot transfer with assist for weight shifting.  Transferred pt to 3N1 and then to recliner.  Pt with flexed posture throughout.   Ambulation/Gait Ambulation/Gait Assistance: Not tested (comment) Stairs: No Wheelchair Mobility Wheelchair Mobility: No         PT Diagnosis: Generalized weakness  PT Problem List: Decreased activity tolerance;Decreased balance;Decreased mobility;Decreased knowledge of use of DME;Decreased safety awareness PT Treatment Interventions: DME instruction;Gait training;Functional mobility training;Therapeutic activities;Therapeutic exercise;Balance training;Patient/family education     PT Goals(Current goals can be found in the care plan section) Acute Rehab PT Goals Patient Stated Goal: to go home PT Goal Formulation: With patient Time For Goal Achievement: 01/17/13 Potential to Achieve Goals: Good  Visit Information  Last PT Received On: 01/10/13 Assistance Needed: +2 (for ambulation) History of Present Illness: Pt admit for respiratory failure.  Pt with possible flu.  Troponins up at present.       Prior Functioning  Home Living Family/patient expects to be discharged to:: Private residence Living Arrangements: Alone Available Help at Discharge: Personal care attendant;Available PRN/intermittently (Stays alone in pm and night.) Type of Home: House Home Access: Ramped entrance Home Layout: One level Home Equipment: Walker - 4 wheels;Bedside commode;Tub bench Prior Function Level of Independence: Independent with assistive device(s) Communication Communication: No difficulties    Cognition  Cognition Arousal/Alertness: Awake/alert Behavior During Therapy: Flat affect Overall Cognitive Status: Within Functional Limits for tasks assessed    Extremity/Trunk Assessment Upper Extremity Assessment Upper Extremity  Assessment:  Defer to OT evaluation Lower Extremity Assessment Lower Extremity Assessment: Generalized weakness   Balance Balance Balance Assessed: Yes Static Standing Balance Static Standing - Balance Support: Bilateral upper extremity supported;During functional activity Static Standing - Level of Assistance: 3: Mod assist Static Standing - Comment/# of Minutes: 2 min with steadying assist.  End of Session PT - End of Session Equipment Utilized During Treatment: Gait belt;Oxygen Activity Tolerance: Patient limited by fatigue Patient left: in chair;with call bell/phone within reach Nurse Communication: Mobility status       [name],Mercia Dowe 01/10/2013, 9:37 AM [facility] [phone] (pager)

## 2013-01-10 NOTE — Progress Notes (Signed)
Subjective: Sitting up in a chair eating breakfast. She is generally weak but in no acute distress. Her speech is cogent but slow to organize her thoughts  Objective: Lab:  Recent Labs  01/09/13 1314 01/10/13 0530  WBC 7.9 8.0  NEUTROABS 5.9  --   HGB 12.3 10.8*  HCT 35.9* 32.4*  MCV 94.2 95.3  PLT 430* 444*    Recent Labs  01/09/13 1314 01/09/13 1730 01/10/13 0530  NA 129*  --  133*  K 3.4*  --  4.1  CL 92*  --  97  GLUCOSE 148*  --  130*  BUN 83*  --  81*  CREATININE 2.37*  --  2.15*  CALCIUM 11.8*  --  11.7*  MG  --  2.0  --    Cardiac Panel (last 3 results)  Recent Labs  01/09/13 1730 01/09/13 2314 01/10/13 0530  TROPONINI <0.30 <0.30 0.55*   BNP 19855   TSH 1.099 EKG - NSR, no acute changes  Imaging: 12/25 CT brain: IMPRESSION:  Generalized atrophy, expected for age. No acute abnormality.  CXR 12/25: IMPRESSION:  Congestive heart failure. Question superimposed pneumonia in the  bases. Chronic rotator cuff tear on the right.  Feb '11 2 D echo: Study Conclusions  - Left ventricle: The cavity size was normal. Wall thickness was normal. Systolic function was mildly to moderately reduced. The estimated ejection fraction was in the range of 40% to 45%. Diffuse hypokinesis. - Mitral valve: Mild regurgitation. - Left atrium: The atrium was moderately dilated. - Right ventricle: The cavity size was mildly to moderately dilated. - Right atrium: The atrium was moderately dilated. - Tricuspid valve: Moderate regurgitation. - Pulmonary arteries: Systolic pressure was mildly increased. PA peak pressure: 36mm Hg (S). - Pericardium, extracardiac: A trivial pericardial effusion was identified.  Scheduled Meds: . aspirin  81 mg Oral Daily  . azithromycin  500 mg Intravenous Q24H  . cefTRIAXone (ROCEPHIN)  IV  1 g Intravenous Q24H  . clopidogrel  75 mg Oral Daily  . febuxostat  80 mg Oral Daily  . folic acid  1 mg Oral Daily  . furosemide  40 mg  Intravenous Q12H  . guaiFENesin  1,200 mg Oral BID  . heparin  5,000 Units Subcutaneous Q8H  . levothyroxine  75 mcg Oral QAC breakfast  . metoprolol  100 mg Oral BID  . pantoprazole  40 mg Oral Q0600  . sodium chloride  3 mL Intravenous Q12H  . sodium chloride  3 mL Intravenous Q12H   Continuous Infusions:  PRN Meds:.sodium chloride, sodium chloride, acetaminophen, ondansetron (ZOFRAN) IV, sodium chloride, sodium chloride   Physical Exam: Filed Vitals:   01/10/13 0700  BP: 120/94  Pulse: 71  Temp: 98.2 F (36.8 C)  Resp: 18   Gen'l- very old, frail woman in no acute distress HEENT- Temporal wasting, thinning hair, crooked teeth, C&S clear Cor 2+ radial, quiet precordium, RRR Pulm - no increased WOB, no rales or wheezes Neuro - A&O x 3, eating w/o assistance. Did not get up or do full neuro exam      Assessment/Plan: 1. Acute respiratory failure - on nasal cannula w/ no respiratory distress  2. Acute on Chronic ischemic/systolic heart failure. Last 2 D echo with EF 40% and global hypokinesis now with radiographic evidence of pulmonary edema and elevated BNP  Plan - gentle diuresis, since she is not in respiratory distress at this time.  3. Slurred speech - her speech is clear but her cognition is  a little slowed. CT Brain is negative. Given limited options there is value gained by further study, i.e. MRI. Question of new weakness.  Plan  PT/OT eval  Continue clopidrogel  4. ID/Pulm - possible CAP. Pneumonia panel pending. Day #2 Rocephin/Azithromycin  5. A. Fib - PAF - now in sinus Plan Continue present medical therapy  6. Renal - acute on chronic CKD. Problem with need to diuresis Plan 1/2 NS at 50 cc/hr  7. Hyponatremia - improved.  Code STatus - confirmed code status with patient and her son.   Illene Regulus  IM (o) [phone]; (c) [phone] Call-grp - [facility]  Tele: [phone]  01/10/2013, 9:31 AM

## 2013-01-10 NOTE — Progress Notes (Signed)
CRITICAL VALUE ALERT  Critical value received:  Troponin 0.55  Date of notification:  01/10/2013  Time of notification:  0630  Critical value read back:yes  Nurse who received alert:  Konrad Dolores   MD notified (1st page):  [facility] on call ([place]), Janee Morn paged  Time of first page:  [phone]  MD notified (2nd page):  Time of second page:  Responding MD:    Time MD responded:

## 2013-01-10 NOTE — Progress Notes (Signed)
  Echocardiogram 2D Echocardiogram has been performed.  Cathie Beams 01/10/2013, 10:36 AM

## 2013-01-10 NOTE — Progress Notes (Signed)
Pt. With critical troponin lab result. RN attempting to contact on call MD to notify. Annalyssa Thune, Cheryll Dessert'

## 2013-01-10 NOTE — Progress Notes (Signed)
Pt has a dry hacking cough.  MD notified and ordered tessalon pearls, will carry out MD orders and continue to monitor.

## 2013-01-11 ENCOUNTER — Inpatient Hospital Stay (HOSPITAL_COMMUNITY): Payer: Medicare Other

## 2013-01-11 DIAGNOSIS — E871 Hypo-osmolality and hyponatremia: Secondary | ICD-10-CM

## 2013-01-11 DIAGNOSIS — I5021 Acute systolic (congestive) heart failure: Secondary | ICD-10-CM

## 2013-01-11 DIAGNOSIS — I10 Essential (primary) hypertension: Secondary | ICD-10-CM

## 2013-01-11 LAB — BASIC METABOLIC PANEL
BUN: 76 mg/dL — ABNORMAL HIGH (ref 6–23)
Calcium: 11.8 mg/dL — ABNORMAL HIGH (ref 8.4–10.5)
GFR calc Af Amer: 28 mL/min — ABNORMAL LOW (ref >90)
GFR calc non Af Amer: 24 mL/min — ABNORMAL LOW (ref >90)
Glucose, Bld: 110 mg/dL — ABNORMAL HIGH (ref 70–99)
Potassium: 3.7 mEq/L (ref 3.5–5.1)

## 2013-01-11 LAB — TROPONIN I: Troponin I: 0.3 ng/mL (ref <0.30)

## 2013-01-11 MED ORDER — AZITHROMYCIN 500 MG PO TABS
500.0000 mg | ORAL_TABLET | ORAL | Status: DC
  Administered 2013-01-11: 500 mg via ORAL
  Filled 2013-01-11 (×2): qty 1

## 2013-01-11 MED ORDER — STARCH (THICKENING) PO POWD: ORAL | Status: DC | PRN

## 2013-01-11 MED ORDER — RESOURCE THICKENUP CLEAR PO POWD
ORAL | Status: DC | PRN
  Filled 2013-01-11: qty 125

## 2013-01-11 NOTE — Evaluation (Signed)
Clinical/Bedside Swallow Evaluation Patient Details  Name: Elizabeth Wall MRN: [id number] Date of Birth: Nov 19, 1919  Today's Date: 01/11/2013 Time: 1155-1220 SLP Time Calculation (min): 25 min  Past Medical History:  Past Medical History  Diagnosis Date  . Chronic kidney disease     chronic  . Cardiomyopathy     EF 40%.    Marland Kitchen Dyspepsia   . Peripheral vascular disease   . 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 Thyroid disease     hypothyroidism  . Hypertension   . Renovascular hypertension     s/p stenting by Dr Samule Ohm  . Chronic systolic dysfunction of left ventricle   . Arthritis   . CVA (cerebral vascular accident) 06/2005  . Mitral regurgitation   . Hiatal hernia   . Atrial flutter March 2011    s/p CTI ablation by Dr Ladona Ridgel  . Anemia   . Torn rotator cuff     right   Past Surgical History:  Past Surgical History  Procedure Laterality Date  . Cardiac catheterization    . Cholecystectomy      laporascopic  . Abdominal hysterectomy    . Oophorectomy    . Tonsillectomy    . Ras stents  2005    bilateral  . Sma stenosis      NOv '12 stented  . Eye surgery     HPI:  79 y.o. female admitted with acute respiratory failure, acute on chronic CHF exacerbation.  Today, RN reports persistent cough that gets worse when the patient eats or drinks - suggestive of aspiration. Bedside swallow eval ordered.     Assessment / Plan / Recommendation Clinical Impression  Pt presents with an acute reversible dysphagia secondary to generalized weakness/deconditioning.  Demonstrates overt s/s of aspiration with consumption of thin liquids; symptoms eliminated with thicker viscosity.  Solids swallowed without deficit.  Son and dtr-in-law present for exam.  Discussed transient nature of dysphagia and likely concomitant improvements as overall medical condition improves.  Rec Dysphagia 3, nectar-thick liquids; meds whole  with puree.    Aspiration Risk  Moderate    Diet Recommendation Dysphagia 3 (Mechanical Soft);Nectar-thick liquid   Liquid Administration via: Cup Medication Administration: Whole meds with puree Supervision: Full supervision/cueing for compensatory strategies Compensations: Slow rate;Small sips/bites Postural Changes and/or Swallow Maneuvers: Seated upright 90 degrees    Other  Recommendations Oral Care Recommendations: Oral care BID Other Recommendations: Order thickener from pharmacy   Follow Up Recommendations  None    Frequency and Duration min 2x/week  1 week       SLP Swallow Goals  sEE CARE PLAN   Swallow Study Prior Functional Status       General Date of Onset: 01/09/13 HPI: 79 y.o. female admitted with acute respiratory failure, acute on chronic CHF exacerbation.  Today, RN reports persistent cough that gets worse when the patient eats or drinks - suggestive of aspiration. Bedside swallow eval ordered.   Type of Study: Bedside swallow evaluation Previous Swallow Assessment: none per records Diet Prior to this Study: Regular;Thin liquids Temperature Spikes Noted: Yes Respiratory Status: Nasal cannula Behavior/Cognition: Alert;Cooperative Oral Cavity - Dentition: Adequate natural dentition Self-Feeding Abilities: Able to feed self Patient Positioning: Upright in bed Baseline Vocal Quality: Clear Volitional Cough: Weak Volitional Swallow: Able to elicit    Oral/Motor/Sensory Function Overall Oral Motor/Sensory Function: Appears within functional limits for tasks assessed   Ice Chips Ice chips: Impaired  Presentation: Spoon Pharyngeal Phase Impairments: Throat Clearing - Immediate;Cough - Immediate   Thin Liquid Thin Liquid: Impaired Presentation: Cup;Self Fed Pharyngeal  Phase Impairments: Multiple swallows;Wet Vocal Quality;Cough - Immediate    Nectar Thick Nectar Thick Liquid: Within functional limits Presentation: Cup   Honey Thick Honey Thick Liquid:  Not tested   Puree Puree: Within functional limits   Solid  Elizabeth Wall L. [place], [place] CCC/SLP Pager [phone]     Solid: Not tested       Elizabeth Wall 01/11/2013,12:27 PM

## 2013-01-11 NOTE — Progress Notes (Signed)
OT Cancellation Note  Patient Details Name: Elizabeth Wall MRN: [id number] DOB: March 31, 1919   Cancelled Treatment:    Reason Eval/Treat Not Completed: Patient at procedure or test/ unavailable. Willl re-attempt next date.  01/11/2013 Cipriano Mile OTR/L Pager [phone] Office [phone]

## 2013-01-11 NOTE — Progress Notes (Signed)
Subjective: RN reports persistent cough that gets worse when the patient eats or drinks - suggestive of aspiratin. When given a sip of water she did "strangle" on it. She admits to feeling weak in the legs. She does complain of generalized weakness.  Objective: Lab:  Recent Labs  01/09/13 1314 01/10/13 0530  WBC 7.9 8.0  NEUTROABS 5.9  --   HGB 12.3 10.8*  HCT 35.9* 32.4*  MCV 94.2 95.3  PLT 430* 444*    Recent Labs  01/09/13 1314 01/09/13 1730 01/10/13 0530 01/11/13 0554  NA 129*  --  133* 128*  K 3.4*  --  4.1 3.7  CL 92*  --  97 92*  GLUCOSE 148*  --  130* 110*  BUN 83*  --  81* 76*  CREATININE 2.37*  --  2.15* 1.76*  CALCIUM 11.8*  --  11.7* 11.8*  MG  --  2.0  --   --    Cardiac Panel (last 3 results)  Recent Labs  01/10/13 0905 01/10/13 1858 01/10/13 2339  TROPONINI 0.38* 0.37* 0.30*  max troponin 0.55  Influenza screen negative. Urine neg for Strep pneumo.  Imaging: 12/26 Renal U/S: IMPRESSION:  Multiple bilateral renal cysts. A 2.1 cm lesion in the right kidney  shows numerous internal septations, and has increased in size since  03/13/2009. Consider six-month follow-up ultrasound to assess for  further enlargement. No hydronephrosis.  12/26 2 D echo: Study Conclusions  - Left ventricle: The cavity size was normal. Wall thickness was normal. Systolic function was normal. The estimated ejection fraction was in the range of 60% to 65%. Wall motion was normal; there were no regional wall motion abnormalities. There was an increased relative contribution of atrial contraction to ventricular filling. - Mitral valve: Mild regurgitation. - Right atrium: The atrium was mildly dilated. - Atrial septum: There was an atrial septal aneurysm. - Tricuspid valve: Moderate regurgitation. - Pulmonary arteries: Systolic pressure was mildly increased. PA peak pressure: 43mm Hg (S).  Scheduled Meds: . aspirin  81 mg Oral Daily  . azithromycin  500 mg  Intravenous Q24H  . benzonatate  100 mg Oral TID  . cefTRIAXone (ROCEPHIN)  IV  1 g Intravenous Q24H  . clopidogrel  75 mg Oral Daily  . enoxaparin (LOVENOX) injection  30 mg Subcutaneous Q24H  . febuxostat  80 mg Oral Daily  . folic acid  1 mg Oral Daily  . furosemide  40 mg Intravenous Q12H  . guaiFENesin  1,200 mg Oral BID  . levothyroxine  75 mcg Oral QAC breakfast  . metoprolol  100 mg Oral BID  . pantoprazole  40 mg Oral Q0600  . sodium chloride  3 mL Intravenous Q12H  . sodium chloride  3 mL Intravenous Q12H   Continuous Infusions: . sodium chloride 50 mL/hr at 01/10/13 1055   PRN Meds:.sodium chloride, sodium chloride, acetaminophen, ondansetron (ZOFRAN) IV, sodium chloride, sodium chloride   Physical Exam: Filed Vitals:   01/11/13 0618  BP: 156/71  Pulse: 78  Temp: 98.5 F (36.9 C)  Resp: 22    Intake/Output Summary (Last 24 hours) at 01/11/13 0952 Last data filed at 01/11/13 0400  Gross per 24 hour  Intake    480 ml  Output   1500 ml  Net  -1020 ml  total I/O this adm: - 1,450 Wt Readings from Last 3 Encounters:  01/11/13 111 lb 8.8 oz (50.6 kg)  12/18/12 112 lb 6.4 oz (50.984 kg)  12/16/12 115 lb 12 oz (  52.504 kg)   gen'l - very elderly woman with persistent cough HEENT- [place]/AT, mild temporal wasting Cor - IRIR rate controlled. PUlm - feint crackles at the bases. No increased WOB Neuor - awake and alert.      Assessment/Plan: 1. Acute respiratory failure - resolved  2. Acute on chroinc CHF - 2 D echo noted: suspect the EF 60% is pseudo normal with diastolic dysfunction. She has had a good diuresis Plan Continue q12 lasix  BNP in AM  3. Speech - suspect change is more related to weakness and swallow with no evidence on CT or exam of CNS event Plan  SLP consult  4. ID/Pulm #3 rocephin and azithromycin. Plan F/u CXR  5. PAF - on exam in a. Fib w/ good rate control  6. REnal - creatinine is down. Plan` Continue low flow IVF  7. Hyponatremia  - Na down to 128. No change in therapy   Illene Regulus [place] [facility] (o) [phone]; (c) [phone] Call-grp - Patsi Sears IM  Tele: [phone]  01/11/2013, 9:47 AM

## 2013-01-12 DIAGNOSIS — K55059 Acute (reversible) ischemia of intestine, part and extent unspecified: Secondary | ICD-10-CM

## 2013-01-12 DIAGNOSIS — I5043 Acute on chronic combined systolic (congestive) and diastolic (congestive) heart failure: Secondary | ICD-10-CM

## 2013-01-12 DIAGNOSIS — I4892 Unspecified atrial flutter: Secondary | ICD-10-CM

## 2013-01-12 DIAGNOSIS — I5033 Acute on chronic diastolic (congestive) heart failure: Secondary | ICD-10-CM

## 2013-01-12 LAB — BASIC METABOLIC PANEL
BUN: 72 mg/dL — ABNORMAL HIGH (ref 6–23)
CO2: 22 mEq/L (ref 19–32)
Calcium: 11.8 mg/dL — ABNORMAL HIGH (ref 8.4–10.5)
Chloride: 93 mEq/L — ABNORMAL LOW (ref 96–112)
Creatinine, Ser: 1.6 mg/dL — ABNORMAL HIGH (ref 0.50–1.10)
GFR calc Af Amer: 32 mL/min — ABNORMAL LOW (ref >90)
GFR calc non Af Amer: 27 mL/min — ABNORMAL LOW (ref >90)
Sodium: 129 mEq/L — ABNORMAL LOW (ref 135–145)

## 2013-01-12 LAB — CBC WITH DIFFERENTIAL/PLATELET
Basophils Absolute: 0 10*3/uL (ref 0.0–0.1)
Basophils Relative: 0 % (ref 0–1)
Eosinophils Relative: 0 % (ref 0–5)
Hemoglobin: 12.5 g/dL (ref 12.0–15.0)
MCH: 31.6 pg (ref 26.0–34.0)
MCHC: 33.7 g/dL (ref 30.0–36.0)
Neutro Abs: 8.1 10*3/uL — ABNORMAL HIGH (ref 1.7–7.7)
Neutrophils Relative %: 74 % (ref 43–77)
RDW: 15 % (ref 11.5–15.5)

## 2013-01-12 LAB — PRO B NATRIURETIC PEPTIDE: Pro B Natriuretic peptide (BNP): 34454 pg/mL — ABNORMAL HIGH (ref 0–450)

## 2013-01-12 MED ORDER — FUROSEMIDE 10 MG/ML IJ SOLN
80.0000 mg | Freq: Two times a day (BID) | INTRAMUSCULAR | Status: DC
  Administered 2013-01-12 – 2013-01-15 (×6): 80 mg via INTRAVENOUS
  Filled 2013-01-12 (×7): qty 8

## 2013-01-12 MED ORDER — PIPERACILLIN-TAZOBACTAM IN DEX 2-0.25 GM/50ML IV SOLN
2.2500 g | Freq: Four times a day (QID) | INTRAVENOUS | Status: DC
  Administered 2013-01-12 – 2013-01-15 (×12): 2.25 g via INTRAVENOUS
  Filled 2013-01-12 (×13): qty 50

## 2013-01-12 NOTE — Consult Note (Signed)
Admit date: 01/09/2013 Referring Physician  Dr. Arthur Holms Primary Physician Illene Regulus, MD Primary Cardiologist  Dr. Johney Frame Reason for Consultation  Heart failure  HPI: 79 year old female known to Dr. Johney Frame status post flutter ablation by Dr. Ladona Ridgel with severe peripheral vascular disease followed by Dr. Myra Gianotti with history of mesenteric ischemia as well as cardiomyopathy, ejection fraction 65% from 40% was admitted on 01/09/13 with acute on chronic diastolic heart failure.  She was feeling progressively weaker at home and on Christmas morning could not get up from her table. She was brought to the emergency room for further evaluation. According to her family, a few weeks ago she stopped some of her medications including her metoprolol and at one point, her heart rate was once again in the 130 range. One of her family members, a radiologist, helped her get back on her metoprolol. Currently she is in sinus rhythm. When talking with her, she thinks that her breathing may be slightly improved. She was actually quite witty with me today. However shortly after a few sentences, she nodded off to sleep.  Despite her 2.2 L diuresis, her breathing appeared more labor and cardiology was consulted for further evaluation.  Her creatinine on admission was 2.37 with a BUN of 83 and currently is 1.6 with a BUN of 72. Her sodium appears chronically low however currently is 129 from 133 2 days ago. Troponin was also mildly elevated at 0.5 at peak and is currently 0.3. Her pro BNP was 19,800 on December 26 and is currently 34,400. Chest x-ray on 12/27 appeared to have worsening airspace disease bilaterally, possible worsening pulmonary edema.  She has been treated with azithromycin and ceftriaxone which we currently changed to Zosyn to broaden coverage for possible aspiration with increased leukocytosis.    PMH:   Past Medical History  Diagnosis Date  . Chronic kidney disease     chronic  .  Cardiomyopathy     EF 40%.    Marland Kitchen Dyspepsia   . Peripheral vascular disease   . 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 Thyroid disease     hypothyroidism  . Hypertension   . Renovascular hypertension     s/p stenting by Dr Samule Ohm  . Chronic systolic dysfunction of left ventricle   . Arthritis   . CVA (cerebral vascular accident) 06/2005  . Mitral regurgitation   . Hiatal hernia   . Atrial flutter March 2011    s/p CTI ablation by Dr Ladona Ridgel  . Anemia   . Torn rotator cuff     right    PSH:   Past Surgical History  Procedure Laterality Date  . Cardiac catheterization    . Cholecystectomy      laporascopic  . Abdominal hysterectomy    . Oophorectomy    . Tonsillectomy    . Ras stents  2005    bilateral  . Sma stenosis      NOv '12 stented  . Eye surgery     Allergies:  Amoxicillin-pot clavulanate; Clonidine derivatives; Moxifloxacin; Sulfa antibiotics; Sulfamethoxazole; and Sulfonamide derivatives Prior to Admit Meds:   Prior to Admission medications   Medication Sig Start Date End Date Taking? Authorizing Provider  aspirin 81 MG tablet Take 81 mg by mouth daily.     Yes Historical Provider, MD  Cholecalciferol (VITAMIN D3) 3000 UNITS TABS Take 100 mg by mouth daily.   Yes Historical Provider, MD  clopidogrel (PLAVIX) 75 MG tablet Take 1 tablet (75 mg total) by mouth daily. 07/17/12  Yes Iran Ouch, MD  Febuxostat (ULORIC) 80 MG TABS Take 80 mg by mouth daily.   Yes Historical Provider, MD  folic acid (FOLVITE) 1 MG tablet Take 1 mg by mouth daily.   Yes Historical Provider, MD  furosemide (LASIX) 80 MG tablet Take 80 mg by mouth daily. Take 80mg  in AM and 40mg  in afternoon   Yes Historical Provider, MD  guaiFENesin (MUCINEX) 600 MG 12 hr tablet Take 1,200 mg by mouth 2 (two) times daily.   Yes Historical Provider, MD  levothyroxine (SYNTHROID, LEVOTHROID) 75 MCG tablet Take 75 mcg by mouth daily before  breakfast.   Yes Historical Provider, MD  methotrexate 2.5 MG tablet Take 15 mg by mouth once a week. Takes 6 tablets  weekly   Yes Historical Provider, MD  metoprolol (LOPRESSOR) 100 MG tablet Take 1 tablet (100 mg total) by mouth 2 (two) times daily. 06/14/12  Yes Iran Ouch, MD  Multiple Vitamin (MULTIVITAMIN) capsule Take 1 capsule by mouth daily.    Yes Historical Provider, MD  omeprazole (PRILOSEC) 20 MG capsule Take 20 mg by mouth daily.     Yes Historical Provider, MD   Fam HX:    Family History  Problem Relation Age of Onset  . Heart disease Mother   . Hypertension Father   . Diabetes Brother    Social HX:    History   Social History  . Marital Status: Widowed    Spouse Name: N/A    Number of Children: N/A  . Years of Education: N/A   Occupational History  . Not on file.   Social History Main Topics  . Smoking status: Never Smoker   . Smokeless tobacco: Never Used  . Alcohol Use: No  . Drug Use: No  . Sexual Activity: No   Other Topics Concern  . Not on file   Social History Narrative   lives alone. I-ADL's. married 60 yrs, widowed May 28,'07. 2 miscarriages, 2 sons: '50, '53. 1 daughter: '48. 7 grandchildren. Patient is able to perform all activities of daily living which include dressing, feeding, hygiene, toileting, shopping, and managing her finances without assistance.     ROS:  Denies any strokelike symptoms, bleeding,syncope, chest pain. All 11 ROS were addressed and are negative except what is stated in the HPI   Physical Exam: Blood pressure 131/44, pulse 63, temperature 97.5 F (36.4 C), temperature source Oral, resp. rate 19, height 5\' 1"  (1.549 m), weight 110 lb 14.3 oz (50.3 kg), SpO2 97.00%.   General: Elderly, frail, sleepy in no acute distress Head: Eyes PERRLA, No xanthomas.   Normal cephalic and atramatic  Lungs:   Clear bilaterally to auscultation and percussion. Mildly increased respiratory effort. No wheezes, no rales. Heart:   HRRR  S1 S2 Pulses are diminished distally. Soft systolic murmurapex, rubs, gallops.  No carotid bruit. No JVD.  No abdominal bruits.  Abdomen: Bowel sounds are positive, abdomen soft and non-tender without masses. No hepatosplenomegaly. Msk:  Back normal. Normal strength and tone for age. Extremities:  No clubbing, cyanosis or edema.  DP +1 Neuro: Alert and oriented X 3, non-focal, MAE x 4 GU: Deferred Rectal: Deferred Psych:  Good affect, responds appropriately      Labs: Lab Results  Component Value Date   WBC 11.0* 01/12/2013   HGB 12.5 01/12/2013   HCT 37.1 01/12/2013   MCV 93.7 01/12/2013  PLT 619* 01/12/2013     Recent Labs Lab 01/09/13 1314  01/12/13 0558  NA 129*  < > 129*  K 3.4*  < > 3.4*  CL 92*  < > 93*  CO2 24  < > 22  BUN 83*  < > 72*  CREATININE 2.37*  < > 1.60*  CALCIUM 11.8*  < > 11.8*  PROT 5.7*  --   --   BILITOT 0.3  --   --   ALKPHOS 60  --   --   ALT 10  --   --   AST 26  --   --   GLUCOSE 148*  < > 117*  < > = values in this interval not displayed.  Recent Labs  01/10/13 0530 01/10/13 0905 01/10/13 1858 01/10/13 2339  TROPONINI 0.55* 0.38* 0.37* 0.30*   Lab Results  Component Value Date   CHOL 184 06/26/2011   HDL 37.00* 06/26/2011   LDLCALC 111* 06/26/2011   TRIG 180.0* 06/26/2011   No results found for this basename: DDIMER     Radiology:  Dg Chest 2 View  01/11/2013   CLINICAL DATA:  CHF  EXAM: CHEST  2 VIEW  COMPARISON:  01/09/2013  FINDINGS: Heart is markedly enlarged. Diffuse airspace disease is worse. Pleural effusions are noted. No pneumothorax.  IMPRESSION: Worsening diffuse airspace disease and cardiomegaly. Consider worsening CHF, pneumonia or ARDS.   Electronically Signed   By: Maryclare Bean M.D.   On: 01/11/2013 16:18   US Renal  01/10/2013   CLINICAL DATA:  Hypertension with chronic kidney disease.  EXAM: RENAL/URINARY TRACT ULTRASOUND COMPLETE  COMPARISON:  Abdominal CTA from 03/13/2009  FINDINGS: Right Kidney:  Length: 9.2  cm. Increased echogenicity of the renal parenchyma suggest as a component of underlying medical renal disease. 2.1 x 2.7 cm cystic lesion in the interpolar kidney shows multiple internal septations. This represents the 2.1 x 1.1 cm lesion seen on the previous CT scan. 1.1 x 1.1 cm round cystic lesion identified at the extreme inferior pole of the right kidney. This lesion was 8 x 8 mm on the previous CT scan.  No hydronephrosis.  Left Kidney:  Length: 8.9 cm. Multiple cysts are identified in the left kidney, as seen on the previous CT scan. The largest lesion is 4.3 x 3.0 x 2.4 cm and has simple features by ultrasound. Other smaller cystic lesions towards the lower pole are evident and similar to the previous CT scan.  No hydronephrosis.  Bladder:  Appears normal for degree of bladder distention.  IMPRESSION: Multiple bilateral renal cysts. A 2.1 cm lesion in the right kidney shows numerous internal septations, and has increased in size since 03/13/2009. Consider six-month follow-up ultrasound to assess for further enlargement.   Electronically Signed   By: Kennith Center M.D.   On: 01/10/2013 13:28   Personally viewed.  EKG:  12/26-sinus rhythm rate 73 with no other changes. Personally viewed.   Echocardiogram: 01/10/13: - Left ventricle: The cavity size was normal. Wall thickness was normal. Systolic function was normal. The estimated ejection fraction was in the range of 60% to 65%. Wall motion was normal; there were no regional wall motion abnormalities. There was an increased relative contribution of atrial contraction to ventricular filling. - Mitral valve: Mild regurgitation. - Right atrium: The atrium was mildly dilated. - Atrial septum: There was an atrial septal aneurysm. - Tricuspid valve: Moderate regurgitation. - Pulmonary arteries: Systolic pressure was mildly increased. PA peak pressure: 43mm Hg (S).  ASSESSMENT/PLAN:   79 year old female with acute diastolic heart failure,  ejection fraction 65% up from 40% with chronic kidney disease, worsening respiratory status, elevated BNP despite diuresis.  1. Acute diastolic heart failure-recent echocardiogram on this admission with reassuring ejection fraction of 65%. Diastolic heart failure. Her net output yesterday was 890 mL. I will go ahead and increase her Lasix dose from 40 mg IV twice a day up to 80 mg IV twice a day. Increased interstitial edema on chest x-ray. Increased BNP. Continue with aggressive diuresis. Potassium repletion. Discussed the concept of Starling curve with them. Discussed diastolic dysfunction/stiffness. They understand her overall decreased prognosis. DO NOT RESUSCITATE. Her family states that her birthday is in a few days, [birth year].  If creatinine once again begins to increase, we may need to decrease her aggressive IV Lasix regimen back to by mouth.  2. Elevated troponin-secondary to acute heart failure episode. Not likely thrombotic event. Continue with aspirin, Plavix, beta blocker. Lovenox DVT prophylaxis dosing.  3. Chronic kidney disease stage III-improving creatinine from admission. Pinnae to monitor closely with more aggressive diuresis.  4. Peripheral vascular disease-continue with both Plavix as well as aspirin. No evidence currently of mesenteric ischemia.  5. Hyponatremia-continue to monitor closely. Fluid restriction.   Donato Schultz, MD  01/12/2013  11:18 AM

## 2013-01-12 NOTE — Progress Notes (Signed)
Subjective: SLP consult appreciated.  Mrs. Eichel is very difficult to awaken this AM. While asleep her respirations are a bit labored.  Objective: Lab:  Recent Labs  01/09/13 1314 01/10/13 0530 01/12/13 0558  WBC 7.9 8.0 11.0*  NEUTROABS 5.9  --  8.1*  HGB 12.3 10.8* 12.5  HCT 35.9* 32.4* 37.1  MCV 94.2 95.3 93.7  PLT 430* 444* 619*    Recent Labs  01/09/13 1314 01/09/13 1730 01/10/13 0530 01/11/13 0554 01/12/13 0558  NA 129*  --  133* 128* 129*  K 3.4*  --  4.1 3.7 3.4*  CL 92*  --  97 92* 93*  GLUCOSE 148*  --  130* 110* 117*  BUN 83*  --  81* 76* 72*  CREATININE 2.37*  --  2.15* 1.76* 1.60*  CALCIUM 11.8*  --  11.7* 11.8* 11.8*  MG  --  2.0  --   --   --    pBNP  34454 (14782) Cardiac Panel (last 3 results)  Recent Labs  01/10/13 0905 01/10/13 1858 01/10/13 2339  TROPONINI 0.38* 0.37* 0.30*    Imaging: 01/11/13 CXR 2 view: CHEST 2 VIEW  COMPARISON: 01/09/2013  FINDINGS:  Heart is markedly enlarged. Diffuse airspace disease is worse.  Pleural effusions are noted. No pneumothorax.  IMPRESSION:  Worsening diffuse airspace disease and cardiomegaly. Consider  worsening CHF, pneumonia or ARDS  Scheduled Meds: . aspirin  81 mg Oral Daily  . azithromycin  500 mg Oral Q24H  . benzonatate  100 mg Oral TID  . cefTRIAXone (ROCEPHIN)  IV  1 g Intravenous Q24H  . clopidogrel  75 mg Oral Daily  . enoxaparin (LOVENOX) injection  30 mg Subcutaneous Q24H  . febuxostat  80 mg Oral Daily  . folic acid  1 mg Oral Daily  . furosemide  40 mg Intravenous Q12H  . guaiFENesin  1,200 mg Oral BID  . levothyroxine  75 mcg Oral QAC breakfast  . metoprolol  100 mg Oral BID  . pantoprazole  40 mg Oral Q0600  . sodium chloride  3 mL Intravenous Q12H  . sodium chloride  3 mL Intravenous Q12H   Continuous Infusions: . sodium chloride 50 mL/hr at 01/12/13 0601   PRN Meds:.sodium chloride, sodium chloride, acetaminophen, ondansetron (ZOFRAN) IV, RESOURCE THICKENUP CLEAR,  sodium chloride, sodium chloride   Physical Exam: Filed Vitals:   01/12/13 0620  BP: 131/44  Pulse: 63  Temp: 97.5 F (36.4 C)  Resp: 19  O2 sat - low of 82% x 1 usually 96%  Intake/Output Summary (Last 24 hours) at 01/12/13 0930 Last data filed at 01/12/13 9562  Gross per 24 hour  Intake    480 ml  Output   1250 ml  Net   -770 ml   Total this Adm:  -2220 Gen'l - Very elderly woman who is somnolent but arouses to verbal stimuli with sternal rub Cor 2+ radial, IRIR rate controlled Pulm - mildly labored respirations. Feint rales at left base but otherwise clear Abd - soft Neuro - somnolent, opens eyes, says a few words. By RN report she is more confused.       Assessment/Plan: 2. Acute on chronic diastolic heart failure. Has diuresed -2.2 L byt BNP is up and respirations, despite good breath sounds, are more labored.  Plan Change IVF to [facility]  Cardiology consult -  Suggestions re: medical management. Card on-cal for consults contacted via   3. Speech - appreciate SLP. Agree with diet change.  4. ID/Pulm #  4 rocephin/azithromycin - WBC elevated. Possible aspiration. Plan D/c azithromycin - course complete.  D/C rocephin and start primaxim to broaden coverage  5. PAF - stable rate  6. Renal - improved creatinine but BNP rising Plan Change IVF to [facility] and this may adversely effect renal function  7. Hyponatremia - Na 129  Prognosis appears is poor. Have discussed condition with son Chellsie Gomer who understands her precarious state.    Illene Regulus [place] [facility] (o) [phone]; (c) [phone] Call-grp - Patsi Sears IM  Tele: [phone]  01/12/2013, 9:28 AM

## 2013-01-12 NOTE — Evaluation (Signed)
Occupational Therapy Evaluation Patient Details Name: Elizabeth Wall MRN: [id number] DOB: 1920/01/03 Today's Date: 01/12/2013 Time: 4098-1191 [phone]  (23 minutes total) OT Time Calculation (min): 13 min  OT Assessment / Plan / Recommendation History of present illness Pt admit for respiratory failure.  Pt with possible flu.  Troponins up at present.   Clinical Impression   Pt admitted with above. Pt very lethargic but more alert when sitting EOB. RN present to assist with transfer from bed to chair. Pt requires +2 total assist for bed mobility and OOB transfer.  Pt very quiet with eyes closed for majority of eval.  Will continue to follow acutely in order to address below problem list. Recommend SNF for d/c planning.    OT Assessment  Patient needs continued OT Services    Follow Up Recommendations  Supervision/Assistance - 24 hour;SNF    Barriers to Discharge      Equipment Recommendations   (TBD next venue)    Recommendations for Other Services    Frequency  Min 2X/week    Precautions / Restrictions     Pertinent Vitals/Pain See vitals    ADL  Grooming: Performed;Wash/dry face;Maximal assistance Where Assessed - Grooming: Supine, head of bed up Toilet Transfer: Simulated;+2 Total assistance Toilet Transfer: Patient Percentage: 40% Toilet Transfer Method: Stand pivot Acupuncturist:  (bed>chair) Equipment Used: Gait belt Transfers/Ambulation Related to ADLs: +2 total assist for SPT from bed to chair. Assist with draw pad for power up and to guide hips over to chair.  Pt able to reach for chair and help to pivot feet. ADL Comments: Pt very lethargic supine in bed and difficult to keep awake.  RN arrived and able to provide +2 assist with SPT from bed to chair.  Pt more alert once sitting EOB and able to state that she "feels better" sitting in chair. Pillows positioned under UEs for support in chair.    OT Diagnosis: Generalized weakness;Cognitive  deficits  OT Problem List: Decreased strength;Decreased activity tolerance;Impaired balance (sitting and/or standing);Decreased cognition;Decreased knowledge of use of DME or AE OT Treatment Interventions: Self-care/ADL training;DME and/or AE instruction;Therapeutic activities;Patient/family education;Balance training;Cognitive remediation/compensation   OT Goals(Current goals can be found in the care plan section) Acute Rehab OT Goals Patient Stated Goal: did not state OT Goal Formulation: Patient unable to participate in goal setting Time For Goal Achievement: 2013-02-09 Potential to Achieve Goals: Good  Visit Information  Last OT Received On: 01/12/13 History of Present Illness: Pt admit for respiratory failure.  Pt with possible flu.  Troponins up at present.       Prior Functioning     Home Living Family/patient expects to be discharged to:: Private residence Living Arrangements: Alone Available Help at Discharge: Personal care attendant;Available PRN/intermittently Type of Home: House Home Access: Ramped entrance Home Layout: One level Home Equipment: Walker - 4 wheels;Bedside commode;Tub bench Prior Function Level of Independence: Independent with assistive device(s) Communication Communication: No difficulties         Vision/Perception     Cognition  Cognition Arousal/Alertness: Lethargic Behavior During Therapy: Flat affect Overall Cognitive Status: Impaired/Different from baseline Area of Impairment: Following commands Following Commands: Follows one step commands inconsistently;Follows one step commands with increased time    Extremity/Trunk Assessment Upper Extremity Assessment Upper Extremity Assessment: Generalized weakness     Mobility Bed Mobility Bed Mobility: Supine to Sit;Sitting - Scoot to Edge of Bed Rolling Left: 1: +2 Total assist Rolling Left: Patient Percentage: 0% Left Sidelying to Sit: 1: +1  Total assist Details for Bed Mobility  Assistance: +2 total assist with use of draw pad to guide hips over to EOB. Transfers Transfers: Sit to Stand;Stand to Sit Sit to Stand: 1: +2 Total assist;From bed Sit to Stand: Patient Percentage: 40% Stand to Sit: 1: +2 Total assist;To chair/3-in-1;With armrests;With upper extremity assist Stand to Sit: Patient Percentage: 40% Details for Transfer Assistance: Assist for power up and to provide manual cues through hips for upright posture.       Exercise     Balance Balance Balance Assessed: Yes Static Sitting Balance Static Sitting - Balance Support: Feet supported;No upper extremity supported Static Sitting - Level of Assistance: 3: Mod assist Static Sitting - Comment/# of Minutes: 2 minutes EOB   End of Session OT - End of Session Equipment Utilized During Treatment: Gait belt Activity Tolerance: Patient limited by fatigue Patient left: in chair;with call bell/phone within reach;with nursing/sitter in room;with family/visitor present Nurse Communication: Mobility status  GO    01/12/2013 Cipriano Mile OTR/L Pager [phone] Office [phone]  Cipriano Mile 01/12/2013, 1:23 PM

## 2013-01-12 NOTE — Progress Notes (Signed)
ANTIBIOTIC CONSULT NOTE - INITIAL  Pharmacy Consult for zosyn Indication: empiric coverage  Allergies  Allergen Reactions  . Amoxicillin-Pot Clavulanate Other (See Comments)     unknown  . Amoxicillin-Pot Clavulanate Diarrhea  . Clonidine Derivatives Other (See Comments)    Blood pressure dropped too low  . Moxifloxacin Other (See Comments)    REACTION: patient developed tremors and vibrato  . Sulfa Antibiotics Nausea Only  . Sulfamethoxazole Other (See Comments)    REACTION: unspecified  . Sulfonamide Derivatives Nausea Only    Patient Measurements: Height: 5\' 1"  (154.9 cm) Weight: 110 lb 14.3 oz (50.3 kg) IBW/kg (Calculated) : 47.8   Vital Signs: Temp: 97.5 F (36.4 C) (12/28 0620) Temp src: Oral (12/28 0620) BP: 131/44 mmHg (12/28 0620) Pulse Rate: 63 (12/28 0620) Intake/Output from previous day: 12/27 0701 - 12/28 0700 In: 360 [P.O.:360] Out: 1250 [Urine:1250] Intake/Output from this shift: Total I/O In: 120 [P.O.:120] Out: -   Labs:  Recent Labs  01/09/13 1314 01/09/13 1713 01/10/13 0530 01/11/13 0554 01/12/13 0558  WBC 7.9  --  8.0  --  11.0*  HGB 12.3  --  10.8*  --  12.5  PLT 430*  --  444*  --  619*  LABCREA  --  41.03  --   --   --   CREATININE 2.37*  --  2.15* 1.76* 1.60*   Estimated Creatinine Clearance: 17.6 ml/min (by C-G formula based on Cr of 1.6). No results found for this basename: VANCOTROUGH, VANCOPEAK, VANCORANDOM, GENTTROUGH, GENTPEAK, GENTRANDOM, TOBRATROUGH, TOBRAPEAK, TOBRARND, AMIKACINPEAK, AMIKACINTROU, AMIKACIN,  in the last 72 hours   Microbiology: No results found for this or any previous visit (from the past 720 hour(s)).  Medical History: Past Medical History  Diagnosis Date  . Chronic kidney disease     chronic  . Cardiomyopathy     EF 40%.    Marland Kitchen Dyspepsia   . Peripheral vascular disease   . 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 Thyroid disease     hypothyroidism  . Hypertension   . Renovascular hypertension     s/p stenting by Dr Samule Ohm  . Chronic systolic dysfunction of left ventricle   . Arthritis   . CVA (cerebral vascular accident) 06/2005  . Mitral regurgitation   . Hiatal hernia   . Atrial flutter March 2011    s/p CTI ablation by Dr Ladona Ridgel  . Anemia   . Torn rotator cuff     right    Assessment: Patient is a 79 y.o F on rocephin and azithromycin for suspected PNA.  Patient remains afebrile but wbc increased to 11 this morning and chest X-ray on 12/27 with "worsening diffuse airspace disease ".  To change abx to zosyn for broader coverage.  Patient has renal insufficiency with scr 1.60 (est crcl~18). Patient has noted PCN allergy but reaction is diarrhea.  Plan:  1) zosyn 2.25gm IV q6h  Keajah Killough P 01/12/2013,10:06 AM

## 2013-01-13 ENCOUNTER — Inpatient Hospital Stay (HOSPITAL_COMMUNITY): Payer: Medicare Other

## 2013-01-13 DIAGNOSIS — R7989 Other specified abnormal findings of blood chemistry: Secondary | ICD-10-CM

## 2013-01-13 DIAGNOSIS — N189 Chronic kidney disease, unspecified: Secondary | ICD-10-CM

## 2013-01-13 DIAGNOSIS — I4891 Unspecified atrial fibrillation: Secondary | ICD-10-CM

## 2013-01-13 DIAGNOSIS — I5031 Acute diastolic (congestive) heart failure: Secondary | ICD-10-CM

## 2013-01-13 LAB — CBC WITH DIFFERENTIAL/PLATELET
Basophils Absolute: 0 10*3/uL (ref 0.0–0.1)
Eosinophils Relative: 1 % (ref 0–5)
HCT: 33.2 % — ABNORMAL LOW (ref 36.0–46.0)
Hemoglobin: 11.4 g/dL — ABNORMAL LOW (ref 12.0–15.0)
Lymphocytes Relative: 19 % (ref 12–46)
MCHC: 34.3 g/dL (ref 30.0–36.0)
MCV: 93.5 fL (ref 78.0–100.0)
Monocytes Absolute: 0.6 10*3/uL (ref 0.1–1.0)
Monocytes Relative: 6 % (ref 3–12)
Neutrophils Relative %: 74 % (ref 43–77)
RBC: 3.55 MIL/uL — ABNORMAL LOW (ref 3.87–5.11)
RDW: 15.2 % (ref 11.5–15.5)
WBC: 9.1 10*3/uL (ref 4.0–10.5)

## 2013-01-13 LAB — BASIC METABOLIC PANEL
BUN: 72 mg/dL — ABNORMAL HIGH (ref 6–23)
CO2: 27 mEq/L (ref 19–32)
Chloride: 97 mEq/L (ref 96–112)
Creatinine, Ser: 1.79 mg/dL — ABNORMAL HIGH (ref 0.50–1.10)
GFR calc Af Amer: 28 mL/min — ABNORMAL LOW (ref >90)
Sodium: 136 mEq/L (ref 135–145)

## 2013-01-13 NOTE — Care Management Note (Addendum)
  Page 1 of 1   01/17/2013     3:40:42 PM   CARE MANAGEMENT NOTE 01/17/2013  Patient:  Elizabeth Wall, Elizabeth Wall   Account Number:  [id number]  Date Initiated:  01/13/2013  Documentation initiated by:  Donato Schultz  Subjective/Objective Assessment:   Admitted acute respiratory distress     Action/Plan:   CM will monitor for disposition needs   Anticipated DC Date:  01/16/2013   Anticipated DC Plan:  SKILLED NURSING FACILITY  In-house referral  Clinical Social Worker      DC Planning Services  CM consult      Choice offered to / List presented to:             Status of service:  Completed, signed off Medicare Important Message given?   (If response is "NO", the following Medicare IM given date fields will be blank) Date Medicare IM given:   Date Additional Medicare IM given:    Discharge Disposition:    Per UR Regulation:  Reviewed for med. necessity/level of care/duration of stay  If discussed at Long Length of Stay Meetings, dates discussed:    Comments:  01/17/2013 Dispositon:  IP Hospice- [facility]. (See SW note) Wenonah Milo RN, BSN, MSHL, CCM 01/17/2013   PT recs:  SNF; Supervision/Assistance - 24 hour DME:  None recommended by PT Dispositon:  plan pending Kamaree Wheatley RN, BSN, MSHL, CCM 01/13/2013

## 2013-01-13 NOTE — Progress Notes (Signed)
SUBJECTIVE: The patient is slightly confused this morning.  She states that her shortness of breath is improved.  CURRENT MEDICATIONS: . aspirin  81 mg Oral Daily  . benzonatate  100 mg Oral TID  . clopidogrel  75 mg Oral Daily  . enoxaparin (LOVENOX) injection  30 mg Subcutaneous Q24H  . febuxostat  80 mg Oral Daily  . folic acid  1 mg Oral Daily  . furosemide  80 mg Intravenous Q12H  . guaiFENesin  1,200 mg Oral BID  . levothyroxine  75 mcg Oral QAC breakfast  . metoprolol  100 mg Oral BID  . pantoprazole  40 mg Oral Q0600  . piperacillin-tazobactam (ZOSYN)  IV  2.25 g Intravenous Q6H  . sodium chloride  3 mL Intravenous Q12H  . sodium chloride  3 mL Intravenous Q12H      OBJECTIVE: Physical Exam: Filed Vitals:   01/12/13 1436 01/12/13 2135 01/13/13 0700 01/13/13 0739  BP: 117/39 139/47 146/55   Pulse: 61 68 79   Temp: 97.9 F (36.6 C) 97.7 F (36.5 C) 97 F (36.1 C)   TempSrc: Axillary Oral Axillary   Resp: 16 18 17    Height:      Weight:   107 lb 9.4 oz (48.8 kg)   SpO2: 98% 97% 85% 96%    Intake/Output Summary (Last 24 hours) at 01/13/13 1036 Last data filed at 01/13/13 0858  Gross per 24 hour  Intake    240 ml  Output    500 ml  Net   -260 ml    Telemetry - non tele  GEN- The patient is chronically ill appearing, alert but confused Head- normocephalic, atraumatic Eyes-  Sclera clear, conjunctiva pink Ears- hearing intact Oropharynx- clear Neck- supple,  Lungs- tachypneic, short shallow breaths, + rales Heart- Regular rate and rhythm, decreased HS GI- soft, NT, ND, + BS Extremities- no clubbing, cyanosis, or edema Frail and thin  LABS: Basic Metabolic Panel:  Recent Labs  40/10/27 0558 01/13/13 0915  NA 129* 136  K 3.4* 3.0*  CL 93* 97  CO2 22 27  GLUCOSE 117* 113*  BUN 72* 72*  CREATININE 1.60* 1.79*  CALCIUM 11.8* 12.4*   CBC:  Recent Labs  01/12/13 0558 01/13/13 0915  WBC 11.0* 9.1  NEUTROABS 8.1* 6.7  HGB 12.5 11.4*    HCT 37.1 33.2*  MCV 93.7 93.5  PLT 619* 538*   Cardiac Enzymes:  Recent Labs  01/10/13 1858 01/10/13 2339  TROPONINI 0.37* 0.30*    RADIOLOGY: Dg Chest 2 View 01/13/2013   CLINICAL DATA:  Congestive heart failure.  Pneumonia.  EXAM: CHEST  2 VIEW  COMPARISON:  01/11/2013  FINDINGS: Bilateral airspace opacities appear mildly worsened. Peripheral interstitial accentuation suggests at least a component of congestive heart failure. Cardiomegaly is present. Atherosclerotic aortic arch. Thoracic spondylosis.  Severe right glenohumeral arthropathy. Small bilateral pleural effusions.  IMPRESSION: 1. Mildly worsened bilateral airspace opacities. The cardiomegaly and peripheral interstitial accentuation suggests that some or all of the airspace opacities are due to congestive heart failure, although superimposed pneumonia is difficult to exclude. 2. Stable right glenohumeral arthropathy. 3. Small bilateral pleural effusions.   Electronically Signed   By: Herbie Baltimore M.D.   On: 01/13/2013 08:14   ASSESSMENT AND PLAN:  Principal Problem:   Respiratory failure Active Problems:   HYPOTHYROIDISM   ANXIETY   HYPERTENSION   CORONARY ARTERY DISEASE   CAROTID ARTERY STENOSIS, WITHOUT INFARCTION   GASTROESOPHAGEAL REFLUX DISEASE   CHRONIC KIDNEY  DISEASE UNSPECIFIED   Atrial flutter   Iron deficiency anemia   Hyperlipidemia   Mesenteric artery insufficiency   Acute systolic CHF (congestive heart failure)   CAP (community acquired pneumonia): probable   Slurred speech   Acute on chronic renal failure   Acute respiratory distress   Hyponatremia   ASSESSMENT/PLAN:  79 year old female with acute diastolic heart failure, ejection fraction 65% up from 40% with chronic kidney disease, worsening respiratory status, elevated BNP despite diuresis.   1. Acute diastolic heart failure-recent echocardiogram on this admission with reassuring ejection fraction of 65%.  Continue current lasix for another  day and follow creatinine.  May need to decrease dose tomorrow depending on renal function.  2. Elevated troponin-secondary to acute heart failure episode. Not likely thrombotic event. Continue with aspirin, Plavix, beta blocker. Lovenox DVT prophylaxis dosing.   3. Chronic kidney disease stage III- monitor closely with more aggressive diuresis.   4. Peripheral vascular disease-continue with both Plavix as well as aspirin. No evidence currently of mesenteric ischemia.   5. Hyponatremia-continue to monitor closely. Fluid restriction.  6. afib Rate controlled Poor candidate for triple anticoagulation  Prognosis is poor.  Palliative measures would seem appropriate.

## 2013-01-13 NOTE — Progress Notes (Signed)
Physical Therapy Treatment Patient Details Name: Elizabeth Wall MRN: [id number] DOB: 07-Jul-1919 Today's Date: 01/13/2013 Time: 9528-4132 PT Time Calculation (min): 29 min  PT Assessment / Plan / Recommendation  History of Present Illness Pt admit for respiratory failure.  Pt with possible flu.  Troponins up at present.   PT Comments   Pt cont's to be very lethargic & confused today as she kept her eyes closed majority of session however she would respond to questions but not always appropriately.     Follow Up Recommendations  SNF;Supervision/Assistance - 24 hour     Does the patient have the potential to tolerate intense rehabilitation     Barriers to Discharge        Equipment Recommendations  None recommended by PT    Recommendations for Other Services    Frequency Min 3X/week   Progress towards PT Goals    Plan Current plan remains appropriate    Precautions / Restrictions Precautions Precautions: Fall Restrictions Weight Bearing Restrictions: No   Pertinent Vitals/Pain No pain indicated.     Mobility  Bed Mobility Bed Mobility: Supine to Sit;Sitting - Scoot to Edge of Bed Supine to Sit: 1: +2 Total assist;With rails Supine to Sit: Patient Percentage: 10% Sitting - Scoot to Edge of Bed: 1: +1 Total assist Details for Bed Mobility Assistance: Max directional cues.  Pt initiated LE movevement to EOB but required total (A) to complete & for all other transitional movements.   Transfers Transfers: Sit to Stand;Stand to Sit;Stand Pivot Transfers Sit to Stand: 1: +2 Total assist;With upper extremity assist;From bed Sit to Stand: Patient Percentage: 40% Stand to Sit: 1: +2 Total assist;With armrests;With upper extremity assist;To chair/3-in-1 Stand to Sit: Patient Percentage: 50% Stand Pivot Transfers: 1: +2 Total assist Stand Pivot Transfers: Patient Percentage: 50% Details for Transfer Assistance: (A) to achieve standing, balance, rotation of hips from  bed>recliner, & controlled descent.  Pt had difficulty stepping with LLE but she was able to appropriately reach for armrests of chair as she was pivoting hips.   Ambulation/Gait Ambulation/Gait Assistance: Not tested (comment)      PT Goals (current goals can now be found in the care plan section) Acute Rehab PT Goals Patient Stated Goal: did not state PT Goal Formulation: With patient Time For Goal Achievement: 01/17/13 Potential to Achieve Goals: Good  Visit Information  Last PT Received On: 01/13/13 Assistance Needed: +2 History of Present Illness: Pt admit for respiratory failure.  Pt with possible flu.  Troponins up at present.    Subjective Data  Patient Stated Goal: did not state   Cognition  Cognition Arousal/Alertness: Lethargic Behavior During Therapy: Flat affect Overall Cognitive Status: Impaired/Different from baseline Area of Impairment: Following commands;Awareness;Orientation;Attention Orientation Level: Disoriented to;Place;Time;Situation Current Attention Level: Focused Following Commands: Follows one step commands inconsistently    Balance  Static Sitting Balance Static Sitting - Balance Support: Feet supported Static Sitting - Level of Assistance: 5: Stand by assistance Static Sitting - Comment/# of Minutes: sat EOB x 5 mins without UE support while tech was preparing for transfer.    End of Session PT - End of Session Equipment Utilized During Treatment: Oxygen Activity Tolerance: Patient limited by lethargy Patient left: in chair;with call bell/phone within reach;with family/visitor present Nurse Communication: Mobility status   GP     Lara Mulch 01/13/2013, 12:52 PM  Verdell Face, PTA [phone] 01/13/2013

## 2013-01-13 NOTE — Progress Notes (Signed)
UR completed Ismael Treptow K. Paulanthony Gleaves, RN, BSN, MSHL, CCM  01/13/2013 4:34 PM

## 2013-01-13 NOTE — Progress Notes (Signed)
Subjective: Appreciate both pharmacy's assistance and cardiology consult.  Reviewed in detail. \  Mrs. Bar is awake, perhaps a little confused but definitely has poor insight into her condition. She is not in distress but continues to have a persistent cough - upper airway without sputum production.  Objective: Lab:  Recent Labs  01/12/13 0558  WBC 11.0*  NEUTROABS 8.1*  HGB 12.5  HCT 37.1  MCV 93.7  PLT 619*    Recent Labs  01/11/13 0554 01/12/13 0558  NA 128* 129*  K 3.7 3.4*  CL 92* 93*  GLUCOSE 110* 117*  BUN 76* 72*  CREATININE 1.76* 1.60*  CALCIUM 11.8* 11.8*    Imaging:  Scheduled Meds: . aspirin  81 mg Oral Daily  . benzonatate  100 mg Oral TID  . clopidogrel  75 mg Oral Daily  . enoxaparin (LOVENOX) injection  30 mg Subcutaneous Q24H  . febuxostat  80 mg Oral Daily  . folic acid  1 mg Oral Daily  . furosemide  80 mg Intravenous Q12H  . guaiFENesin  1,200 mg Oral BID  . levothyroxine  75 mcg Oral QAC breakfast  . metoprolol  100 mg Oral BID  . pantoprazole  40 mg Oral Q0600  . piperacillin-tazobactam (ZOSYN)  IV  2.25 g Intravenous Q6H  . sodium chloride  3 mL Intravenous Q12H  . sodium chloride  3 mL Intravenous Q12H   Continuous Infusions:  PRN Meds:.sodium chloride, sodium chloride, acetaminophen, ondansetron (ZOFRAN) IV, RESOURCE THICKENUP CLEAR, sodium chloride, sodium chloride   Physical Exam: Filed Vitals:   01/12/13 2135  BP: 139/47  Pulse: 68  Temp: 97.7 F (36.5 C)  Resp: 18    Intake/Output Summary (Last 24 hours) at 01/13/13 0656 Last data filed at 01/13/13 0131  Gross per 24 hour  Intake    240 ml  Output    500 ml  Net   -260 ml   Total this adm: - 2,600 Gen'l- very frail and elderly woman in no acute distress HEENT - dry mm Cor - 2+ radial, IRIR rate controlled. Pulm - rales at the left base, shallow inspirations but no increased WOB Abd - soft Derm - very large bruise on the left hip Neuro - awake. Knows examiner.  Speech is clear.     Assessment/Plan: 2. Heart failure - 2.6 liters off today.  Wt Readings from Last 3 Encounters:  01/13/13 107 lb 9.4 oz (48.8 kg)  12/18/12 112 lb 6.4 oz (50.984 kg)  12/16/12 115 lb 12 oz (52.504 kg)   Plan Continue higher dose lasix as recommended  F/u CXR 2 view today  Bmet  4. ID/pulm - #4/4 rocephin/Azithromycin, #1 Zosyn. No fever, no productive cough Plan Continue antibiotics  F/u 2 view CXR  CBCD this AM  5. PAF - stable rate this AM  6. Renal - on higher dose lasix. Plan F/u Bmet   [facility] [employer] [facility] (o) [phone]; (c) [phone] Call-grp - [facility]  Tele: [phone]  01/13/2013, 6:56 AM

## 2013-01-14 MED ORDER — METOLAZONE 2.5 MG PO TABS
2.5000 mg | ORAL_TABLET | Freq: Every day | ORAL | Status: DC
  Administered 2013-01-14 – 2013-01-17 (×4): 2.5 mg via ORAL
  Filled 2013-01-14 (×5): qty 1

## 2013-01-14 NOTE — Progress Notes (Signed)
Subjective: Elizabeth Wall is a bit confused: not really aware that she has been in the hospital for several days and doesn't know why she is here. She does know that [birth year] birthday. She c/o weakness in the arms but has no other complaints  Objective: Lab:  Recent Labs  01/12/13 0558 01/13/13 0915  WBC 11.0* 9.1  NEUTROABS 8.1* 6.7  HGB 12.5 11.4*  HCT 37.1 33.2*  MCV 93.7 93.5  PLT 619* 538*    Recent Labs  01/12/13 0558 01/13/13 0915  NA 129* 136  K 3.4* 3.0*  CL 93* 97  GLUCOSE 117* 113*  BUN 72* 72*  CREATININE 1.60* 1.79*  CALCIUM 11.8* 12.4*    Imaging: 01/13/13 CXR: IMPRESSION:  1. Mildly worsened bilateral airspace opacities. The cardiomegaly  and peripheral interstitial accentuation suggests that some or all  of the airspace opacities are due to congestive heart failure,  although superimposed pneumonia is difficult to exclude.  2. Stable right glenohumeral arthropathy.  3. Small bilateral pleural effusions.  Scheduled Meds: . aspirin  81 mg Oral Daily  . benzonatate  100 mg Oral TID  . clopidogrel  75 mg Oral Daily  . enoxaparin (LOVENOX) injection  30 mg Subcutaneous Q24H  . febuxostat  80 mg Oral Daily  . folic acid  1 mg Oral Daily  . furosemide  80 mg Intravenous Q12H  . guaiFENesin  1,200 mg Oral BID  . levothyroxine  75 mcg Oral QAC breakfast  . metoprolol  100 mg Oral BID  . pantoprazole  40 mg Oral Q0600  . piperacillin-tazobactam (ZOSYN)  IV  2.25 g Intravenous Q6H  . sodium chloride  3 mL Intravenous Q12H  . sodium chloride  3 mL Intravenous Q12H   Continuous Infusions:  PRN Meds:.sodium chloride, sodium chloride, acetaminophen, ondansetron (ZOFRAN) IV, RESOURCE THICKENUP CLEAR, sodium chloride, sodium chloride   Physical Exam: Filed Vitals:   01/13/13 2041  BP: 135/41  Pulse: 55  Temp: 97.2 F (36.2 C)  Resp: 18    Intake/Output Summary (Last 24 hours) at 01/14/13 0717 Last data filed at 01/13/13 2146  Gross per 24 hour   Intake    360 ml  Output    652 ml  Net   -292 ml   Total this adm: -2,892  Wt Readings from Last 3 Encounters:  01/13/13 107 lb 9.4 oz (48.8 kg)  12/18/12 112 lb 6.4 oz (50.984 kg)  12/16/12 115 lb 12 oz (52.504 kg)   Gen'l- elderly and frail woman HEENT- dry mm Cor 2+ radial, regular rate with PVCs associated with cough. Rate is controlled Pulm - bibasilar rales, worse than yesterday Abd - soft Neuro - awake, speech is clear, she is confused.         Assessment/Plan: 2. Heart failure - has diuresed almost 3 liters but sounds worse and x-ray was worse. Cardiology help appreciated. Plan Will add zaroxyln to increase diuresis.  3. Speech - on thickened liquids  4. ID/Pulm - 4/4 rocephin/azithromycin. #2 Zosyn. No fever, no sputum produciton. CXR favors pul edema Plan  continue zosyn   5. PAF - in regular rhythm with frequent PVCs this AM  6. Renal - slight bump in creatinine with continue diuresis  Prognosis - very poor. May be at end stage heat failure. Spoke with son Jesusita Oka - he understands the situation. He will be giving notice to close friends and family that they should come to see her now.      Illene Regulus  [facility] [place] [facility] (o) [phone]; (c) [phone] Call-grp - [facility]  Tele: [phone]  01/14/2013, 7:17 AM

## 2013-01-14 NOTE — Progress Notes (Addendum)
Speech Language Pathology Treatment: Dysphagia  Patient Details Name: Elizabeth Wall MRN: [id number] DOB: [birth year] Today's Date: 01/14/2013 Time: 1015-1030 SLP Time Calculation (min): 15 min  Assessment / Plan / Recommendation Clinical Impression  F/u from BSE for diet tolerance of dysphagia 3 and nectar thick liquids.  No trial PO 's administered secondary to patient presenting with lethargy with inability to participate fully.   Per nursing patient with decreased PO intake.  Caretaker, present in room during treatment, states patient unable to "chew" even soft solids secondary to weakness and will "eat only small bites of magic cup".  Due to patient presenting with weakness and decreased reserve, in comparison to initial BSE, recommend to downgrade to dysphagia 1 (puree) and continue nectar thick liquids.  Requires continued total assist with all PO's.  Recommend to administer PO's when only fully alert and fully accepting as aspiration risk remains.  Oral mucosa noted to be dry recommend oral care with use of oral suction for comfort QID.  ST to continue in Acute Care briefly for POC and to provide education to caregivers on strategies to maximize safety of the swallow.  Confirmed with RN with diet downgrade and prior dietary restrictions.    HPI HPI: Elizabeth Wall is a 79 y.o. female hx of CKD, PVD, aflutter here with weakness. Diffuse weakness and decreased PO intake for several days. No vomiting or abdominal pain. She also has shortness of breath and dry cough for several days. Denies fevers or chest pain. She came by EMS. As per EMS O2 was 91% on arrival and placed on O2, nl CBG.       SLP Plan  Continue with current plan of care    Recommendations Diet recommendations: Dysphagia 1 (puree);Nectar-thick liquid Medication Administration: Crushed with puree Supervision: Full supervision/cueing for compensatory strategies Compensations: Slow rate;Small sips/bites Postural Changes  and/or Swallow Maneuvers: Seated upright 90 degrees              Oral Care Recommendations: Oral care Q4 per protocol Plan: Continue with current plan of care    GO    Moreen Fowler MS, CCC-SLP [phone] [facility] 01/14/2013, 10:42 AM

## 2013-01-14 NOTE — Progress Notes (Signed)
SUBJECTIVE: The patient is slightly confused this morning.  She is sleeping but arouses.  Her son is at the bedside.  CURRENT MEDICATIONS: . aspirin  81 mg Oral Daily  . benzonatate  100 mg Oral TID  . clopidogrel  75 mg Oral Daily  . enoxaparin (LOVENOX) injection  30 mg Subcutaneous Q24H  . febuxostat  80 mg Oral Daily  . folic acid  1 mg Oral Daily  . furosemide  80 mg Intravenous Q12H  . guaiFENesin  1,200 mg Oral BID  . levothyroxine  75 mcg Oral QAC breakfast  . metolazone  2.5 mg Oral Daily  . metoprolol  100 mg Oral BID  . pantoprazole  40 mg Oral Q0600  . piperacillin-tazobactam (ZOSYN)  IV  2.25 g Intravenous Q6H  . sodium chloride  3 mL Intravenous Q12H  . sodium chloride  3 mL Intravenous Q12H      OBJECTIVE: Physical Exam: Filed Vitals:   01/13/13 0739 01/13/13 1046 01/13/13 1606 01/13/13 2041  BP:  134/62 150/64 135/41  Pulse:  71 74 55  Temp:   97.9 F (36.6 C) 97.2 F (36.2 C)  TempSrc:   Oral Oral  Resp:   18 18  Height:      Weight:      SpO2: 96%  96% 100%    Intake/Output Summary (Last 24 hours) at 01/14/13 0957 Last data filed at 01/14/13 9562  Gross per 24 hour  Intake    360 ml  Output    652 ml  Net   -292 ml    Telemetry - non tele  GEN- The patient is chronically ill appearing, alert but confused Head- normocephalic, atraumatic Eyes-  Sclera clear, conjunctiva pink Ears- hearing intact Oropharynx- clear Neck- supple,  Lungs- tachypneic, short shallow breaths, + rales Heart- Regular rate and rhythm, decreased HS GI- soft, NT, ND, + BS Extremities- no clubbing, cyanosis, or edema Frail and thin  LABS: Basic Metabolic Panel:  Recent Labs  13/08/65 0558 01/13/13 0915  NA 129* 136  K 3.4* 3.0*  CL 93* 97  CO2 22 27  GLUCOSE 117* 113*  BUN 72* 72*  CREATININE 1.60* 1.79*  CALCIUM 11.8* 12.4*   CBC:  Recent Labs  01/12/13 0558 01/13/13 0915  WBC 11.0* 9.1  NEUTROABS 8.1* 6.7  HGB 12.5 11.4*  HCT 37.1 33.2*    MCV 93.7 93.5  PLT 619* 538*   Cardiac Enzymes: No results found for this basename: CKTOTAL, CKMB, CKMBINDEX, TROPONINI,  in the last 72 hours  RADIOLOGY: Dg Chest 2 View 01/13/2013   CLINICAL DATA:  Congestive heart failure.  Pneumonia.  EXAM: CHEST  2 VIEW  COMPARISON:  01/11/2013  FINDINGS: Bilateral airspace opacities appear mildly worsened. Peripheral interstitial accentuation suggests at least a component of congestive heart failure. Cardiomegaly is present. Atherosclerotic aortic arch. Thoracic spondylosis.  Severe right glenohumeral arthropathy. Small bilateral pleural effusions.  IMPRESSION: 1. Mildly worsened bilateral airspace opacities. The cardiomegaly and peripheral interstitial accentuation suggests that some or all of the airspace opacities are due to congestive heart failure, although superimposed pneumonia is difficult to exclude. 2. Stable right glenohumeral arthropathy. 3. Small bilateral pleural effusions.   Electronically Signed   By: Herbie Baltimore M.D.   On: 01/13/2013 08:14   ASSESSMENT AND PLAN:  Principal Problem:   Respiratory failure Active Problems:   HYPOTHYROIDISM   ANXIETY   HYPERTENSION   CORONARY ARTERY DISEASE   CAROTID ARTERY STENOSIS, WITHOUT INFARCTION   GASTROESOPHAGEAL REFLUX  DISEASE   CHRONIC KIDNEY DISEASE UNSPECIFIED   Atrial flutter   Iron deficiency anemia   Hyperlipidemia   Mesenteric artery insufficiency   Acute systolic CHF (congestive heart failure)   CAP (community acquired pneumonia): probable   Slurred speech   Acute on chronic renal failure   Acute respiratory distress   Hyponatremia   ASSESSMENT/PLAN:  79 year old female with acute diastolic heart failure, ejection fraction 65% up from 40% with chronic kidney disease, worsening respiratory status, elevated BNP despite diuresis.   1. Acute diastolic heart failure- Continue current lasix and follow creatinine.    2. Elevated troponin-secondary to acute heart failure  episode. Not likely thrombotic event. Continue with aspirin, Plavix, beta blocker. Lovenox DVT prophylaxis dosing.   3. Chronic kidney disease stage III- monitor closely with more aggressive diuresis.   4. Peripheral vascular disease-continue with both Plavix as well as aspirin. No evidence currently of mesenteric ischemia.   5. Hyponatremia-continue to monitor closely. Fluid restriction.  6. afib Rate controlled Poor candidate for triple anticoagulation  Prognosis is poor.  Palliative measures would seem appropriate.  Family (son) is at bedside and is aware.

## 2013-01-15 DIAGNOSIS — E039 Hypothyroidism, unspecified: Secondary | ICD-10-CM

## 2013-01-15 DIAGNOSIS — E875 Hyperkalemia: Secondary | ICD-10-CM

## 2013-01-15 LAB — BASIC METABOLIC PANEL
BUN: 71 mg/dL — ABNORMAL HIGH (ref 6–23)
CO2: 31 mEq/L (ref 19–32)
Calcium: 12.8 mg/dL — ABNORMAL HIGH (ref 8.4–10.5)
Creatinine, Ser: 2.06 mg/dL — ABNORMAL HIGH (ref 0.50–1.10)
GFR calc non Af Amer: 20 mL/min — ABNORMAL LOW (ref >90)
Glucose, Bld: 98 mg/dL (ref 70–99)

## 2013-01-15 MED ORDER — POTASSIUM CHLORIDE CRYS ER 20 MEQ PO TBCR
20.0000 meq | EXTENDED_RELEASE_TABLET | Freq: Three times a day (TID) | ORAL | Status: AC
  Administered 2013-01-15 (×3): 20 meq via ORAL
  Filled 2013-01-15: qty 1

## 2013-01-15 MED ORDER — FUROSEMIDE 40 MG PO TABS
40.0000 mg | ORAL_TABLET | Freq: Two times a day (BID) | ORAL | Status: DC
  Administered 2013-01-15 – 2013-01-17 (×5): 40 mg via ORAL
  Filled 2013-01-15 (×7): qty 1

## 2013-01-15 NOTE — Progress Notes (Signed)
SUBJECTIVE: The patient sleeping but arousable.  She denies any pain this morning.   Weight stable, creatinine increased this morning, K 3.1  CURRENT MEDICATIONS: . aspirin  81 mg Oral Daily  . benzonatate  100 mg Oral TID  . clopidogrel  75 mg Oral Daily  . enoxaparin (LOVENOX) injection  30 mg Subcutaneous Q24H  . febuxostat  80 mg Oral Daily  . folic acid  1 mg Oral Daily  . furosemide  80 mg Intravenous Q12H  . guaiFENesin  1,200 mg Oral BID  . levothyroxine  75 mcg Oral QAC breakfast  . metolazone  2.5 mg Oral Daily  . metoprolol  100 mg Oral BID  . pantoprazole  40 mg Oral Q0600  . piperacillin-tazobactam (ZOSYN)  IV  2.25 g Intravenous Q6H  . sodium chloride  3 mL Intravenous Q12H  . sodium chloride  3 mL Intravenous Q12H      OBJECTIVE: Physical Exam: Filed Vitals:   01/14/13 1500 01/14/13 1551 01/14/13 2146 01/14/13 2230  BP:  129/59 174/46 142/52  Pulse:  52 64   Temp:  97.3 F (36.3 C) 97.4 F (36.3 C)   TempSrc:  Axillary Oral   Resp:  18 18   Height:      Weight: 107 lb 9.4 oz (48.8 kg)     SpO2:  100% 100%     Intake/Output Summary (Last 24 hours) at 01/15/13 1610 Last data filed at 01/14/13 1832  Gross per 24 hour  Intake    360 ml  Output    300 ml  Net     60 ml    Telemetry - non tele  GEN- The patient is chronically ill appearing, alert but confused Head- normocephalic, atraumatic Eyes-  Sclera clear, conjunctiva pink Ears- hearing intact Oropharynx- clear Neck- supple,  Lungs- tachypneic, short shallow breaths, + rales Heart- Regular rate and rhythm, decreased HS GI- soft, NT, ND, + BS Extremities- no clubbing, cyanosis, or edema Frail and thin  LABS: Basic Metabolic Panel:  Recent Labs  96/04/54 0915  NA 136  K 3.0*  CL 97  CO2 27  GLUCOSE 113*  BUN 72*  CREATININE 1.79*  CALCIUM 12.4*   CBC:  Recent Labs  01/13/13 0915  WBC 9.1  NEUTROABS 6.7  HGB 11.4*  HCT 33.2*  MCV 93.5  PLT 538*    RADIOLOGY: Dg  Chest 2 View 01/13/2013   CLINICAL DATA:  Congestive heart failure.  Pneumonia.  EXAM: CHEST  2 VIEW  COMPARISON:  01/11/2013  FINDINGS: Bilateral airspace opacities appear mildly worsened. Peripheral interstitial accentuation suggests at least a component of congestive heart failure. Cardiomegaly is present. Atherosclerotic aortic arch. Thoracic spondylosis.  Severe right glenohumeral arthropathy. Small bilateral pleural effusions.  IMPRESSION: 1. Mildly worsened bilateral airspace opacities. The cardiomegaly and peripheral interstitial accentuation suggests that some or all of the airspace opacities are due to congestive heart failure, although superimposed pneumonia is difficult to exclude. 2. Stable right glenohumeral arthropathy. 3. Small bilateral pleural effusions.   Electronically Signed   By: Herbie Baltimore M.D.   On: 01/13/2013 08:14   ASSESSMENT AND PLAN:  Principal Problem:   Respiratory failure Active Problems:   HYPOTHYROIDISM   ANXIETY   HYPERTENSION   CORONARY ARTERY DISEASE   CAROTID ARTERY STENOSIS, WITHOUT INFARCTION   GASTROESOPHAGEAL REFLUX DISEASE   CHRONIC KIDNEY DISEASE UNSPECIFIED   Atrial flutter   Iron deficiency anemia   Hyperlipidemia   Mesenteric artery insufficiency   Acute systolic  CHF (congestive heart failure)   CAP (community acquired pneumonia): probable   Slurred speech   Acute on chronic renal failure   Acute respiratory distress   Hyponatremia   ASSESSMENT/PLAN:  79 year old female with acute diastolic heart failure, ejection fraction 65% up from 40% with chronic kidney disease, worsening respiratory status, elevated BNP despite diuresis.   1. Acute diastolic heart failure- Further diuresis is limited by renal failure.  I think we have about reached our limit with diuresis.  I agree with Dr Debby Bud that converting to PO lasix is reasonable.  2. Elevated troponin-secondary to acute heart failure episode. Not likely thrombotic event. Continue with  aspirin, Plavix, beta blocker.    3. Acute on chronic kidney disease stage III- limits our ability to diurese  4. Peripheral vascular disease-continue with both Plavix as well as aspirin. No evidence currently of mesenteric ischemia.   5. Hyponatremia-continue to monitor closely. Fluid restriction.  6. afib Rate controlled Poor candidate for triple anticoagulation  7. Hypokalemia Dr Debby Bud to replete  Prognosis is poor.  Palliative measures would seem appropriate.  I agree with consideration of hospice consultation.

## 2013-01-15 NOTE — Progress Notes (Addendum)
Physical Therapy Treatment Patient Details Name: Elizabeth Wall MRN: [id number] DOB: May 22, 1919 Today's Date: 01/15/2013 Time: 2952-8413 PT Time Calculation (min): 29 min  PT Assessment / Plan / Recommendation  History of Present Illness Pt admit for respiratory failure.  Pt with possible flu.  Troponins up at present.   PT Comments   Pt admitted with above. Pt currently with functional limitations due to balance and endurance deficits.  Pt will benefit from skilled PT to increase their independence and safety with mobility to allow discharge to the venue listed below. Decreased frequency to 2xweek secondary to pt NHP and making slow progress.  Thanks.  Follow Up Recommendations  SNF;Supervision/Assistance - 24 hour                 Equipment Recommendations  None recommended by PT        Frequency Min 2X/week   Progress towards PT Goals Progress towards PT goals: Progressing toward goals  Plan Current plan remains appropriate;Frequency needs to be updated    Precautions / Restrictions Precautions Precautions: Fall Restrictions Weight Bearing Restrictions: No   Pertinent Vitals/Pain VSS, no pain    Mobility  Bed Mobility Bed Mobility: Supine to Sit;Sitting - Scoot to Edge of Bed Supine to Sit: 1: +2 Total assist;With rails Supine to Sit: Patient Percentage: 40% Sitting - Scoot to Edge of Bed: 1: +1 Total assist Details for Bed Mobility Assistance: Max directional cues.  Pt initiated LE movevement to EOB but required total (A) to complete & for all other transitional movements.   Transfers Transfers: Sit to Stand;Stand to Sit;Stand Pivot Transfers Sit to Stand: 1: +2 Total assist;With upper extremity assist;From bed Sit to Stand: Patient Percentage: 40% Stand to Sit: 1: +2 Total assist;With armrests;With upper extremity assist;To chair/3-in-1 Stand to Sit: Patient Percentage: 50% Stand Pivot Transfers: 1: +2 Total assist Stand Pivot Transfers: Patient Percentage:  50% Details for Transfer Assistance: (A) to achieve standing, balance, rotation of hips from bed>recliner, & controlled descent.   Ambulation/Gait Ambulation/Gait Assistance: Not tested (comment) Stairs: No Wheelchair Mobility Wheelchair Mobility: No    Exercises General Exercises - Upper Extremity Shoulder Flexion: AROM;Both;10 reps;Seated Shoulder ABduction: AROM;Both;5 reps;Seated Elbow Flexion: AROM;Both;5 reps;Seated General Exercises - Lower Extremity Ankle Circles/Pumps: AROM;Both;10 reps;Seated Long Arc Quad: AROM;Both;5 reps;Seated Hip ABduction/ADduction: AROM;Both;10 reps;Seated Hip Flexion/Marching: AROM;Both;10 reps;Seated   PT Goals (current goals can now be found in the care plan section)    Visit Information  Last PT Received On: 01/15/13 Assistance Needed: +2 History of Present Illness: Pt admit for respiratory failure.  Pt with possible flu.  Troponins up at present.    Subjective Data  Subjective: "I want to try."   Cognition  Cognition Arousal/Alertness: Lethargic Behavior During Therapy: Flat affect Overall Cognitive Status: Impaired/Different from baseline Area of Impairment: Following commands;Awareness;Orientation;Attention Orientation Level: Disoriented to;Place;Time;Situation Current Attention Level: Focused Following Commands: Follows one step commands inconsistently    Insurance risk surveyor Sitting - Balance Support: Feet supported Static Sitting - Level of Assistance: 5: Stand by assistance Static Sitting - Comment/# of Minutes: 5 Static Standing Balance Static Standing - Balance Support: Bilateral upper extremity supported;During functional activity Static Standing - Level of Assistance: 3: Mod assist Static Standing - Comment/# of Minutes: 2  End of Session PT - End of Session Equipment Utilized During Treatment: Oxygen Activity Tolerance: Patient limited by lethargy Patient left: in chair;with call bell/phone within  reach;with family/visitor present Nurse Communication: Mobility status  [name],[name] 01/15/2013, 1:20 PM [facility] [phone] (pager)

## 2013-01-15 NOTE — Progress Notes (Signed)
PM note: There have been conversations in regard to disposition for Mr.s Elizabeth Wall. Dr. Johney Wall and this writer agree that we have reach maximum treatment threshold and that [name] has refractory heart failure with a poor, if not terminal prognosis. Her son, Elizabeth Wall, is aware of this and the family understands that she is unable to return home and at a minimum will require residential treatment, i.e. SNF.  In conversation with [name], who is weak and fatigued but appear lucid, she is made aware of her poor prognosis. The use of Hospice services to insure that a primary goal of comfort care occurs moving forward was explained to her. Furthermore, the option of residential hospice was discussed. She does seem to understand what has been imparted to her.  Dispo plan: 1. Evaluation as to eligibility for residential hospice. Given her refractory heart failure it is very possible that she has a 30 day prognosis. 2. If not deemed eligible for residential hospice she is a candidate for SNF with hospice consult.  A solution in [facility] is most desirable.  An attempt was made to contact Elizabeth Wall but there is no answer at home or work and the home voicemail box is full.  Chaplaincy consult is placed.

## 2013-01-15 NOTE — Progress Notes (Signed)
Subjective: Elizabeth Wall is awake. She had dreamed she had gone to [place]. No acute distress but disoriented.  Objective: Lab:  Recent Labs  01/13/13 0915  WBC 9.1  NEUTROABS 6.7  HGB 11.4*  HCT 33.2*  MCV 93.5  PLT 538*    Recent Labs  01/13/13 0915  NA 136  K 3.0*  CL 97  GLUCOSE 113*  BUN 72*  CREATININE 1.79*  CALCIUM 12.4*    Imaging:no new imaging  Scheduled Meds: . aspirin  81 mg Oral Daily  . benzonatate  100 mg Oral TID  . clopidogrel  75 mg Oral Daily  . enoxaparin (LOVENOX) injection  30 mg Subcutaneous Q24H  . febuxostat  80 mg Oral Daily  . folic acid  1 mg Oral Daily  . furosemide  80 mg Intravenous Q12H  . guaiFENesin  1,200 mg Oral BID  . levothyroxine  75 mcg Oral QAC breakfast  . metolazone  2.5 mg Oral Daily  . metoprolol  100 mg Oral BID  . pantoprazole  40 mg Oral Q0600  . piperacillin-tazobactam (ZOSYN)  IV  2.25 g Intravenous Q6H  . sodium chloride  3 mL Intravenous Q12H  . sodium chloride  3 mL Intravenous Q12H   Continuous Infusions:  PRN Meds:.sodium chloride, sodium chloride, acetaminophen, ondansetron (ZOFRAN) IV, RESOURCE THICKENUP CLEAR, sodium chloride, sodium chloride   Physical Exam: Filed Vitals:   01/14/13 2230  BP: 142/52  Pulse: 64  Temp: 97.3  Resp:     Intake/Output Summary (Last 24 hours) at 01/15/13 4742 Last data filed at 01/14/13 1832  Gross per 24 hour  Intake    360 ml  Output    300 ml  Net     60 ml   Total this adm: - 2,832  Wt Readings from Last 3 Encounters:  01/14/13 107 lb 9.4 oz (48.8 kg)  12/18/12 112 lb 6.4 oz (50.984 kg)  12/16/12 115 lb 12 oz (52.504 kg)   Gen'l - very elderly woman in no distress HEENT - dry mm, temporal wasting Cor - 2+ radial pulse, Reg rte Pulm - no increased WOB, rales at the bases, no wheeze Neuro - not oriented to place but oriented to person and examiner       Assessment/Plan: 2. Heart failure - continues to diurese. Still with rales. Plan Convert to  po furosemide 40 mg bid  Continue metolazone 2.5 mg q AM    3. Speech - SLP follow up note reviewed - on pureed diet  4. ID/pulm - has completed 7 days of antibiotics 4/4 rocephin/azithromycin, 3/3 zosyn Plan D/c antibiotic  F/u CBC  5. PAF - regular rate today  6. REnal - last Cr 1.79 12/29 to 2.06 12/31 Plan K replacement 0rally  Prognosis - poor Dispo - best for SNF with hospice consult   Illene Regulus  IM (o) [phone]; (c) [phone] Call-grp - [facility] IM  Tele: [phone]  01/15/2013, 6:39 AM

## 2013-01-16 LAB — CBC WITH DIFFERENTIAL/PLATELET
BASOS ABS: 0 10*3/uL (ref 0.0–0.1)
Basophils Relative: 0 % (ref 0–1)
Eosinophils Absolute: 0.1 10*3/uL (ref 0.0–0.7)
Eosinophils Relative: 1 % (ref 0–5)
HEMATOCRIT: 36.6 % (ref 36.0–46.0)
Hemoglobin: 12.1 g/dL (ref 12.0–15.0)
LYMPHS PCT: 22 % (ref 12–46)
Lymphs Abs: 2.1 10*3/uL (ref 0.7–4.0)
MCH: 32.1 pg (ref 26.0–34.0)
MCHC: 33.1 g/dL (ref 30.0–36.0)
MCV: 97.1 fL (ref 78.0–100.0)
MONO ABS: 0.8 10*3/uL (ref 0.1–1.0)
MONOS PCT: 8 % (ref 3–12)
Neutro Abs: 6.5 10*3/uL (ref 1.7–7.7)
Neutrophils Relative %: 69 % (ref 43–77)
Platelets: 534 10*3/uL — ABNORMAL HIGH (ref 150–400)
RBC: 3.77 MIL/uL — ABNORMAL LOW (ref 3.87–5.11)
RDW: 15.2 % (ref 11.5–15.5)
WBC: 9.4 10*3/uL (ref 4.0–10.5)

## 2013-01-16 LAB — BASIC METABOLIC PANEL
BUN: 73 mg/dL — ABNORMAL HIGH (ref 6–23)
CHLORIDE: 100 meq/L (ref 96–112)
CO2: 33 meq/L — AB (ref 19–32)
Calcium: 13.9 mg/dL (ref 8.4–10.5)
Creatinine, Ser: 1.86 mg/dL — ABNORMAL HIGH (ref 0.50–1.10)
GFR calc non Af Amer: 23 mL/min — ABNORMAL LOW (ref >90)
GFR, EST AFRICAN AMERICAN: 26 mL/min — AB (ref >90)
Glucose, Bld: 127 mg/dL — ABNORMAL HIGH (ref 70–99)
POTASSIUM: 3.1 meq/L — AB (ref 3.7–5.3)
Sodium: 147 mEq/L (ref 137–147)

## 2013-01-16 MED ORDER — POTASSIUM CHLORIDE 20 MEQ/15ML (10%) PO LIQD
60.0000 meq | Freq: Every day | ORAL | Status: DC
  Administered 2013-01-16 – 2013-01-17 (×2): 60 meq via ORAL
  Filled 2013-01-16 (×2): qty 45

## 2013-01-16 NOTE — Plan of Care (Signed)
Problem: Phase II Progression Outcomes Goal: Walk in hall or up in chair TID Outcome: Not Applicable Date Met:  94/32/76 Pt bedridden at present

## 2013-01-16 NOTE — Progress Notes (Signed)
Notified on-call [place] MD of low potassium of 3.1. Orders given to administer potassium per order. Will carry out orders per MD and continue to monitor patient to end of shift.

## 2013-01-16 NOTE — Progress Notes (Signed)
SUBJECTIVE: The patient is sleeping deeply this am.   CURRENT MEDICATIONS: . aspirin  81 mg Oral Daily  . clopidogrel  75 mg Oral Daily  . enoxaparin (LOVENOX) injection  30 mg Subcutaneous Q24H  . febuxostat  80 mg Oral Daily  . folic acid  1 mg Oral Daily  . furosemide  40 mg Oral BID  . guaiFENesin  1,200 mg Oral BID  . levothyroxine  75 mcg Oral QAC breakfast  . metolazone  2.5 mg Oral Daily  . metoprolol  100 mg Oral BID  . pantoprazole  40 mg Oral Q0600  . sodium chloride  3 mL Intravenous Q12H  . sodium chloride  3 mL Intravenous Q12H      OBJECTIVE: Physical Exam: Filed Vitals:   01/15/13 0700 01/15/13 1308 01/15/13 2131 01/16/13 0410  BP: 153/60 140/55 136/58 139/54  Pulse: 59 65 65 69  Temp: 97.8 F (36.6 C) 97.5 F (36.4 C) 97.4 F (36.3 C) 97.5 F (36.4 C)  TempSrc: Oral Oral Oral Oral  Resp: 17 18 18 17   Height:      Weight: 105 lb 9.6 oz (47.9 kg)   105 lb 2.6 oz (47.7 kg)  SpO2: 100% 100% 99% 98%    Intake/Output Summary (Last 24 hours) at 01/16/13 0735 Last data filed at 01/15/13 2303  Gross per 24 hour  Intake    280 ml  Output    201 ml  Net     79 ml    Telemetry - non tele  GEN- The patient is chronically ill appearing, sleeping but arouses Head- normocephalic, atraumatic Eyes-  Sclera clear, conjunctiva pink Ears- hearing intact Oropharynx- clear Neck- supple,  Lungs- tachypneic, short shallow breaths, + rales (improved) Heart- Regular rate and rhythm, decreased HS GI- soft, NT, ND, + BS Extremities- no clubbing, cyanosis, or edema Frail and thin  LABS: Basic Metabolic Panel:  Recent Labs  91/47/8212/29/14 0915 01/15/13 0525  NA 136 144  K 3.0* 3.1*  CL 97 101  CO2 27 31  GLUCOSE 113* 98  BUN 72* 71*  CREATININE 1.79* 2.06*  CALCIUM 12.4* 12.8*   CBC:  Recent Labs  01/13/13 0915  WBC 9.1  NEUTROABS 6.7  HGB 11.4*  HCT 33.2*  MCV 93.5  PLT 538*    RADIOLOGY: Dg Chest 2 View 01/13/2013   CLINICAL DATA:   Congestive heart failure.  Pneumonia.  EXAM: CHEST  2 VIEW  COMPARISON:  01/11/2013  FINDINGS: Bilateral airspace opacities appear mildly worsened. Peripheral interstitial accentuation suggests at least a component of congestive heart failure. Cardiomegaly is present. Atherosclerotic aortic arch. Thoracic spondylosis.  Severe right glenohumeral arthropathy. Small bilateral pleural effusions.  IMPRESSION: 1. Mildly worsened bilateral airspace opacities. The cardiomegaly and peripheral interstitial accentuation suggests that some or all of the airspace opacities are due to congestive heart failure, although superimposed pneumonia is difficult to exclude. 2. Stable right glenohumeral arthropathy. 3. Small bilateral pleural effusions.   Electronically Signed   By: Herbie BaltimoreWalt  Liebkemann M.D.   On: 01/13/2013 08:14   ASSESSMENT AND PLAN:  Principal Problem:   Respiratory failure Active Problems:   HYPOTHYROIDISM   ANXIETY   HYPERTENSION   CORONARY ARTERY DISEASE   CAROTID ARTERY STENOSIS, WITHOUT INFARCTION   GASTROESOPHAGEAL REFLUX DISEASE   CHRONIC KIDNEY DISEASE UNSPECIFIED   Atrial flutter   Iron deficiency anemia   Hyperlipidemia   Mesenteric artery insufficiency   Acute systolic CHF (congestive heart failure)   CAP (community acquired pneumonia): probable  Slurred speech   Acute on chronic renal failure   Acute respiratory distress   Hyponatremia   ASSESSMENT/PLAN:  80 year old female with acute diastolic heart failure, ejection fraction 65% up from 40% with chronic kidney disease, worsening respiratory status, elevated BNP despite diuresis.   1. Acute diastolic heart failure- Further diuresis is limited by renal failure.  I think we have about reached our limit with diuresis.  I agree with Dr Debby Bud that palliative measures are appropriate.  BMET not ordered today.  I will defer this to Dr Norins/ primary team.  2. Elevated troponin-secondary to acute heart failure episode. Not likely  thrombotic event. Continue with aspirin, Plavix, beta blocker.  No further work-up planned  3. Acute on chronic kidney disease stage III- limits our ability to diurese  4. Peripheral vascular disease-continue with both Plavix as well as aspirin. No evidence currently of mesenteric ischemia.   5. Hyponatremia-continue to monitor closely. Fluid restriction.  6. afib Rate controlled Poor candidate for triple anticoagulation  7. Hypokalemia Per primary  team  Prognosis is poor.  Palliative measures would seem appropriate.  I agree with consideration of hospice consultation. I will see as needed going forward. Please call if I can be of further assistance.

## 2013-01-16 NOTE — Progress Notes (Addendum)
Subjective: Patient is comfortable and tearful this morning. Minimal respiratory distress. Very cachectic. Extreme lethargy. She denies any focal pain. Very sad about her overall medical condition  Objective: Weight change: -1.1 kg (-2 lb 6.8 oz)  Intake/Output Summary (Last 24 hours) at 01/16/13 1016 Last data filed at 01/15/13 2303  Gross per 24 hour  Intake    160 ml  Output    201 ml  Net    -41 ml   Filed Vitals:   01/15/13 0700 01/15/13 1308 01/15/13 2131 01/16/13 0410  BP: 153/60 140/55 136/58 139/54  Pulse: 59 65 65 69  Temp: 97.8 F (36.6 C) 97.5 F (36.4 C) 97.4 F (36.3 C) 97.5 F (36.4 C)  TempSrc: Oral Oral Oral Oral  Resp: $Remo'17 18 18 17  'Xhahl$ Height:      Weight: 47.9 kg (105 lb 9.6 oz)   47.7 kg (105 lb 2.6 oz)  SpO2: 100% 100% 99% 98%   Gen'l- elderly and cachectic, frail woman  HEENT- dry mm  Cor 2+ radial, regular rate with PVCs associated with cough. Rate is controlled  Pulm - distant breath sounds bilaterally anteriorly  Abd - soft  Neuro - awake, nonfocal, patient is conversant but extremely weak. She had about her condition   Lab Results: Results for orders placed during the hospital encounter of 01/09/13 (from the past 48 hour(s))  BASIC METABOLIC PANEL     Status: Abnormal   Collection Time    01/15/13  5:25 AM      Result Value Range   Sodium 144  137 - 147 mEq/L   Comment: Please note change in reference range.     DELTA CHECK NOTED   Potassium 3.1 (*) 3.7 - 5.3 mEq/L   Comment: Please note change in reference range.   Chloride 101  96 - 112 mEq/L   CO2 31  19 - 32 mEq/L   Glucose, Bld 98  70 - 99 mg/dL   BUN 71 (*) 6 - 23 mg/dL   Creatinine, Ser 2.06 (*) 0.50 - 1.10 mg/dL   Calcium 12.8 (*) 8.4 - 10.5 mg/dL   GFR calc non Af Amer 20 (*) >90 mL/min   GFR calc Af Amer 23 (*) >90 mL/min   Comment: (NOTE)     The eGFR has been calculated using the CKD EPI equation.     This calculation has not been validated in all clinical situations.   eGFR's persistently <90 mL/min signify possible Chronic Kidney     Disease.    Studies/Results: No results found. Medications: Scheduled Meds: . aspirin  81 mg Oral Daily  . clopidogrel  75 mg Oral Daily  . enoxaparin (LOVENOX) injection  30 mg Subcutaneous Q24H  . febuxostat  80 mg Oral Daily  . folic acid  1 mg Oral Daily  . furosemide  40 mg Oral BID  . guaiFENesin  1,200 mg Oral BID  . levothyroxine  75 mcg Oral QAC breakfast  . metolazone  2.5 mg Oral Daily  . metoprolol  100 mg Oral BID  . pantoprazole  40 mg Oral Q0600  . sodium chloride  3 mL Intravenous Q12H  . sodium chloride  3 mL Intravenous Q12H   Continuous Infusions:  PRN Meds:.sodium chloride, sodium chloride, acetaminophen, ondansetron (ZOFRAN) IV, RESOURCE THICKENUP CLEAR, sodium chloride, sodium chloride  Assessment/Plan: Principal Problem:   Respiratory failure Active Problems:   HYPOTHYROIDISM   ANXIETY   HYPERTENSION   CORONARY ARTERY DISEASE   CAROTID ARTERY STENOSIS,  WITHOUT INFARCTION   GASTROESOPHAGEAL REFLUX DISEASE   CHRONIC KIDNEY DISEASE UNSPECIFIED   Atrial flutter   Iron deficiency anemia   Hyperlipidemia   Mesenteric artery insufficiency   Acute systolic CHF (congestive heart failure)   CAP (community acquired pneumonia): probable   Slurred speech   Acute on chronic renal failure   Acute respiratory distress   Hyponatremia  Assessment/Plan:  1. Heart failure - has diuresed almost 3 liters but sounds worse and x-ray was worse. Cardiology help appreciated.  Plan continue Zaroxolyn and Lasix. We'll check basic metabolic profile today 2. Speech - on thickened liquids  3. ID/Pulm - 4/4 rocephin/azithromycin. #3 Zosyn. No fever, no sputum produciton. CXR favors pul edema  Plan continue zosyn - will allow Dr. Linda Hedges to have further input in a.m.  4. PAF - in regular rhythm with frequent PVCs this AM  5. Renal - check basic metabolic profile today  Prognosis - very poor. May be at end  stage heat failure. Dr. Linda Hedges spoke with son Linna Hoff yesterday - he understands the situation.                     We'll consult through social work for placement in hospice facility     LOS: 7 days   Henrine Screws, MD 01/16/2013, 10:16 AM

## 2013-01-16 NOTE — Progress Notes (Addendum)
Speech Language Pathology Treatment:    Patient Details Name: Elizabeth Wall MRN: [id number] DOB: 1919/09/16 Today's Date: 01/16/2013 Time:13:00  - 13:15    Assessment / Plan / Recommendation Clinical Impression  ST to f/u for diet tolerance of recent diet downgrade to dysphagia 1(puree) and nectar thick liquids.  Not observed with any PO trials due to patient presenting with lethargy with inability to participate fully. Nursing reports continued decreased PO intake.   Treatment focused on providing education to family member present and caregivers on recommendations to continue aspiration precautions and administer PO's sparingly only when fully alert and indicating thirst. Patient's son stated that they did not want to pursue "feeding tube".   Aware of recommendations for Palliative Care Consult.    ST to sign off due to change in medical status. Education complete.            Moreen FowlerKaren Ryne Mctigue MS, CCC-SLP [phone] [facility] Ltd[facility]ANKOF,Elizabeth Wall 01/16/2013, 2:17 PM

## 2013-01-17 MED ORDER — METOLAZONE 2.5 MG PO TABS: 2.5000 mg | ORAL_TABLET | Freq: Every day | ORAL | Status: AC

## 2013-01-17 MED ORDER — ENSURE PLUS HN PO LIQD: 1.0000 | Freq: Two times a day (BID) | ORAL | Status: AC

## 2013-01-17 MED ORDER — FUROSEMIDE 40 MG PO TABS: 40.0000 mg | ORAL_TABLET | Freq: Two times a day (BID) | ORAL | Status: AC

## 2013-01-17 NOTE — Discharge Summary (Signed)
Elizabeth Wall, HAUTH NO.:  [id number]  MEDICAL RECORD NO.:  [id number]  LOCATION:  4E09C                        FACILITY:  [facility]  PHYSICIAN:  Rosalyn Gess. Norins, MD  DATE OF BIRTH:  08/24/1919  DATE OF ADMISSION:  01/09/2013 DATE OF DISCHARGE:                              DISCHARGE SUMMARY   DATE OF TRANSFER TO HOSPICE HOUSE:  January 17, 2013.  ADMITTING DIAGNOSES: 1. Acute respiratory failure, secondary to acute-on-chronic congestive     heart failure and probable community-acquired pneumonia. 2. Acute-on-chronic congestive heart failure. 3. Slurred speech. 4. Community-acquired pneumonia. 5. Atrial fibrillation. 6. Acute on chronic kidney disease. 7. Hypertension. 8. Hypothyroidism. 9. Hyponatremia. 10.Coronary artery disease.  DISCHARGE DIAGNOSES: 1. Acute respiratory failure, secondary to acute-on-chronic congestive     heart failure and probable community-acquired pneumonia. 2. Acute-on-chronic congestive heart failure. 3. Slurred speech. 4. Community-acquired pneumonia. 5. Atrial fibrillation. 6. Acute on chronic kidney disease. 7. Hypertension. 8. Hypothyroidism. 9. Hyponatremia. 10.Coronary artery disease.  CONSULTANTS:  Dr. Hillis Range for Cardiology.  IMAGING: 1. Portable chest x-ray, December, 25, which showed congestive heart     failure.  Question superimposed pneumonia at the bases.  Chronic     rotator cuff tear on the right. 2. CT of the head without contrast, January 09, 2013.  There is     generalized atrophy expected for age with no acute abnormality     noted. 3. Ultrasound of the kidney performed January 10, 2013, read out as     multiple bilateral renal cysts.  A 2.1 cm lesion in the right     kidney with internal septations, it has increased in size since     March 13, 2009. 4. Chest x-ray, portable January 11, 2013, which showed worsening     diffuse airspace disease and cardiomegaly favoring a worsening CHF. 5.  Chest x-ray, January 13, 2013, which showed mildly worsened     bilateral airspace opacities.  This favors congestive heart     failure.  Stable right glenohumeral arthropathy.  Small bilateral     pleural effusions are noted.  HISTORY OF PRESENT ILLNESS:  Elizabeth Wall is a 80 year old woman with a history of CAD, cardiomegaly, CKD, CVA, history of atrial fibrillation on aspirin and Plavix, peripheral vascular disease, mesenteric ischemia, status post surgical intervention, who presented to the emergency department on the day of admission with a 1-week history of dry cough, weakness, shortness of breath, chills, nausea, headache and slurred speech.  Her son is the primary historian.  The patient denies any chest pain.  There was no evidence of fever.  She had no emesis, dysuria, abdominal pain, facial droop, asymmetric weakness or other associated symptoms.  Of note, the patient had been on beta-blocker for atrial fibrillation which was discontinued resulting in rapid tachycardia. Beta-blocker was restarted prior to admission.  In the emergency department, the patient had chest x-ray consistent with pulmonary edema with a question of possible community acquired pneumonia.  BNP was elevated at 19,832.  Sodium was low at 129, BUN and creatinine showed elevation at 83 and 2.37 respectively.  EKG with normal sinus rhythm and PVCs.  The patient was subsequently admitted to the  hospital for management of problems as outlined above.  Please see the H and P for past medical history, family history, social history, and admission examination.  HOSPITAL COURSE: 1. Cardiology.  Patient with acute-on-chronic heart failure.  The     patient was given large doses of IV Lasix, which did not provide     significant relief of her symptoms or change in x-ray, although she     did have a 3 L diuresis.  Cardiology saw the patient in     consultation.  There was no significant change in the patient's      medical regimen other than diuretics.  IV diuretics were stopped     due to progressive renal insufficiency, and she was switched to     p.o.  Lasix with Zaroxolyn.  On this regimen she started re-     accumulation of fluid.  It was the opinion of Cardiology as well as     this dictator that the patient had end-stage heart failure.  This     was discussed with the patient and her family.  It is felt at this     point, given her advanced age and poor response to therapy that she     is a candidate for comfort care, and will be transferred to     [facility]. 2. Swallow.  The patient was seen by Speech Pathology and she was     found to have a swallowing disorder with question of mild     aspiration.  She was put to a pureed diet and nectar thick liquids,     as well as swallow precautions.  The patient did relatively well on     this regimen, although her p.o. intake was poor. 3. ID/Pulmonary.  The patient diagnosed with possible community-     acquired pneumonia.  She received 4 days of Rocephin and     azithromycin and then was switched to Zosyn for an additional 3     days to cover her for possible aspiration pneumonia.  The patient     remained afebrile.  She had no cough.  Chest x-ray was more     favorable for fluid overload rather than active pneumonia.  After a     full 7 days of antibiotics, Zosyn was discontinued. 4. Paroxysmal atrial fibrillation.  The patient has had good rate     control during this admission and will continue on her home     medications. 5. Acute-on-chronic renal insufficiency.  The patient with high-dose     IV diuretics had progressive renal insufficiency.  IV diuretics     were discontinued for this reason.  The patient did have     hypokalemia and her potassium was repleted.  With the patient's multiple medical problems and failure to respond to therapy with a very poor prognosis with probable end-stage heart disease,  at this point, she is a candidate for residential hospice care. The facility does have a bed available at [facility] and at this time the patient is stable and ready for transfer.  This has been discussed with the patient and her family and her sons are in agreement with this plan.  DISCHARGE EXAMINATION:  Performed on the morning of discharge. VITAL SIGNS:  Temperature was 98, blood pressure 153/54, heart rate 61, respirations 18, oxygen saturation was 100% on 2 L of oxygen. GENERAL APPEARANCE:  This is a very elderly frail woman, who  is in no acute distress but does have labored respirations. HEENT:  Mild temporal wasting is noted.  She has kind of sunken eyes. Patient's mucous membranes are dry. NECK:  Supple.  There is no thyromegaly. NODES:  No adenopathy is appreciated in the cervical supraclavicular regions. CARDIOVASCULAR:  2+ radial pulse.  She has an irregular, irregular heart rate, which is well controlled on this morning's exam.  She had 4 cm of JVD at the 40 degrees angle.  Precordium was quiet. PULMONARY:  Patient has bibasilar rales.  No wheezing is appreciated. Patient's inspirations are shallow.  She does have mild increased work of breathing. ABDOMEN:  Soft.  No guarding or rebound was appreciated. PELVIC:  Deferred. EXTREMITIES:  Were thin with interosseous wasting but no actual deformity. NEURO: The patient has been very confused, disoriented to person and place, but she does have occasional insight into her situation.  She is appropriately grieving the diagnosis and prognosis that she has been given and stating that she has many things left that she wants to do. She does understand that she cannot return to home, although she finds this hard to accept.  FINAL LABORATORY:  From January 1, revealed a sodium of 147, potassium 3.1, chloride 100, CO2 of 33, BUN of 73, creatinine of 1.86 with an estimated GFR of 23 mL/minute.  Glucose was 127.  CBC with a white  count of 9400, hemoglobin 12.1 g, platelet count 534,000, differential is normal.  DISCHARGE MEDICATIONS: 1. Aspirin 81 mg daily. 2. Plavix 75 mg daily. 3. Uloric 80 mg daily for gout prevention. 4. Folic acid 1 mg daily. 5. Furosemide 80 mg, that is 40 mg b.i.d. 6. Mucinex 1200 mg b.i.d. 7. Levothyroxine 75 mcg daily. 8. Methotrexate 2.5 mg to take 6 tablets once a week. 9. Metolazone 2.5 mg q.a.m. 10.Lopressor 100 mg b.i.d. 11.Omeprazole 20 mg q.a.m. 12.Zofran 4 mg p.o. p.r.n. 13.Ensure 1 can twice a day between meals.  DISPOSITION:  The patient is ready for transfer to residential hospice facility.  Out of facility order has been signed.  The patient may follow all facility protocols.  Changes in medications per facility physician as the patient's condition deteriorates.  [name] condition at time of discharge dictation is for end of life care with a terminal prognosis.     Rosalyn GessMichael E. Norins, MD     MEN/MEDQ  D:  01/17/2013  T:  01/17/2013  Job:  [id number]

## 2013-01-17 NOTE — Progress Notes (Signed)
Chaplain responded to end of life consult. Pt was present with her grandson. Chaplain addressed pt, who talked about how her grandson is going to [place], [place]. Pt was clearly very proud of her grandson. Pt dozed off. Chaplain practiced active listening and empathic presence. Pt did not seem to have further needs at this time.

## 2013-01-17 NOTE — Progress Notes (Signed)
Ready for transfer to residential hospice. Out of facility order signed. Priority d/c dicatation completed at 13:25 hrs, dictation # [id number]

## 2013-01-17 NOTE — Progress Notes (Signed)
[facility]. Ok per Dr. Debby BudNorins for d/c today to facility  Patient and family agree to this d/c plan. Nursing notified and will call report. CSW provided support to family and patient. No further CSW needs identified. CSW signing off.  Lorri Frederickonna T. West PughCrowder, LCSWA  [phone]

## 2013-01-17 NOTE — Progress Notes (Signed)
[facility] and follow up. Per Dr. Debby BudNorins- anticipates life expectancy of 30 days or less.  Patient, son Jesusita OkaDan and family are aware.  CSW will provide support and follow up for possible residentiual hospice home.  Lorri Frederickonna T. West PughCrowder, LCSWA  [phone]

## 2013-01-17 NOTE — Progress Notes (Signed)
Subjective: Confused. Her friend who sat with her overnight said that she talked non-stop. She is upset about her prognosis and states she has much to do yet. Seh is not in distress   Objective: Lab:  Recent Labs  01/16/13 1110  WBC 9.4  NEUTROABS 6.5  HGB 12.1  HCT 36.6  MCV 97.1  PLT 534*    Recent Labs  01/15/13 0525 01/16/13 1110  NA 144 147  K 3.1* 3.1*  CL 101 100  GLUCOSE 98 127*  BUN 71* 73*  CREATININE 2.06* 1.86*  CALCIUM 12.8* 13.9*    Imaging:  Scheduled Meds: . aspirin  81 mg Oral Daily  . clopidogrel  75 mg Oral Daily  . enoxaparin (LOVENOX) injection  30 mg Subcutaneous Q24H  . febuxostat  80 mg Oral Daily  . folic acid  1 mg Oral Daily  . furosemide  40 mg Oral BID  . guaiFENesin  1,200 mg Oral BID  . levothyroxine  75 mcg Oral QAC breakfast  . metolazone  2.5 mg Oral Daily  . metoprolol  100 mg Oral BID  . pantoprazole  40 mg Oral Q0600  . potassium chloride  60 mEq Oral Daily  . sodium chloride  3 mL Intravenous Q12H  . sodium chloride  3 mL Intravenous Q12H   Continuous Infusions:  PRN Meds:.sodium chloride, sodium chloride, acetaminophen, ondansetron (ZOFRAN) IV, RESOURCE THICKENUP CLEAR, sodium chloride, sodium chloride   Physical Exam: Filed Vitals:   01/17/13 0527  BP: 153/54  Pulse: 61  Temp: 98 F (36.7 C)  Resp: 18   Wt Readings from Last 3 Encounters:  01/17/13 98 lb 1.7 oz (44.5 kg)  12/18/12 112 lb 6.4 oz (50.984 kg)  12/16/12 115 lb 12 oz (52.504 kg)    Intake/Output Summary (Last 24 hours) at 01/17/13 [id number] Last data filed at 01/17/13 0413  Gross per 24 hour  Intake    480 ml  Output      0 ml  Net    480 ml   Total this Adm: -1,993 Gen'l- very elderly woman in no physical distress but has angst HEENT- dry mm Cor - 2+ radial, Reg rate with PVCs PUlm - shallow inspirations, no increased WOB, no rales but poor inhalation       Assessment/Plan: 1. Card - refractory heart failure. On PO lasix with  zaroxolyn she has had positive I/O.  Plan Continue present regimen  2. Speech/swallow - appreciate SLP f/u consult  3. ID/Pulm - she ha completed 7 days of antibiotics w/o no continuing evidence of infection. Plan D/c zosyn  4. PAF -  Regular rate on exam  5. REnal  - creatinine stable. Potassium repleted.  Prognosis - very poor. On oral meds she is retaining fluid. She is a candidate for residential hospice in my opinion.    Illene RegulusMichael Norins [facility] (o) [phone]; (c) [phone] Call-grp - [facility]  Tele: [phone]  01/17/2013, 6:18 AM

## 2013-02-04 ENCOUNTER — Telehealth: Payer: Self-pay

## 2013-02-04 NOTE — Telephone Encounter (Signed)
Patient past away @ [facility] [place]

## 2013-02-16 DEATH — deceased

## 2013-05-05 ENCOUNTER — Ambulatory Visit: Payer: Medicare Other | Admitting: Surgery

## 2013-05-05 ENCOUNTER — Other Ambulatory Visit (HOSPITAL_COMMUNITY): Payer: Medicare Other

## 2013-12-25 ENCOUNTER — Encounter (HOSPITAL_COMMUNITY): Payer: Self-pay | Admitting: Surgery

## 2014-08-25 IMAGING — DX DG CHEST 1V PORT
1 series · 1 of 1 positions shown · non-contrast
Comparison: June 18, 2009

CLINICAL DATA: Shortness of Breath

EXAM:
PORTABLE CHEST - 1 VIEW

[portable]
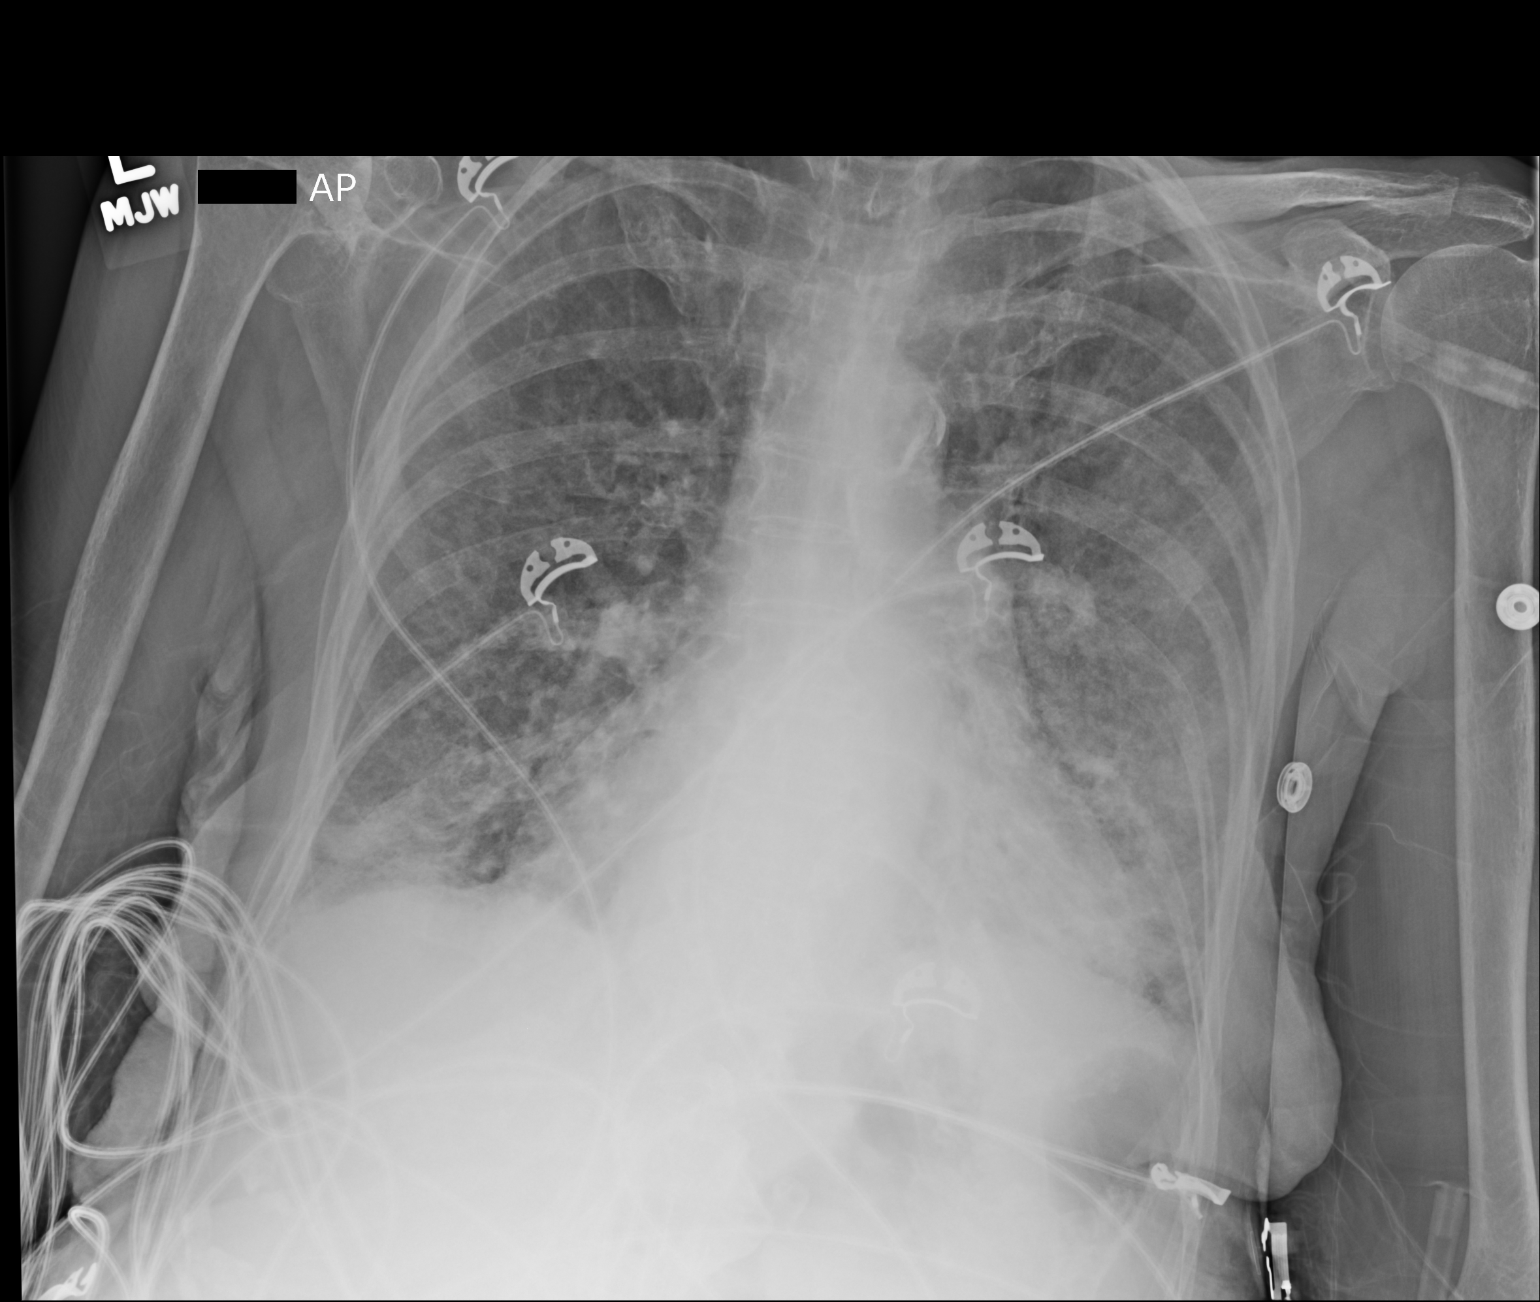

[1 of 1 positions shown; findings below may reference images not displayed]

FINDINGS: There is diffuse interstitial edema. There is patchy airspace
consolidation in the bases. Heart is enlarged with pulmonary venous
hypertension. No adenopathy.

There is atherosclerotic change in aorta. There is extensive
arthropathy in the right shoulder with chronic rotator cuff tear on
the right.
IMPRESSION: Congestive heart failure. Question superimposed pneumonia in the
bases. Chronic rotator cuff tear on the right.

## 2014-08-25 IMAGING — CT CT HEAD W/O CM
2 series · 16 of 30 positions shown, 20 images · non-contrast
Comparison: CT 03/13/2009

CLINICAL DATA: Slurred speech.  Weakness.

EXAM:
CT HEAD WITHOUT CONTRAST
TECHNIQUE: Contiguous axial images were obtained from the base of the skull
through the vertex without intravenous contrast.

[Series 2: head w/o · axial · non-contrast · 0.43mm/px · z∈[+81,+201]mm · 13 of 29 slices shown, 17 images]
[im 3/29  brain]
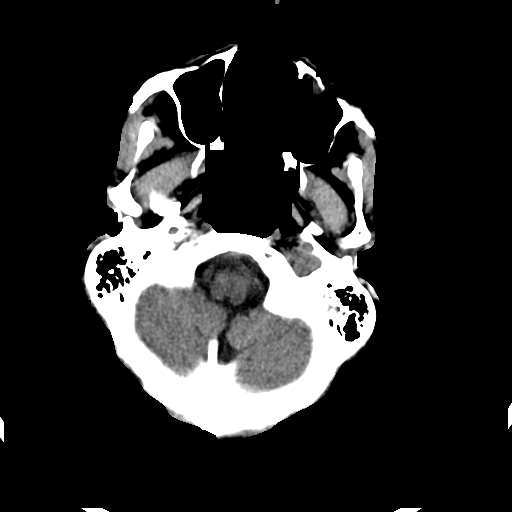
[im 3/29  bone]
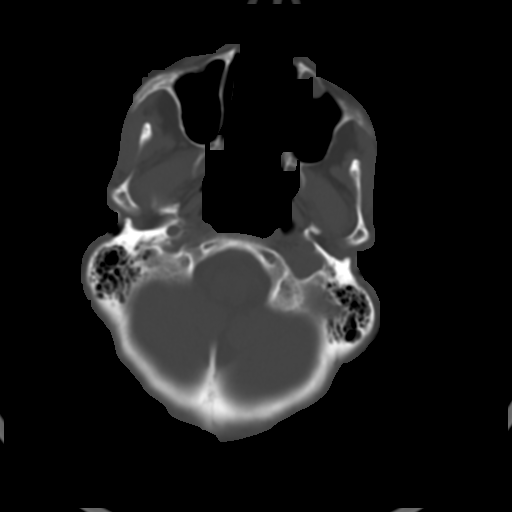
[im 5/29  brain]
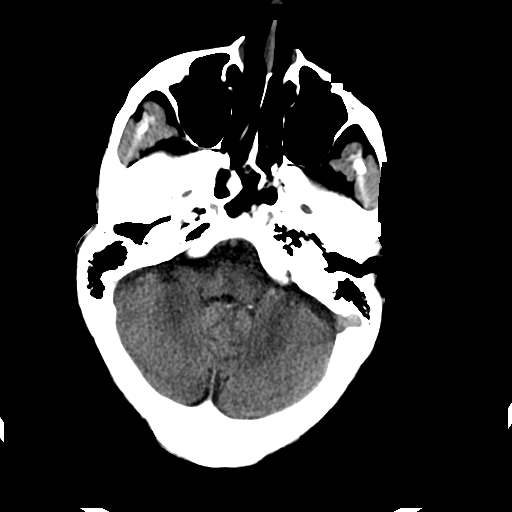
[im 7/29  brain]
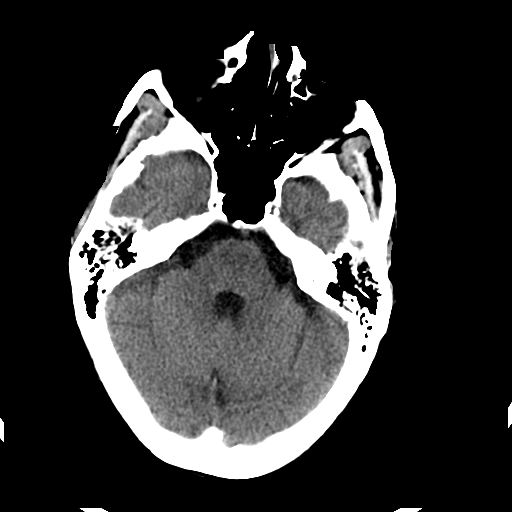
[im 9/29  brain]
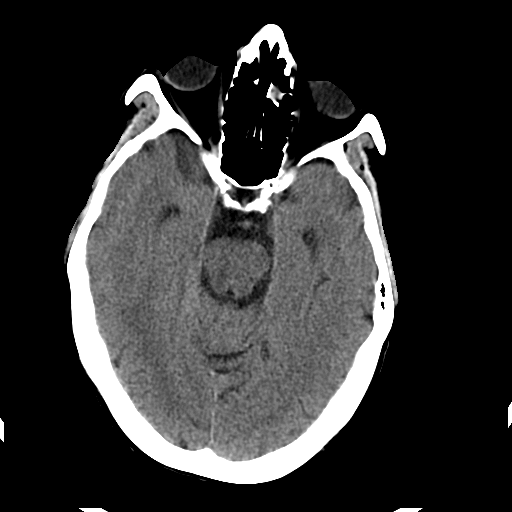
[im 11/29  brain]
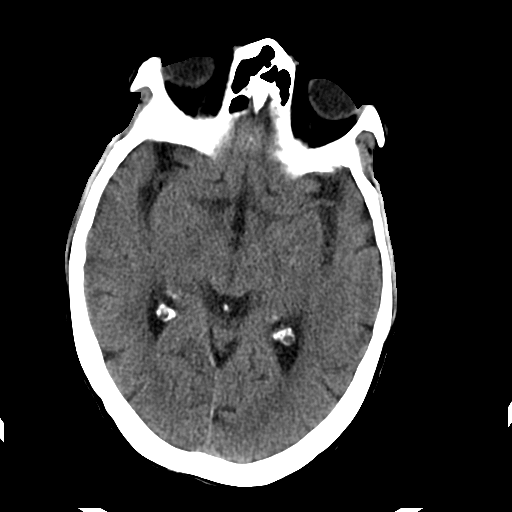
[im 11/29  bone]
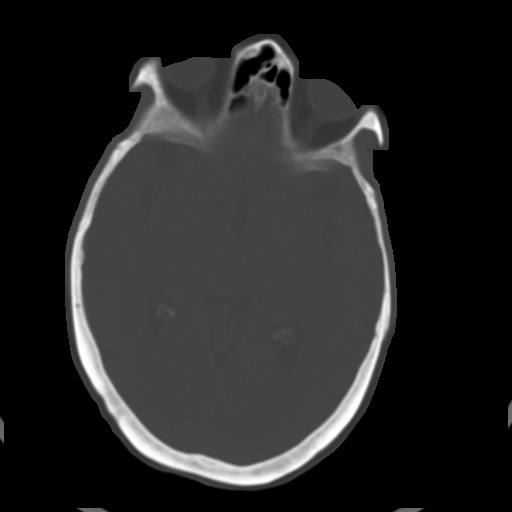
[im 13/29  brain]
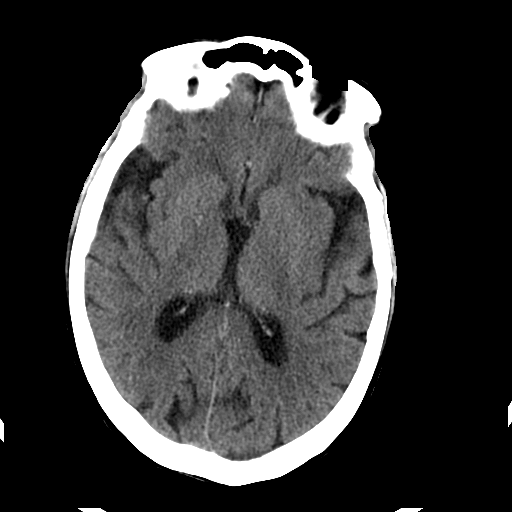
[im 15/29  brain]
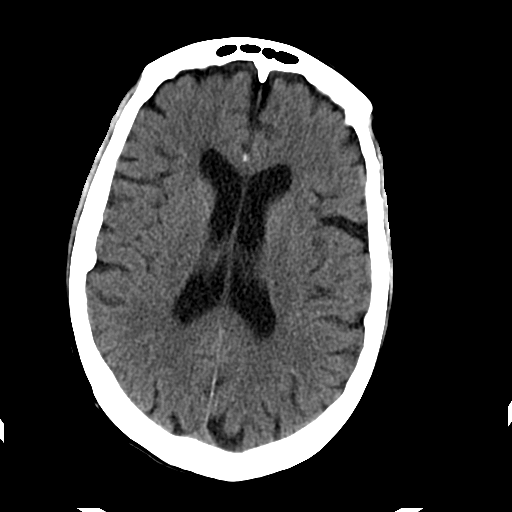
[im 17/29  brain]
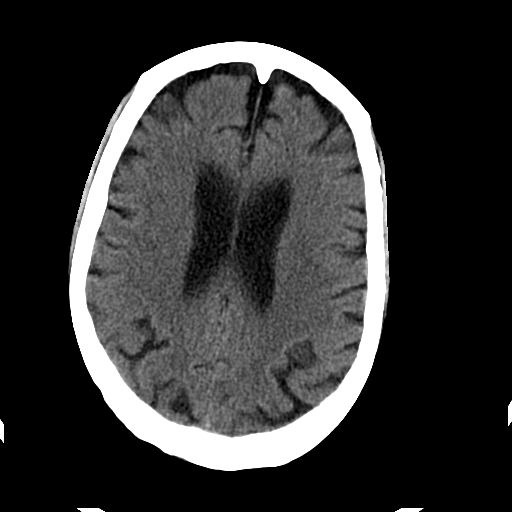
[im 19/29  brain]
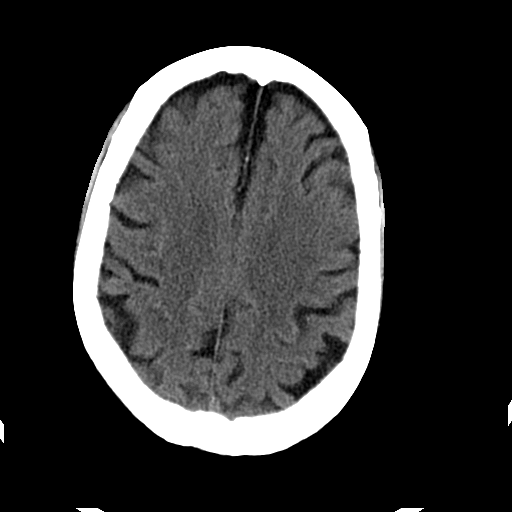
[im 19/29  bone]
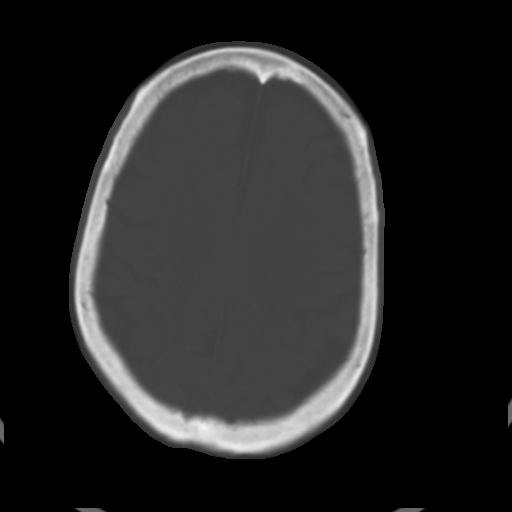
[im 21/29  brain]
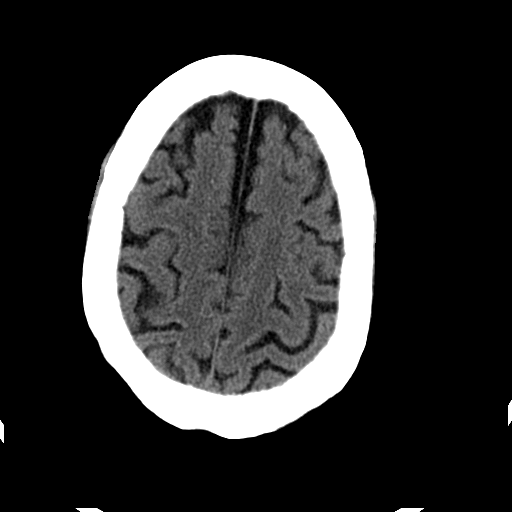
[im 23/29  brain]
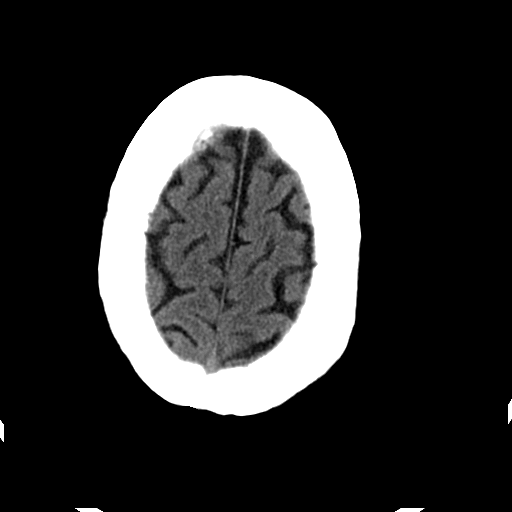
[im 25/29  brain]
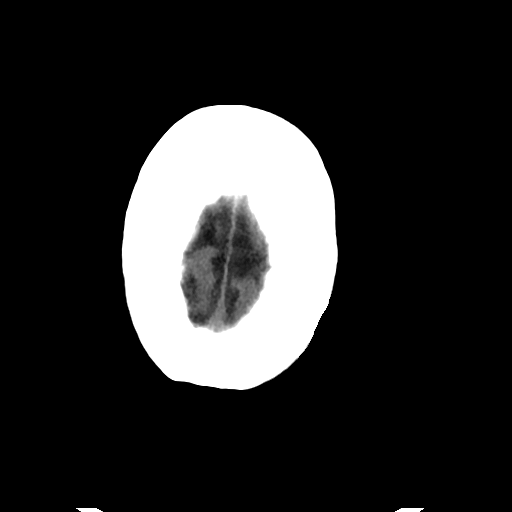
[im 27/29  brain]
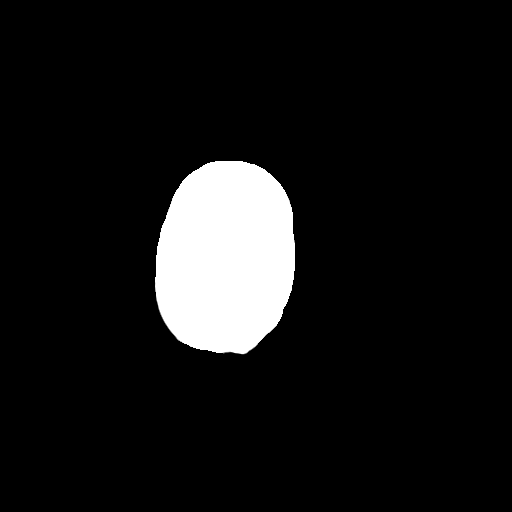
[im 27/29  bone]
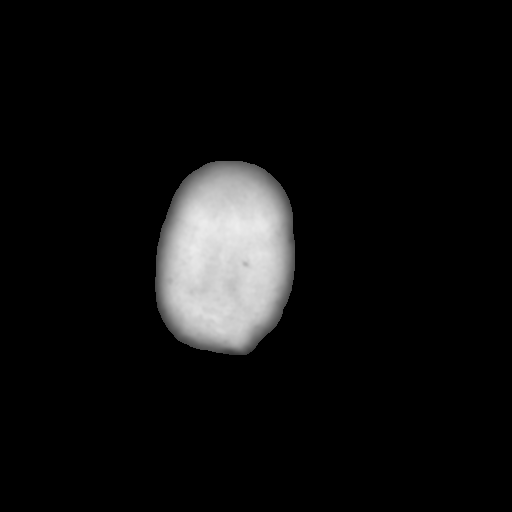

[Series 3: head w/o bone · axial · non-contrast · 0.43mm/px · z∈[+81,+121]mm · 3 of 29 slices shown]
[im 3/29  bone]
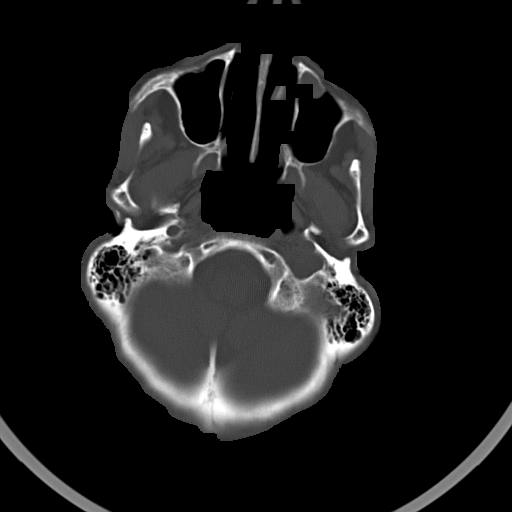
[im 7/29  bone]
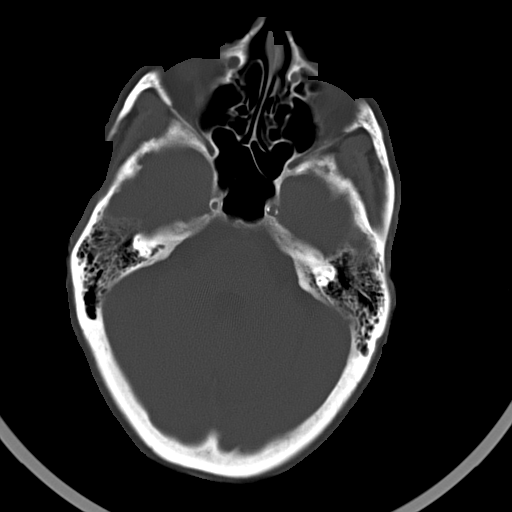
[im 11/29  bone]
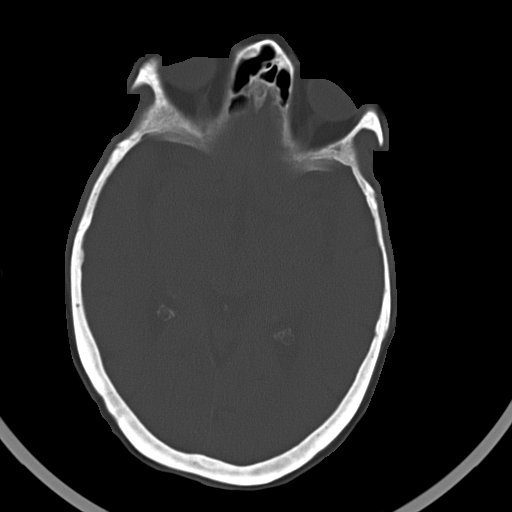

[16 of 30 positions shown; findings below may reference images not displayed]

FINDINGS: Generalized atrophy. Negative for acute infarct. Negative for
hemorrhage or mass. The skull is intact.
IMPRESSION: Generalized atrophy, expected for age.  No acute abnormality.

## 2014-08-26 IMAGING — US US RENAL
1 series · 13 of 25 positions shown · non-contrast
Comparison: Abdominal CTA from 03/13/2009

CLINICAL DATA: Hypertension with chronic kidney disease.

EXAM:
RENAL/URINARY TRACT ULTRASOUND COMPLETE

[Series 1: us renal · 0.22mm/px · 13 of 40 slices shown]
[im 1/40]
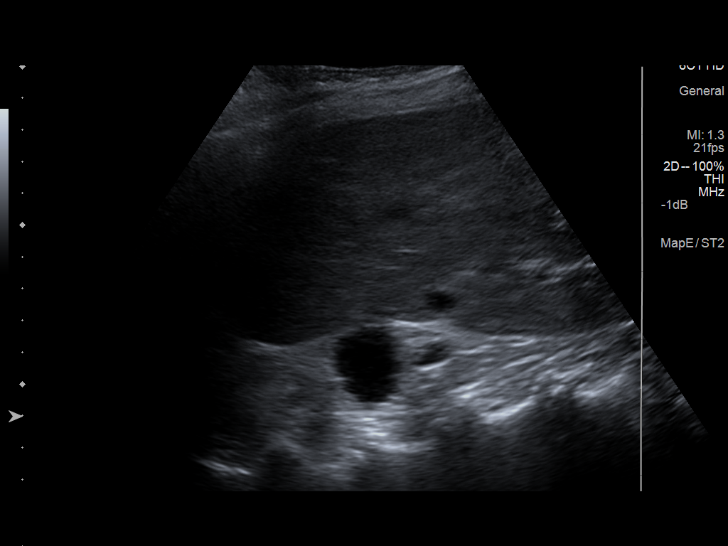
[im 4/40]
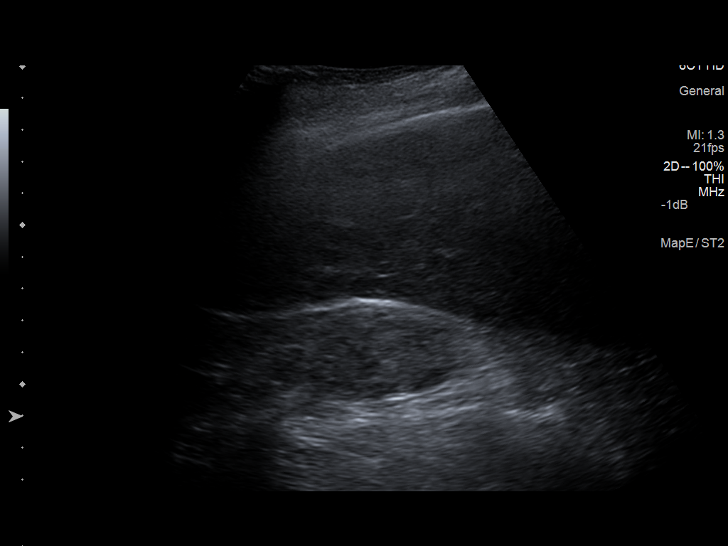
[im 7/40]
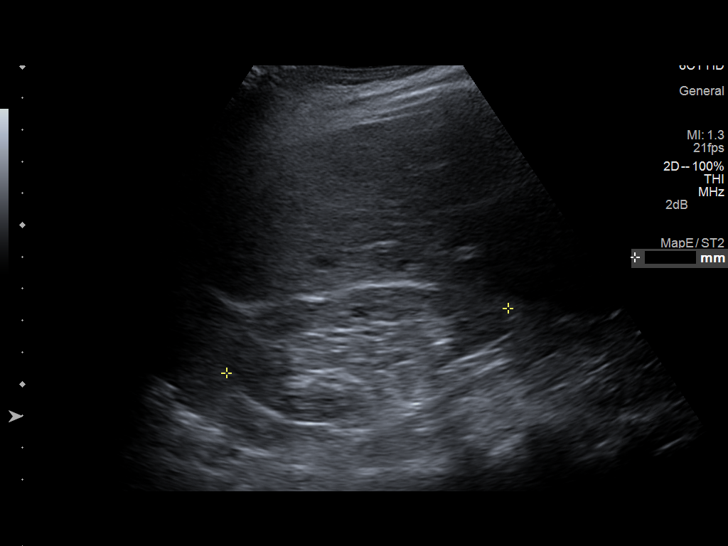
[im 10/40]
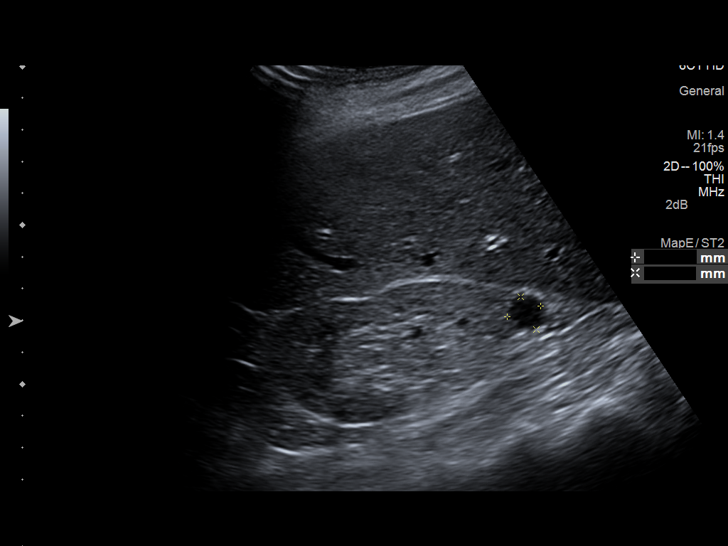
[im 14/40]
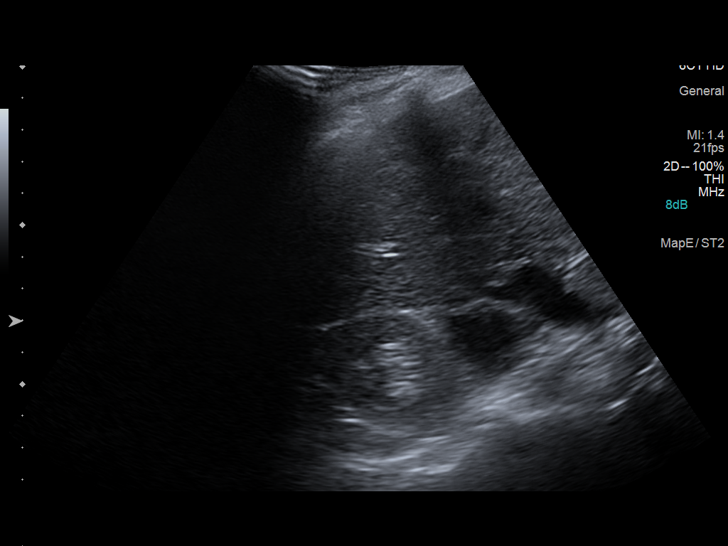
[im 17/40]
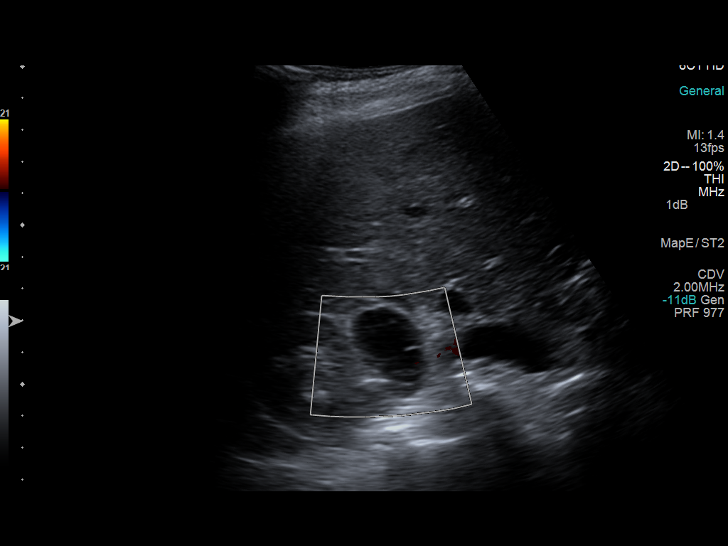
[im 20/40]
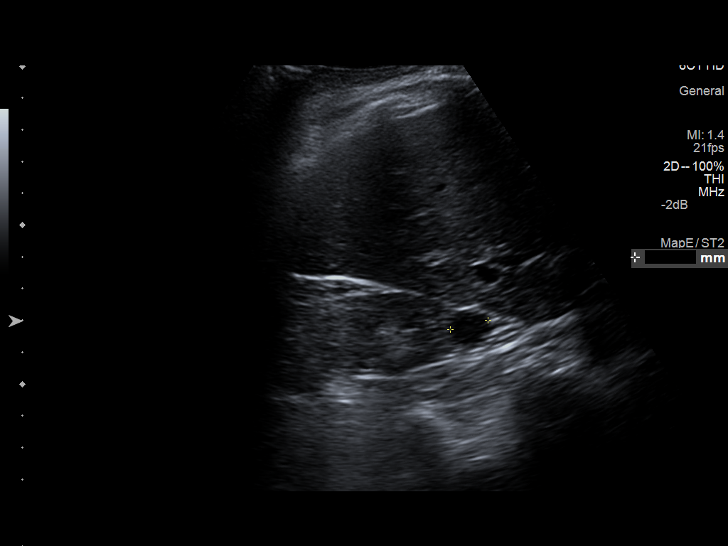
[im 23/40]
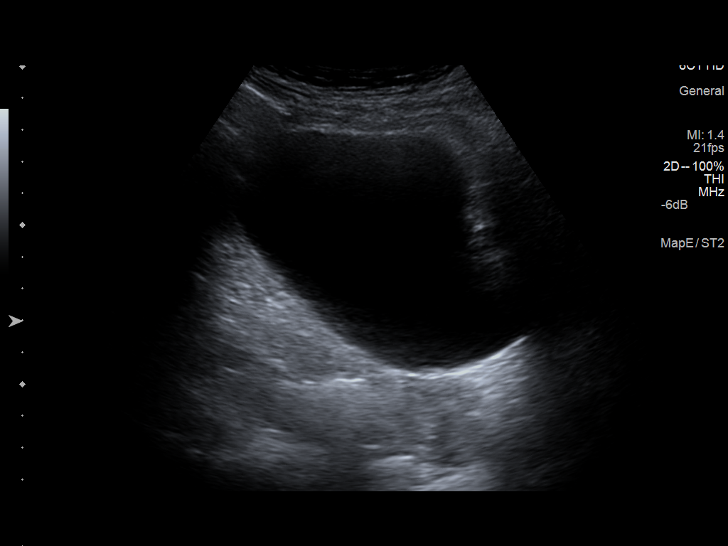
[im 27/40]
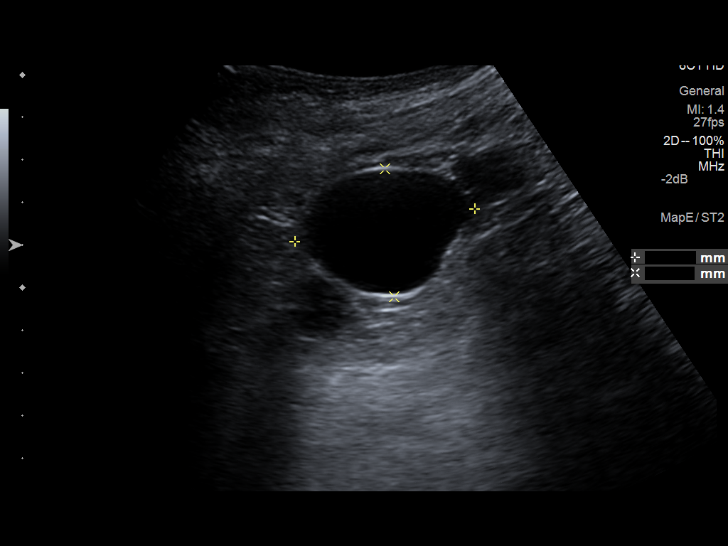
[im 30/40]
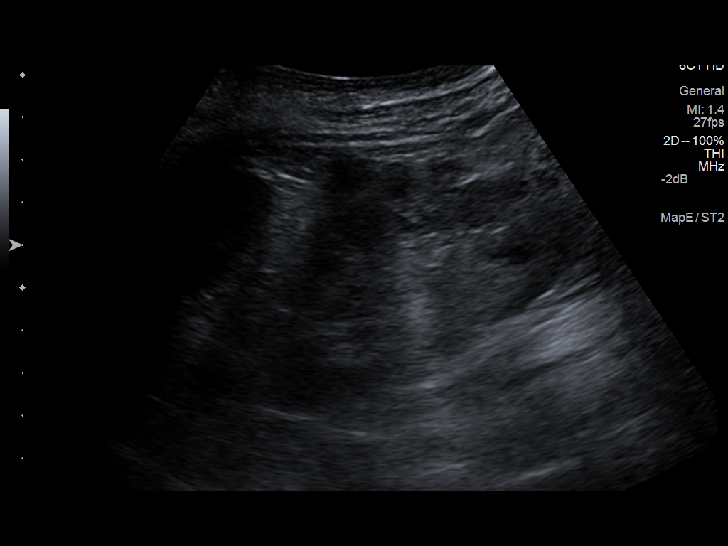
[im 33/40]
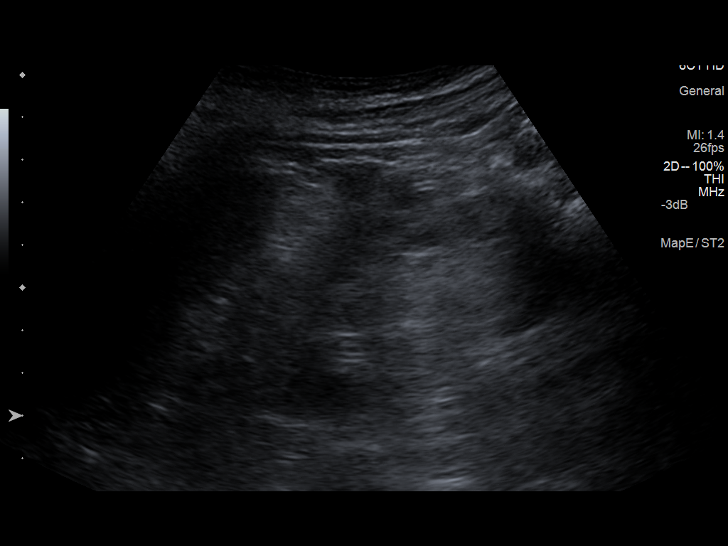
[im 36/40]
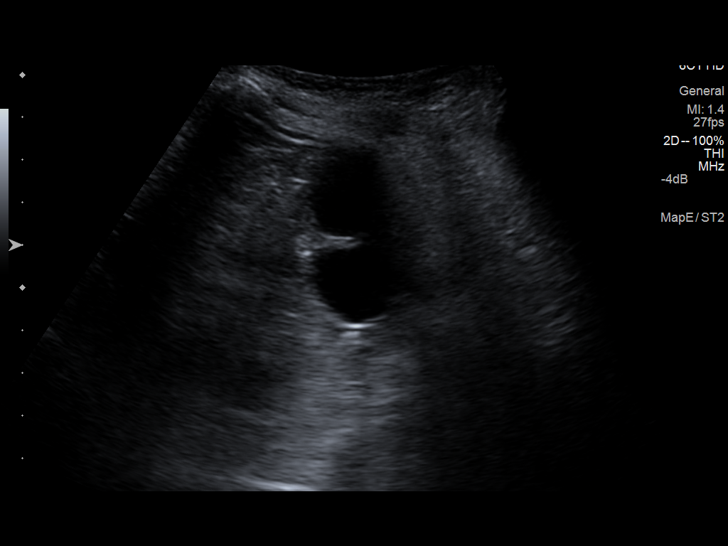
[im 40/40]
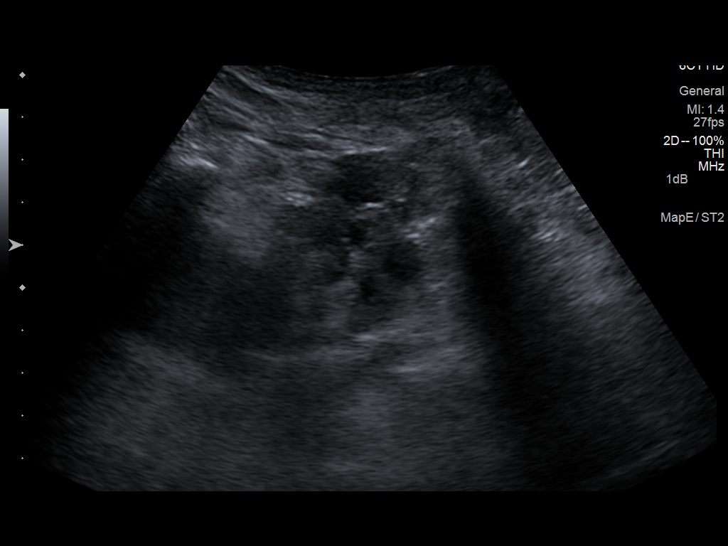

[13 of 25 positions shown; findings below may reference images not displayed]

FINDINGS: Right Kidney:

Length: 9.2 cm. Increased echogenicity of the renal parenchyma
suggest as a component of underlying medical renal disease. 2.1 x
2.7 cm cystic lesion in the interpolar kidney shows multiple
internal septations. This represents the 2.1 x 1.1 cm lesion seen on
the previous CT scan. 1.1 x 1.1 cm round cystic lesion identified at
the extreme inferior pole of the right kidney. This lesion was 8 x 8
mm on the previous CT scan.

No hydronephrosis.

Left Kidney:

Length: 8.9 cm. Multiple cysts are identified in the left kidney, as
seen on the previous CT scan. The largest lesion is 4.3 x 3.0 x
cm and has simple features by ultrasound. Other smaller cystic
lesions towards the lower pole are evident and similar to the
previous CT scan.

No hydronephrosis.

Bladder:

Appears normal for degree of bladder distention.
IMPRESSION: Multiple bilateral renal cysts. A 2.1 cm lesion in the right kidney
shows numerous internal septations, and has increased in size since
03/13/2009. Consider six-month follow-up ultrasound to assess for
further enlargement.

## 2014-08-29 IMAGING — CR DG CHEST 2V
1 series · 1 of 1 positions shown · non-contrast
Comparison: 01/11/2013

CLINICAL DATA: Congestive heart failure.  Pneumonia.

EXAM:
CHEST  2 VIEW

[view not recorded]
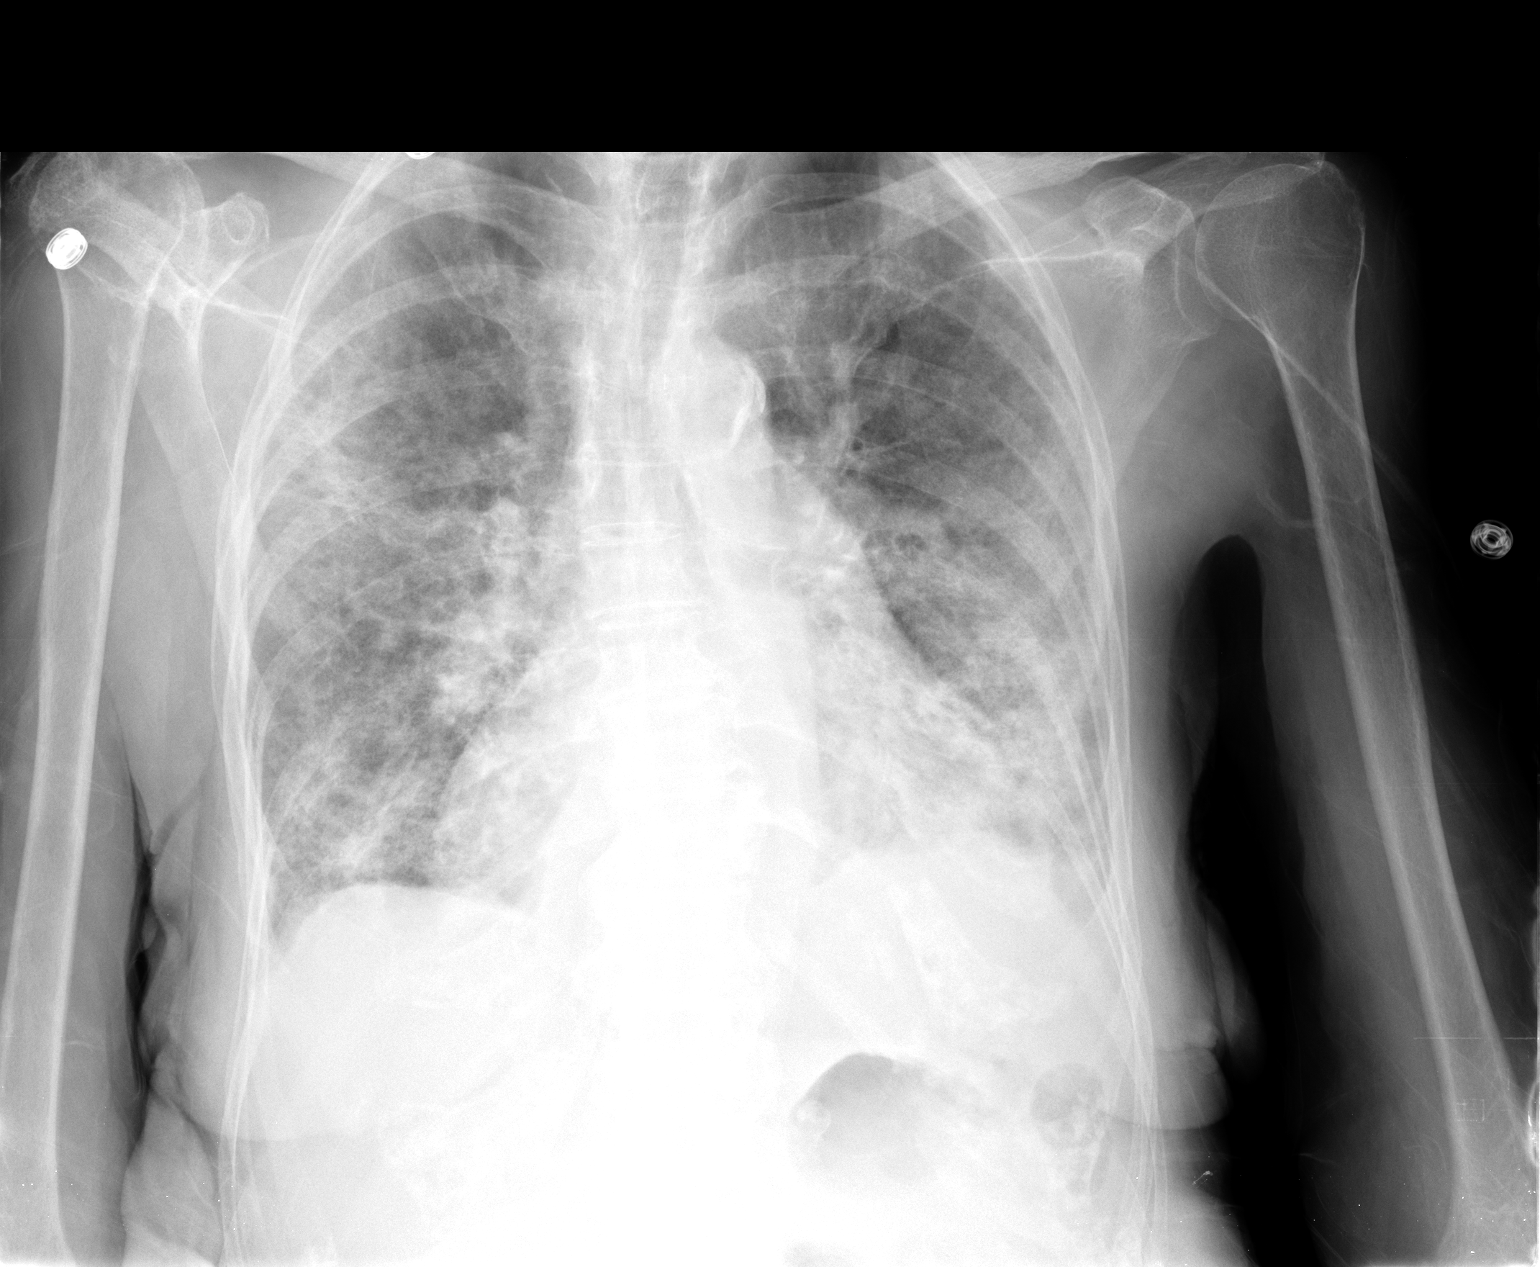

[1 of 1 positions shown; findings below may reference images not displayed]

FINDINGS: Bilateral airspace opacities appear mildly worsened. Peripheral
interstitial accentuation suggests at least a component of
congestive heart failure. Cardiomegaly is present. Atherosclerotic
aortic arch. Thoracic spondylosis.

Severe right glenohumeral arthropathy. Small bilateral pleural
effusions.
IMPRESSION: 1. Mildly worsened bilateral airspace opacities. The cardiomegaly
and peripheral interstitial accentuation suggests that some or all
of the airspace opacities are due to congestive heart failure,
although superimposed pneumonia is difficult to exclude.
2. Stable right glenohumeral arthropathy.
3. Small bilateral pleural effusions.
